# Patient Record
Sex: Male | Born: 1945
Health system: Southern US, Community
[De-identification: ages and names within clinical notes are randomized; demographics above are authoritative.]

## PROBLEM LIST (undated history)

## (undated) DIAGNOSIS — N261 Atrophy of kidney (terminal): Secondary | ICD-10-CM

## (undated) DIAGNOSIS — N200 Calculus of kidney: Secondary | ICD-10-CM

## (undated) DIAGNOSIS — K429 Umbilical hernia without obstruction or gangrene: Secondary | ICD-10-CM

## (undated) DIAGNOSIS — E785 Hyperlipidemia, unspecified: Secondary | ICD-10-CM

## (undated) DIAGNOSIS — I10 Essential (primary) hypertension: Secondary | ICD-10-CM

## (undated) DIAGNOSIS — G4700 Insomnia, unspecified: Secondary | ICD-10-CM

## (undated) DIAGNOSIS — R7303 Prediabetes: Secondary | ICD-10-CM

## (undated) DIAGNOSIS — J449 Chronic obstructive pulmonary disease, unspecified: Secondary | ICD-10-CM

## (undated) DIAGNOSIS — I700 Atherosclerosis of aorta: Secondary | ICD-10-CM

## (undated) DIAGNOSIS — K439 Ventral hernia without obstruction or gangrene: Secondary | ICD-10-CM

## (undated) HISTORY — DX: Prediabetes: R73.03

## (undated) HISTORY — PX: CATARACT EXTRACTION, BILATERAL: SHX1313

## (undated) HISTORY — PX: TONSILLECTOMY AND ADENOIDECTOMY: SUR1326

## (undated) HISTORY — PX: US ECHOCARDIOGRAPHY: HXRAD669

## (undated) HISTORY — DX: Hyperlipidemia, unspecified: E78.5

## (undated) HISTORY — DX: Essential (primary) hypertension: I10

## (undated) HISTORY — DX: Chronic obstructive pulmonary disease, unspecified: J44.9

## (undated) HISTORY — PX: VASECTOMY: SHX75

---

## 2007-07-20 ENCOUNTER — Other Ambulatory Visit: Payer: Self-pay

## 2007-07-20 ENCOUNTER — Inpatient Hospital Stay: Payer: Self-pay | Admitting: Internal Medicine

## 2008-01-25 ENCOUNTER — Ambulatory Visit: Payer: Self-pay | Admitting: Internal Medicine

## 2009-11-01 IMAGING — CT CT CHEST W/ CM
1 series · 16 of 33 positions shown, 20 images · IV contrast (agent unspecified)
Comparison: none

REASON FOR EXAM: Follow-up hemoptysis
COMMENTS:

PROCEDURE:     CT  - CT CHEST WITH CONTRAST  - January 25, 2008  [DATE]
RESULT:     The patient has a history of hemoptysis.
TECHNIQUE: IV contrast enhanced chest CT is obtained.
Comparison is made to prior chest CT of 07/23/2007 and 07/20/2007.

[Series 2: soft tissue · axial · 0.75mm/px · z∈[-774,-414]mm · 16 of 78 slices shown, 20 images]
[im 3/78  mediastinal]
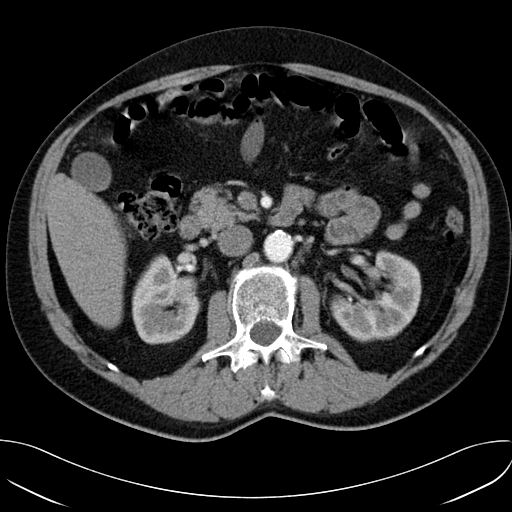
[im 3/78  lung]
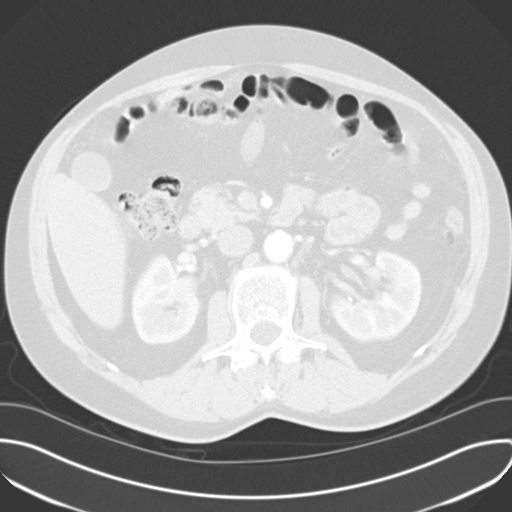
[im 9/78  lung]
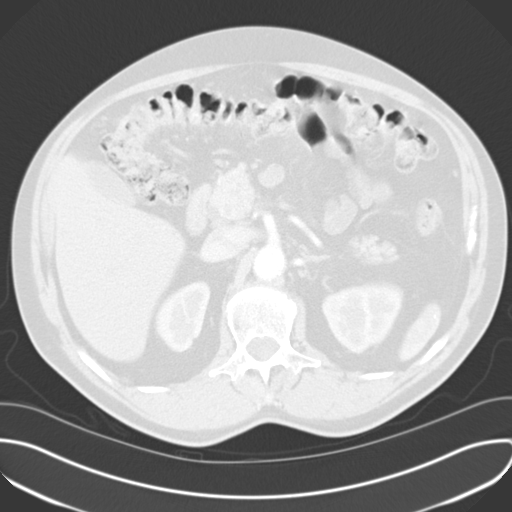
[im 15/78  lung]
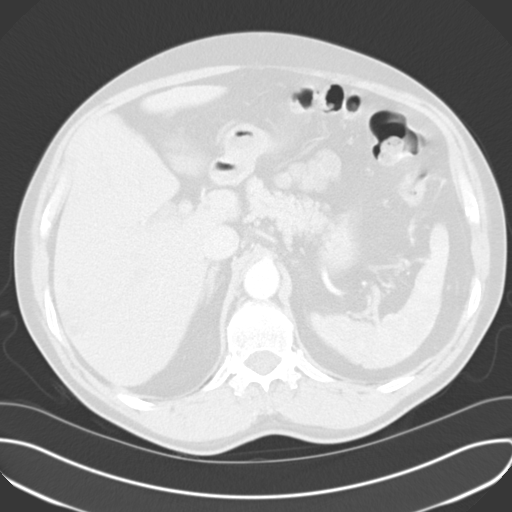
[im 18/78  lung]
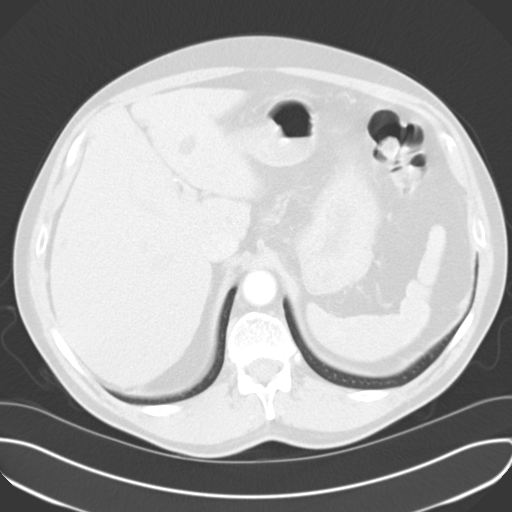
[im 23/78  mediastinal]
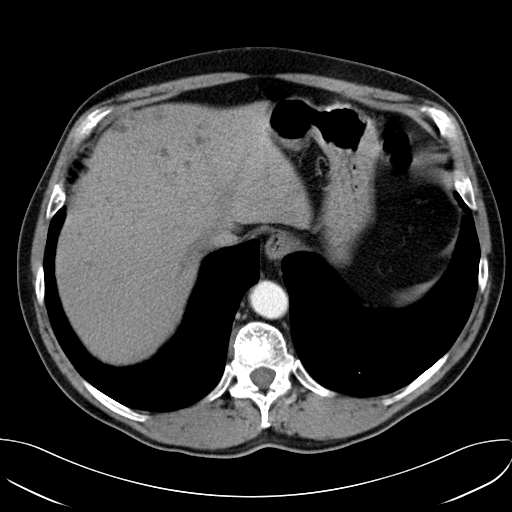
[im 23/78  lung]
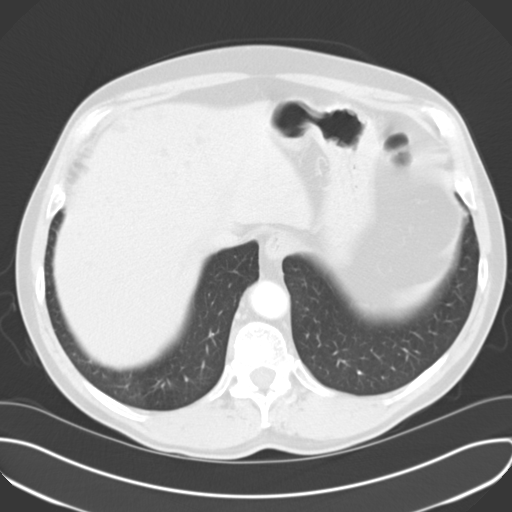
[im 29/78  lung]
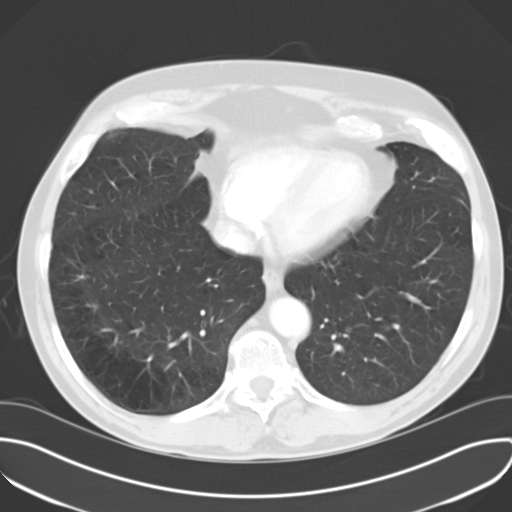
[im 32/78  lung]
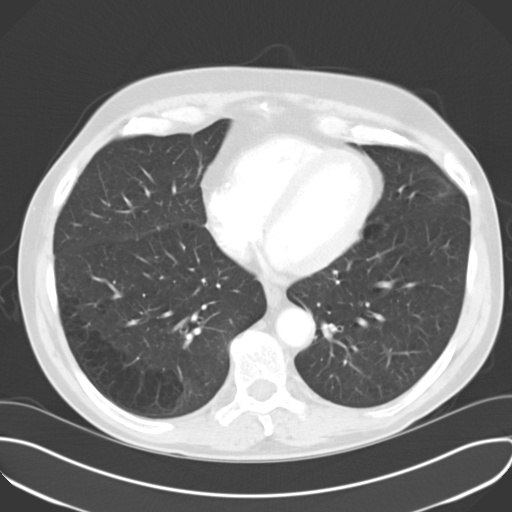
[im 38/78  lung]
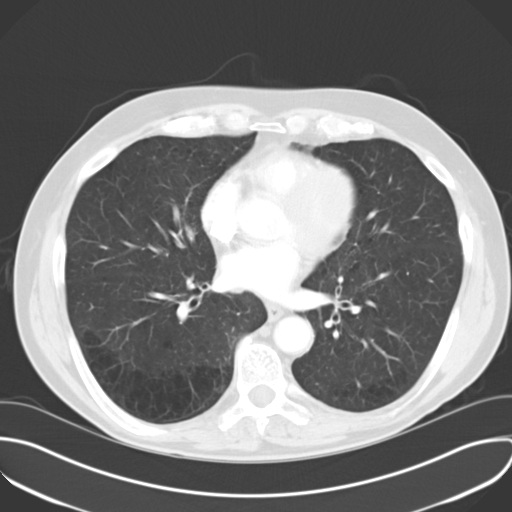
[im 41/78  mediastinal]
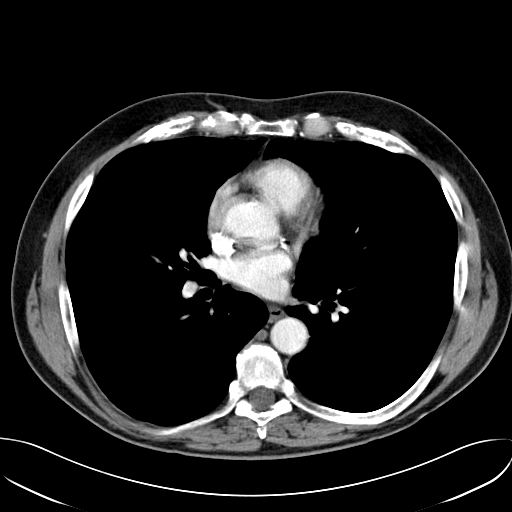
[im 41/78  lung]
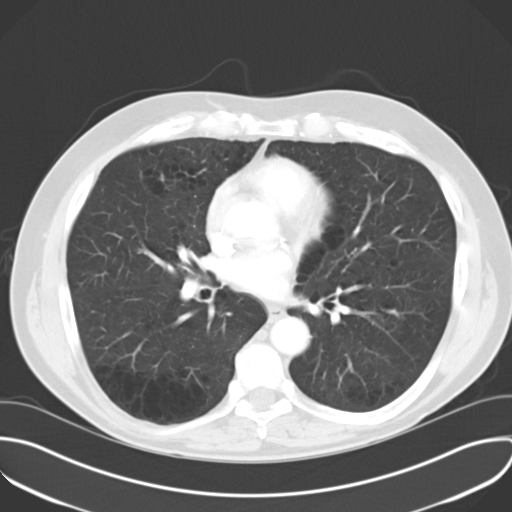
[im 46/78  lung]
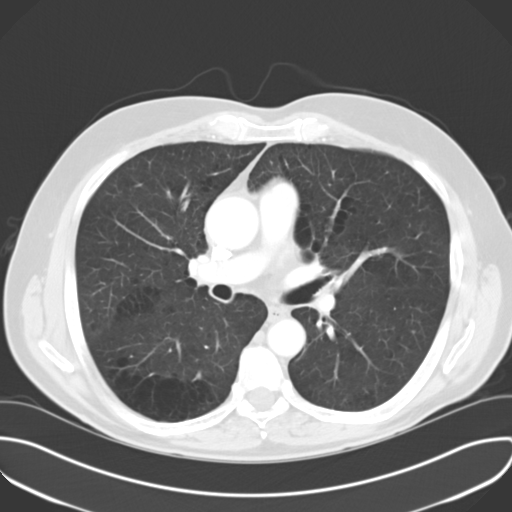
[im 49/78  lung]
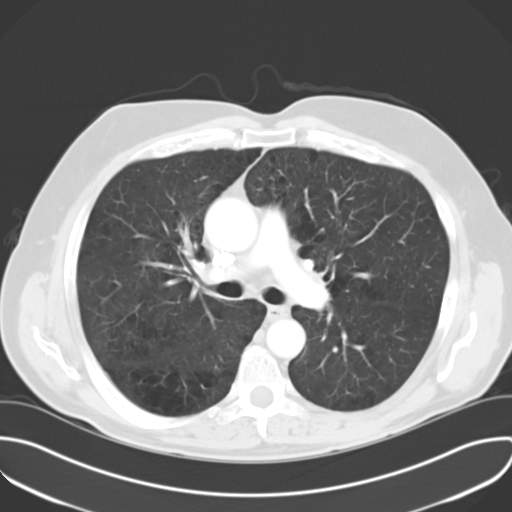
[im 55/78  lung]
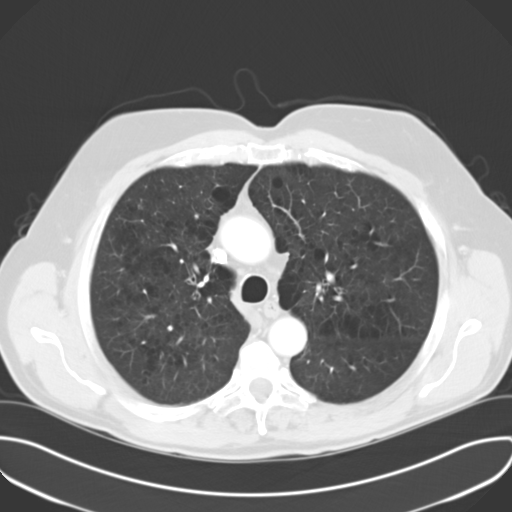
[im 60/78  mediastinal]
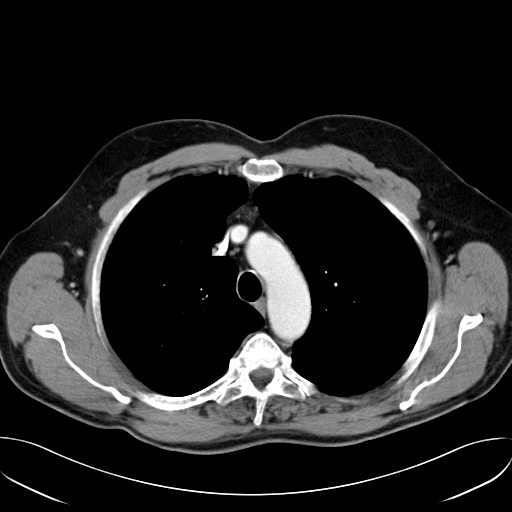
[im 60/78  lung]
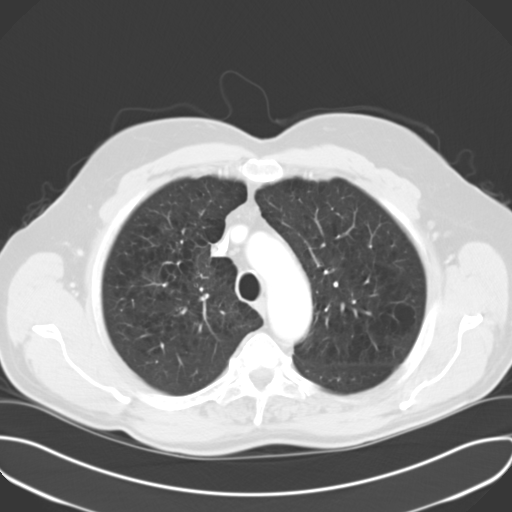
[im 63/78  lung]
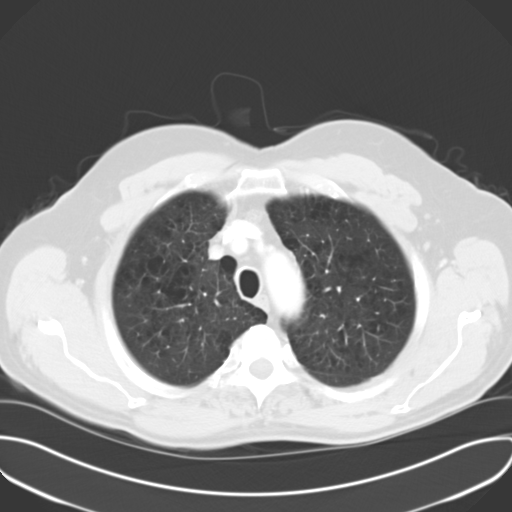
[im 69/78  lung]
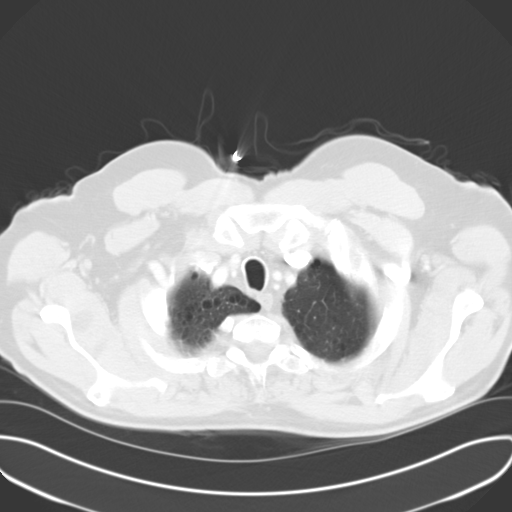
[im 75/78  lung]
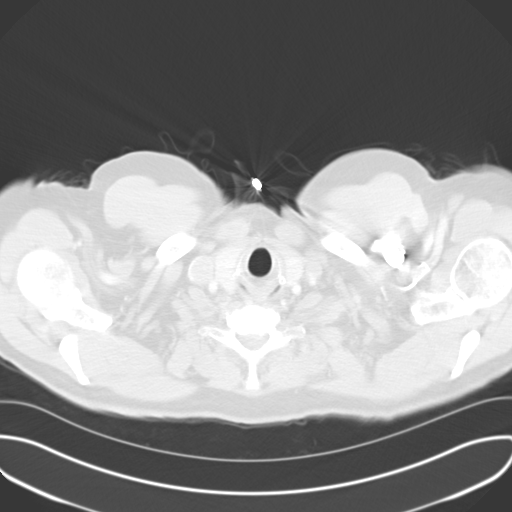

[16 of 33 positions shown; findings below may reference images not displayed]

FINDINGS: The thoracic aorta is unremarkable. Coronary artery disease is
present. The adrenals are normal. Innumerable, hepatic lucencies are again
noted consistent with simple cysts and these are stable. The pulmonary
arteries are normal. There is no evidence of pulmonary embolus. Bullous
changes of COPD are noted. No focal pulmonary mass lesion is noted. Apical
pleural thickening is again noted consistent with scarring. These changes
are stable.
IMPRESSION: 1.     Bullous COPD with apical pleural parenchymal thickening consistent
with scarring and unchanged from prior CT scans in July 2007.
2.     No acute abnormality identified.
3.     Multiple, tiny hepatic cysts are noted.

## 2014-10-11 ENCOUNTER — Ambulatory Visit
Admit: 2014-10-11 | Disposition: A | Discharge status: Home / Self Care | Payer: Self-pay | Attending: Ophthalmology | Admitting: Ophthalmology

## 2014-10-15 ENCOUNTER — Ambulatory Visit
Admit: 2014-10-15 | Disposition: A | Discharge status: Home / Self Care | Payer: Self-pay | Attending: Ophthalmology | Admitting: Ophthalmology

## 2014-10-29 ENCOUNTER — Ambulatory Visit
Admit: 2014-10-29 | Disposition: A | Discharge status: Home / Self Care | Payer: Self-pay | Attending: Ophthalmology | Admitting: Ophthalmology

## 2014-11-11 NOTE — Op Note (Signed)
PATIENT NAME:  Jeff Ryan, Jeff Ryan MR#:  [id number] DATE OF BIRTH:  1945/10/02  DATE OF PROCEDURE:  10/29/2014  PREOPERATIVE DIAGNOSIS:  Nuclear sclerotic cataract, left eye.    POSTOPERATIVE DIAGNOSIS:  Nuclear sclerotic cataract, left eye.    PROCEDURE PERFORMED:  Extracapsular cataract extraction using phacoemulsification with placement of an Alcon SN60WS 20.5-diopter posterior chamber lens, serial number [id number].  SURGEON:  Loura Back. Preciosa Bundrick, MD  ASSISTANT:  None.  ANESTHESIA:  4% lidocaine and 0.75% Marcaine in a 50/50 mixture with 10 units per mL of Hylenex added, given as a peribulbar.  ANESTHESIOLOGIST:  Dr. Marcello Moores.    COMPLICATIONS:  None.  ESTIMATED BLOOD LOSS:  Less than 1 ml.  DESCRIPTION OF PROCEDURE:  The patient was brought to the operating room and given a peribulbar block.  The patient was then prepped and draped in the usual fashion.  The vertical rectus muscles were imbricated using 5-0 silk sutures.  These sutures were then clamped to the sterile drapes as bridle sutures.  A limbal peritomy was performed extending two clock hours and hemostasis was obtained with cautery.  A partial thickness scleral groove was made at the surgical limbus and dissected anteriorly in a lamellar dissection using an Alcon crescent knife.  The anterior chamber was entered supero-temporally with a Superblade and through the lamellar dissection with a 2.6 mm keratome.  DisCoVisc was used to replace the aqueous and a continuous tear capsulorrhexis was carried out.  Hydrodissection and hydrodelineation were carried out with balanced salt and a 27 gauge canula.  The nucleus was rotated to confirm the effectiveness of the hydrodissection.  Phacoemulsification was carried out using a divide-and-conquer technique.  Total ultrasound time was 1 minute and 6.2 seconds with an average power of 26.9 percent. CDE of 29.83.    Irrigation/aspiration was used to remove the residual cortex.  DisCoVisc  was used to inflate the capsule and the internal incision was enlarged to 3 mm with the crescent knife.  The intraocular lens was folded and inserted into the capsular bag using the AcrySert delivery system.  Irrigation/aspiration was used to remove the residual DisCoVisc.  Miostat was injected into the anterior chamber through the paracentesis track to inflate the anterior chamber and induce miosis.  0.1 mL of cefuroxime containing 1 mL of drug was injected via the paracentesis track.  The wound was checked for leaks and none were found. The conjunctiva was closed with cautery and the bridle sutures were removed.  Two drops of 0.3% Vigamox were placed on the eye.   An eye shield was placed on the eye.  The patient was discharged to the recovery room in good condition.   ____________________________ Loura Back Jaree Trinka, MD sad:bu D: 10/29/2014 13:11:44 ET T: 10/29/2014 17:42:04 ET JOB#: [id number]  cc: Remo Lipps A. Lidiya Reise, MD, <Dictator> Martie Lee MD ELECTRONICALLY SIGNED 11/05/2014 13:51

## 2014-11-11 NOTE — Op Note (Signed)
PATIENT NAME:  Jeff Ryan, Jeff Ryan MR#:  [id number] DATE OF BIRTH:  03-11-1946  DATE OF PROCEDURE:  10/15/2014  PREOPERATIVE DIAGNOSIS: Nuclear sclerotic cataract, right eye.   POSTOPERATIVE DIAGNOSIS: Nuclear sclerotic cataract, right eye.   PROCEDURE PERFORMED: Extracapsular cataract extraction using phacoemulsification with placement of Alcon SN6CWS, 21.0 diopter, posterior chamber lens, serial number [id number].  SURGEON: Loura Back. Evanthia Maund, M.D.   ANESTHESIA: 4% lidocaine and 0.75% Marcaine 50-50 mixture with 10 units/mL of Hylenex added given as a peribulbar.   ANESTHESIOLOGIST: Gjibertus F. [place] [employer], M.D.   COMPLICATIONS: None.   ESTIMATED BLOOD LOSS: Less than 1 mL.   DESCRIPTION OF PROCEDURE:  The patient was brought to the operating room and given a peribulbar block.  The patient was then prepped and draped in the usual fashion.  The vertical rectus muscles were imbricated using 5-0 silk sutures.  These sutures were then clamped to the sterile drapes as bridle sutures.  A limbal peritomy was performed extending two clock hours and hemostasis was obtained with cautery.  A partial thickness scleral groove was made at the surgical limbus and dissected anteriorly in a lamellar dissection using an Alcon crescent knife.  The anterior chamber was entered superonasally with a Superblade and through the lamellar dissection with a 2.6 mm keratome.  DisCoVisc was used to replace the aqueous and a continuous tear capsulorrhexis was carried out.  Hydrodissection and hydrodelineation were carried out with balanced salt and a 27 gauge canula.  The nucleus was rotated to confirm the effectiveness of the hydrodissection.  Phacoemulsification was carried out using a divide-and-conquer technique.  Total ultrasound time 1 minute and 44 seconds with an average power of 28.1 percent.  CDE 48.33.    Irrigation/aspiration was used to remove the residual cortex.  DisCoVisc was used to inflate the  capsule and the internal incision was enlarged to 3 mm with the crescent knife.  The intraocular lens was folded and inserted into the capsular bag using the AcrySert delivery system.  Irrigation/aspiration was used to remove the residual DisCoVisc.  Miostat was injected into the anterior chamber through the paracentesis track to inflate the anterior chamber and induce miosis.  1/10 of a mL of cefuroxime containing 1 mg of drug was injected via paracentesis tract. The wound was checked for leaks and none were found. The conjunctiva was closed with cautery and the bridle sutures were removed. An eye shield was placed on the eye.  The patient was discharged to the recovery room in good condition.   __________________________ Loura Back Andraya Frigon, MD sad:sp D: 10/15/2014 09:36:52 ET T: 10/15/2014 10:09:26 ET JOB#: [id number]  cc: Remo Lipps A. Tiera Mensinger, MD, <Dictator> Martie Lee MD ELECTRONICALLY SIGNED 10/22/2014 14:07

## 2015-04-12 ENCOUNTER — Encounter: Payer: Self-pay | Admitting: Family Medicine

## 2015-04-15 ENCOUNTER — Ambulatory Visit (INDEPENDENT_AMBULATORY_CARE_PROVIDER_SITE_OTHER): Payer: Medicare PPO | Admitting: Family Medicine

## 2015-04-15 ENCOUNTER — Encounter: Payer: Self-pay | Admitting: Family Medicine

## 2015-04-15 VITALS — BP 135/70 | HR 60 | Temp 97.5°F | Resp 16 | Ht 71.0 in | Wt 161.0 lb

## 2015-04-15 DIAGNOSIS — I129 Hypertensive chronic kidney disease with stage 1 through stage 4 chronic kidney disease, or unspecified chronic kidney disease: Secondary | ICD-10-CM | POA: Insufficient documentation

## 2015-04-15 DIAGNOSIS — E785 Hyperlipidemia, unspecified: Secondary | ICD-10-CM | POA: Insufficient documentation

## 2015-04-15 DIAGNOSIS — E119 Type 2 diabetes mellitus without complications: Secondary | ICD-10-CM

## 2015-04-15 DIAGNOSIS — I10 Essential (primary) hypertension: Secondary | ICD-10-CM | POA: Diagnosis not present

## 2015-04-15 DIAGNOSIS — Z23 Encounter for immunization: Secondary | ICD-10-CM

## 2015-04-15 LAB — CBC WITH DIFFERENTIAL/PLATELET
BASOS ABS: 0 10*3/uL (ref 0.0–0.2)
Basos: 1 %
EOS (ABSOLUTE): 0.1 10*3/uL (ref 0.0–0.4)
Eos: 1 %
Hematocrit: 40.2 % (ref 37.5–51.0)
Hemoglobin: 13.3 g/dL (ref 12.6–17.7)
Immature Grans (Abs): 0 10*3/uL (ref 0.0–0.1)
Immature Granulocytes: 0 %
LYMPHS ABS: 2.3 10*3/uL (ref 0.7–3.1)
Lymphs: 34 %
MCH: 31.3 pg (ref 26.6–33.0)
MCHC: 33.1 g/dL (ref 31.5–35.7)
MCV: 95 fL (ref 79–97)
MONOS ABS: 0.5 10*3/uL (ref 0.1–0.9)
Monocytes: 8 %
NEUTROS ABS: 3.9 10*3/uL (ref 1.4–7.0)
Neutrophils: 56 %
PLATELETS: 256 10*3/uL (ref 150–379)
RBC: 4.25 x10E6/uL (ref 4.14–5.80)
RDW: 14.5 % (ref 12.3–15.4)
WBC: 6.9 10*3/uL (ref 3.4–10.8)

## 2015-04-15 LAB — POCT GLYCOSYLATED HEMOGLOBIN (HGB A1C)

## 2015-04-15 NOTE — Patient Instructions (Signed)
Continue current meds 

## 2015-04-15 NOTE — Progress Notes (Signed)
Name: Jeff Ryan   MRN: [id number]    DOB: 07-Mar-1946   Date:04/15/2015       Progress Note  Subjective  Chief Complaint  Chief Complaint  Patient presents with  . Hypertension  . Diabetes  . Hyperlipidemia    HPI Here for f/u of diet controlled DM, HBP, hypercholesterol.  Takng meds   Eating well controlled diet, mainly vgetables.  No c/o.  No problem-specific assessment & plan notes found for this encounter.   Past Medical History  Diagnosis Date  . COPD (chronic obstructive pulmonary disease) ([place])   . Dyslipidemia   . Essential hypertension   . Pre-diabetes     Social History  Substance Use Topics  . Smoking status: Former Smoker    Types: Cigarettes  . Smokeless tobacco: Never Used  . Alcohol Use: 0.0 oz/week    0 Standard drinks or equivalent per week     Comment: beer x2 weekly     Current outpatient prescriptions:  .  lisinopril (PRINIVIL,ZESTRIL) 40 MG tablet, Take 40 mg by mouth daily., Disp: , Rfl:  .  simvastatin (ZOCOR) 40 MG tablet, Take 40 mg by mouth daily., Disp: , Rfl:   No Known Allergies  Review of Systems  Constitutional: Negative for fever and chills.  HENT: Negative for hearing loss.   Eyes: Negative for blurred vision and double vision.  Respiratory: Negative for cough, shortness of breath and wheezing.   Cardiovascular: Negative for chest pain, palpitations and leg swelling.  Gastrointestinal: Negative for heartburn, abdominal pain and blood in stool.  Genitourinary: Negative for dysuria, urgency and frequency.  Musculoskeletal: Negative for myalgias and joint pain.  Skin: Negative for rash.  Neurological: Negative for dizziness, sensory change, focal weakness and headaches.      Objective  Filed Vitals:   04/15/15 0828  BP: 147/83  Pulse: 48  Temp: 97.5 F (36.4 C)  TempSrc: Oral  Resp: 16  Height: 5\' 11"  (1.803 m)  Weight: 161 lb (73.029 kg)     Physical Exam  Constitutional: He is oriented to person, place,  and time. No distress.  HENT:  Head: Normocephalic and atraumatic.  Eyes: Conjunctivae and EOM are normal. Pupils are equal, round, and reactive to light. No scleral icterus.  Neck: Normal range of motion. Neck supple. Carotid bruit is not present. No thyromegaly present.  Cardiovascular: Normal rate, regular rhythm, normal heart sounds and intact distal pulses.  Exam reveals no gallop and no friction rub.   No murmur heard. Pulmonary/Chest: Effort normal and breath sounds normal. No respiratory distress. He has no wheezes. He has no rales.  Abdominal: Soft. Bowel sounds are normal. He exhibits no distension, no abdominal bruit and no mass. There is no tenderness.  Musculoskeletal: Normal range of motion. He exhibits no edema.  Lymphadenopathy:    He has no cervical adenopathy.  Neurological: He is alert and oriented to person, place, and time.  Vitals reviewed.     Recent Results (from the past 2160 hour(s))  POCT HgB A1C     Status: Normal   Collection Time: 04/15/15  8:58 AM  Result Value Ref Range   Hemoglobin A1C 5.4%      Assessment & Plan  1. Type 2 diabetes mellitus without complication, without long-term current use of insulin (HCC) -diet controlled  - POCT HgB A1C -5.4 - Comprehensive Metabolic Panel (CMET) - CBC with Differential  2. Need for influenza vaccination  - Flu vaccine HIGH DOSE PF (Fluzone High dose)  3.  Type 2 diabetes, diet controlled ([place])   4. Essential hypertension   5. Hyperlipidemia  - Lipid Profile

## 2015-04-16 LAB — COMPREHENSIVE METABOLIC PANEL
A/G RATIO: 1.7 (ref 1.1–2.5)
ALT: 17 IU/L (ref 0–44)
AST: 21 IU/L (ref 0–40)
Albumin: 4.3 g/dL (ref 3.6–4.8)
Alkaline Phosphatase: 51 IU/L (ref 39–117)
BILIRUBIN TOTAL: 0.4 mg/dL (ref 0.0–1.2)
BUN/Creatinine Ratio: 26 — ABNORMAL HIGH (ref 10–22)
BUN: 19 mg/dL (ref 8–27)
CHLORIDE: 101 mmol/L (ref 97–108)
CO2: 27 mmol/L (ref 18–29)
Calcium: 9.1 mg/dL (ref 8.6–10.2)
Creatinine, Ser: 0.74 mg/dL — ABNORMAL LOW (ref 0.76–1.27)
GFR calc non Af Amer: 94 mL/min/{1.73_m2} (ref >59)
GFR, EST AFRICAN AMERICAN: 109 mL/min/{1.73_m2} (ref >59)
GLUCOSE: 102 mg/dL — AB (ref 65–99)
Globulin, Total: 2.6 g/dL (ref 1.5–4.5)
Potassium: 5.1 mmol/L (ref 3.5–5.2)
Sodium: 140 mmol/L (ref 134–144)
TOTAL PROTEIN: 6.9 g/dL (ref 6.0–8.5)

## 2015-04-16 LAB — LIPID PANEL
Chol/HDL Ratio: 2.3 ratio units (ref 0.0–5.0)
Cholesterol, Total: 144 mg/dL (ref 100–199)
HDL: 63 mg/dL (ref >39)
LDL Calculated: 70 mg/dL (ref 0–99)
TRIGLYCERIDES: 57 mg/dL (ref 0–149)
VLDL Cholesterol Cal: 11 mg/dL (ref 5–40)

## 2015-08-19 ENCOUNTER — Encounter: Payer: Self-pay | Admitting: Family Medicine

## 2015-08-19 ENCOUNTER — Ambulatory Visit (INDEPENDENT_AMBULATORY_CARE_PROVIDER_SITE_OTHER): Payer: Medicare Other | Admitting: Family Medicine

## 2015-08-19 VITALS — BP 135/70 | HR 49 | Temp 97.6°F | Resp 16 | Ht 71.0 in | Wt 164.4 lb

## 2015-08-19 DIAGNOSIS — I10 Essential (primary) hypertension: Secondary | ICD-10-CM

## 2015-08-19 DIAGNOSIS — E785 Hyperlipidemia, unspecified: Secondary | ICD-10-CM | POA: Diagnosis not present

## 2015-08-19 DIAGNOSIS — R7303 Prediabetes: Secondary | ICD-10-CM

## 2015-08-19 DIAGNOSIS — E119 Type 2 diabetes mellitus without complications: Secondary | ICD-10-CM

## 2015-08-19 LAB — POCT GLYCOSYLATED HEMOGLOBIN (HGB A1C): HEMOGLOBIN A1C: 5.5

## 2015-08-19 MED ORDER — SIMVASTATIN 40 MG PO TABS: 40.0000 mg | ORAL_TABLET | Freq: Every day | ORAL | Status: DC

## 2015-08-19 MED ORDER — LISINOPRIL 40 MG PO TABS: 40.0000 mg | ORAL_TABLET | Freq: Every day | ORAL | Status: DC

## 2015-08-19 NOTE — Progress Notes (Signed)
Name: Jeff Ryan   MRN: [id number]    DOB: 06/02/1946   Date:08/19/2015       Progress Note  Subjective  Chief Complaint  Chief Complaint  Patient presents with  . Hypertension    HPI  Here for f/u of HBP.  Has elevated lipios.  Has diet controlled DM.  He feels well without C/o. No problem-specific assessment & plan notes found for this encounter.   Past Medical History  Diagnosis Date  . COPD (chronic obstructive pulmonary disease) ([place])   . Dyslipidemia   . Essential hypertension   . Pre-diabetes     Past Surgical History  Procedure Laterality Date  . Tonsillectomy and adenoidectomy    . Vasectomy    . Cataract extraction, bilateral      left 10/26/2014 right 10/12/2014  . US echocardiography      10/2014    Family History  Problem Relation Age of Onset  . Heart disease Brother     Social History   Social History  . Marital Status: Married    Spouse Name: N/A  . Number of Children: N/A  . Years of Education: N/A   Occupational History  . Not on file.   Social History Main Topics  . Smoking status: Former Smoker    Types: Cigarettes  . Smokeless tobacco: Never Used  . Alcohol Use: 0.0 oz/week    0 Standard drinks or equivalent per week     Comment: beer x2 weekly  . Drug Use: No  . Sexual Activity: Not on file   Other Topics Concern  . Not on file   Social History Narrative     Current outpatient prescriptions:  .  lisinopril (PRINIVIL,ZESTRIL) 40 MG tablet, Take 40 mg by mouth daily., Disp: , Rfl:  .  simvastatin (ZOCOR) 40 MG tablet, Take 40 mg by mouth daily., Disp: , Rfl:   No Known Allergies   Review of Systems  Constitutional: Negative for fever, chills, weight loss and malaise/fatigue.  HENT: Negative for hearing loss.   Eyes: Negative for blurred vision and double vision.  Respiratory: Negative for cough, shortness of breath and wheezing.   Cardiovascular: Negative for chest pain, palpitations and leg swelling.   Gastrointestinal: Negative for heartburn, abdominal pain and blood in stool.  Genitourinary: Negative for dysuria, urgency and frequency.  Musculoskeletal: Negative for joint pain (bilateral shoulders and L knee).  Skin: Negative for rash.  Neurological: Negative for dizziness, tremors, weakness and headaches.      Objective  Filed Vitals:   08/19/15 0814 08/19/15 0844  BP: 162/88 135/70  Pulse: 49   Temp: 97.6 F (36.4 C)   TempSrc: Oral   Resp: 16   Height: 5\' 11"  (1.803 m)   Weight: 164 lb 6.4 oz (74.571 kg)   SpO2: 100%     Physical Exam  Constitutional: He is oriented to person, place, and time and well-developed, well-nourished, and in no distress. No distress.  HENT:  Head: Normocephalic and atraumatic.  Eyes: Conjunctivae and EOM are normal. Pupils are equal, round, and reactive to light. No scleral icterus.  Neck: Normal range of motion. Neck supple. Carotid bruit is not present. No thyromegaly present.  Cardiovascular: Normal rate, regular rhythm and normal heart sounds.  Exam reveals no gallop and no friction rub.   No murmur heard. Pulmonary/Chest: Effort normal and breath sounds normal. No respiratory distress. He has no wheezes. He has no rales.  Abdominal: Soft. Bowel sounds are normal. He exhibits no distension,  no abdominal bruit and no mass. There is no tenderness.  Musculoskeletal: He exhibits no edema.  Lymphadenopathy:    He has no cervical adenopathy.  Neurological: He is alert and oriented to person, place, and time.  Vitals reviewed.      Recent Results (from the past 2160 hour(s))  POCT HgB A1C     Status: Normal   Collection Time: 08/19/15  8:32 AM  Result Value Ref Range   Hemoglobin A1C 5.5      Assessment & Plan  Problem List Items Addressed This Visit      Cardiovascular and Mediastinum   HBP (high blood pressure)     Endocrine   Type 2 diabetes, diet controlled ([place])     Other   Hyperlipidemia    Other Visit Diagnoses     Prediabetes    -  Primary    Relevant Orders    POCT HgB A1C (Completed)       No orders of the defined types were placed in this encounter.   1. Prediabetes  - POCT HgB A1C-5.5  2. Essential hypertension -cont med  3. Type 2 diabetes, diet controlled (HCC) Cont. Diet  4. Hyperlipidemia  Cont med

## 2016-02-17 ENCOUNTER — Ambulatory Visit (INDEPENDENT_AMBULATORY_CARE_PROVIDER_SITE_OTHER): Payer: Medicare Other | Admitting: Family Medicine

## 2016-02-17 ENCOUNTER — Encounter: Payer: Self-pay | Admitting: Family Medicine

## 2016-02-17 VITALS — BP 140/65 | HR 54 | Temp 98.3°F | Ht 71.0 in | Wt 172.0 lb

## 2016-02-17 DIAGNOSIS — E785 Hyperlipidemia, unspecified: Secondary | ICD-10-CM | POA: Diagnosis not present

## 2016-02-17 DIAGNOSIS — I10 Essential (primary) hypertension: Secondary | ICD-10-CM | POA: Diagnosis not present

## 2016-02-17 LAB — CBC WITH DIFFERENTIAL/PLATELET
Basophils Absolute: 54 cells/uL (ref 0–200)
Basophils Relative: 1 %
EOS PCT: 1 %
Eosinophils Absolute: 54 cells/uL (ref 15–500)
HEMATOCRIT: 40.2 % (ref 38.5–50.0)
Hemoglobin: 13.2 g/dL (ref 13.2–17.1)
LYMPHS ABS: 2052 {cells}/uL (ref 850–3900)
LYMPHS PCT: 38 %
MCH: 31.7 pg (ref 27.0–33.0)
MCHC: 32.8 g/dL (ref 32.0–36.0)
MCV: 96.4 fL (ref 80.0–100.0)
MONO ABS: 486 {cells}/uL (ref 200–950)
MPV: 11 fL (ref 7.5–12.5)
Monocytes Relative: 9 %
Neutro Abs: 2754 cells/uL (ref 1500–7800)
Neutrophils Relative %: 51 %
Platelets: 270 10*3/uL (ref 140–400)
RBC: 4.17 MIL/uL — AB (ref 4.20–5.80)
RDW: 13.9 % (ref 11.0–15.0)
WBC: 5.4 10*3/uL (ref 3.8–10.8)

## 2016-02-17 MED ORDER — SIMVASTATIN 40 MG PO TABS: 40.0000 mg | ORAL_TABLET | Freq: Every day | ORAL | 3 refills | Status: DC

## 2016-02-17 MED ORDER — LISINOPRIL 40 MG PO TABS: 40.0000 mg | ORAL_TABLET | Freq: Every day | ORAL | 3 refills | Status: DC

## 2016-02-17 NOTE — Progress Notes (Signed)
Name: Jeff Ryan   MRN: [id number]    DOB: 04/20/1946   Date:02/17/2016       Progress Note  Subjective  Chief Complaint  Chief Complaint  Patient presents with  . Hypertension  . Hyperlipidemia    HPI Here for f/u of HBP and elevated lipids.  BPs at home 120-125/70-75.  He feels well.  Very active.    No problem-specific Assessment & Plan notes found for this encounter.   Past Medical History:  Diagnosis Date  . COPD (chronic obstructive pulmonary disease) ([place])   . Dyslipidemia   . Essential hypertension   . Pre-diabetes     Past Surgical History:  Procedure Laterality Date  . CATARACT EXTRACTION, BILATERAL     left 10/26/2014 right 10/12/2014  . TONSILLECTOMY AND ADENOIDECTOMY    . US ECHOCARDIOGRAPHY     10/2014  . VASECTOMY      Family History  Problem Relation Age of Onset  . Heart disease Brother     Social History   Social History  . Marital status: Married    Spouse name: N/A  . Number of children: N/A  . Years of education: N/A   Occupational History  . Not on file.   Social History Main Topics  . Smoking status: Former Smoker    Types: Cigarettes  . Smokeless tobacco: Never Used  . Alcohol use 0.0 oz/week     Comment: beer x2 weekly  . Drug use: No  . Sexual activity: Not on file   Other Topics Concern  . Not on file   Social History Narrative  . No narrative on file     Current Outpatient Prescriptions:  .  lisinopril (PRINIVIL,ZESTRIL) 40 MG tablet, Take 1 tablet (40 mg total) by mouth daily., Disp: 90 tablet, Rfl: 3 .  simvastatin (ZOCOR) 40 MG tablet, Take 1 tablet (40 mg total) by mouth daily., Disp: 90 tablet, Rfl: 3  Not on File   Review of Systems  Constitutional: Negative for chills, fever, malaise/fatigue and weight loss.  HENT: Negative for hearing loss.   Eyes: Negative for blurred vision and double vision.  Respiratory: Negative for cough, shortness of breath and wheezing.   Cardiovascular: Negative for chest  pain, palpitations and leg swelling.  Gastrointestinal: Negative for abdominal pain, blood in stool and heartburn.  Genitourinary: Negative for dysuria, frequency and urgency.  Musculoskeletal: Negative for joint pain and myalgias.  Skin: Negative for itching and rash.  Neurological: Negative for dizziness, tremors, weakness and headaches.  Psychiatric/Behavioral: Negative for depression. The patient is not nervous/anxious and does not have insomnia.       Objective  Vitals:   02/17/16 0813 02/17/16 0835  BP: (!) 161/91 140/65  Pulse: (!) 54   Temp: 98.3 F (36.8 C)   TempSrc: Oral   Weight: 172 lb (78 kg)   Height: 5\' 11"  (1.803 m)     Physical Exam  Constitutional: He is oriented to person, place, and time and well-developed, well-nourished, and in no distress. No distress.  HENT:  Head: Normocephalic and atraumatic.  Eyes: Conjunctivae and EOM are normal. Pupils are equal, round, and reactive to light. No scleral icterus.  Neck: Normal range of motion. Neck supple. Carotid bruit is not present. No thyromegaly present.  Cardiovascular: Normal rate, regular rhythm and normal heart sounds.  Exam reveals no gallop and no friction rub.   No murmur heard. Pulmonary/Chest: Effort normal and breath sounds normal. No respiratory distress. He has no wheezes. He has  no rales.  Abdominal: Soft. Bowel sounds are normal. He exhibits no distension, no abdominal bruit and no mass. There is no tenderness.  Musculoskeletal: He exhibits no edema.  Lymphadenopathy:    He has no cervical adenopathy.  Neurological: He is alert and oriented to person, place, and time.  Vitals reviewed.      No results found for this or any previous visit (from the past 2160 hour(s)).   Assessment & Plan  Problem List Items Addressed This Visit      Cardiovascular and Mediastinum   HBP (high blood pressure) - Primary   Relevant Medications   simvastatin (ZOCOR) 40 MG tablet   lisinopril  (PRINIVIL,ZESTRIL) 40 MG tablet   Other Relevant Orders   COMPLETE METABOLIC PANEL WITH GFR   CBC with Differential     Other   Hyperlipidemia   Relevant Medications   simvastatin (ZOCOR) 40 MG tablet   lisinopril (PRINIVIL,ZESTRIL) 40 MG tablet   Other Relevant Orders   Lipid Profile    Other Visit Diagnoses   None.     Meds ordered this encounter  Medications  . simvastatin (ZOCOR) 40 MG tablet    Sig: Take 1 tablet (40 mg total) by mouth daily.    Dispense:  90 tablet    Refill:  3  . lisinopril (PRINIVIL,ZESTRIL) 40 MG tablet    Sig: Take 1 tablet (40 mg total) by mouth daily.    Dispense:  90 tablet    Refill:  3   1. Hyperlipidemia  - simvastatin (ZOCOR) 40 MG tablet; Take 1 tablet (40 mg total) by mouth daily.  Dispense: 90 tablet; Refill: 3 - Lipid Profile  2. Essential hypertension  - lisinopril (PRINIVIL,ZESTRIL) 40 MG tablet; Take 1 tablet (40 mg total) by mouth daily.  Dispense: 90 tablet; Refill: 3 - COMPLETE METABOLIC PANEL WITH GFR - CBC with Differential

## 2016-02-18 ENCOUNTER — Other Ambulatory Visit: Payer: Self-pay | Admitting: *Deleted

## 2016-02-18 DIAGNOSIS — E785 Hyperlipidemia, unspecified: Secondary | ICD-10-CM

## 2016-02-18 DIAGNOSIS — I10 Essential (primary) hypertension: Secondary | ICD-10-CM

## 2016-02-18 LAB — COMPLETE METABOLIC PANEL WITH GFR
ALBUMIN: 4.1 g/dL (ref 3.6–5.1)
ALK PHOS: 41 U/L (ref 40–115)
ALT: 13 U/L (ref 9–46)
AST: 17 U/L (ref 10–35)
BILIRUBIN TOTAL: 0.6 mg/dL (ref 0.2–1.2)
BUN: 26 mg/dL — AB (ref 7–25)
CO2: 28 mmol/L (ref 20–31)
Calcium: 9.3 mg/dL (ref 8.6–10.3)
Chloride: 106 mmol/L (ref 98–110)
Creat: 0.67 mg/dL — ABNORMAL LOW (ref 0.70–1.18)
GFR, Est African American: >89 mL/min (ref >=60)
GFR, Est Non African American: >89 mL/min (ref >=60)
GLUCOSE: 104 mg/dL — AB (ref 65–99)
Potassium: 5.5 mmol/L — ABNORMAL HIGH (ref 3.5–5.3)
SODIUM: 140 mmol/L (ref 135–146)
Total Protein: 6.3 g/dL (ref 6.1–8.1)

## 2016-02-18 LAB — LIPID PANEL
CHOL/HDL RATIO: 2 ratio (ref <=5.0)
Cholesterol: 145 mg/dL (ref 125–200)
HDL: 74 mg/dL (ref >=40)
LDL CALC: 61 mg/dL (ref <130)
Triglycerides: 50 mg/dL (ref <150)
VLDL: 10 mg/dL (ref <30)

## 2016-02-18 MED ORDER — SIMVASTATIN 40 MG PO TABS: 40.0000 mg | ORAL_TABLET | Freq: Every day | ORAL | 3 refills | Status: DC

## 2016-02-18 MED ORDER — HYDROCHLOROTHIAZIDE 12.5 MG PO TABS: 12.5000 mg | ORAL_TABLET | Freq: Every day | ORAL | 6 refills | Status: DC

## 2016-02-18 MED ORDER — LISINOPRIL 40 MG PO TABS: 40.0000 mg | ORAL_TABLET | Freq: Every day | ORAL | 3 refills | Status: DC

## 2016-02-18 NOTE — Progress Notes (Unsigned)
.  h 

## 2016-03-20 ENCOUNTER — Other Ambulatory Visit: Payer: Medicare PPO

## 2016-03-20 ENCOUNTER — Other Ambulatory Visit: Payer: Self-pay | Admitting: Family Medicine

## 2016-03-20 DIAGNOSIS — R748 Abnormal levels of other serum enzymes: Secondary | ICD-10-CM

## 2016-03-20 DIAGNOSIS — R899 Unspecified abnormal finding in specimens from other organs, systems and tissues: Secondary | ICD-10-CM | POA: Diagnosis not present

## 2016-03-20 LAB — BASIC METABOLIC PANEL
BUN: 22 mg/dL (ref 7–25)
CHLORIDE: 100 mmol/L (ref 98–110)
CO2: 30 mmol/L (ref 20–31)
Calcium: 9.6 mg/dL (ref 8.6–10.3)
Creat: 0.81 mg/dL (ref 0.70–1.18)
GLUCOSE: 109 mg/dL — AB (ref 65–99)
POTASSIUM: 5 mmol/L (ref 3.5–5.3)
SODIUM: 138 mmol/L (ref 135–146)

## 2016-04-09 ENCOUNTER — Ambulatory Visit (INDEPENDENT_AMBULATORY_CARE_PROVIDER_SITE_OTHER): Payer: Medicare Other

## 2016-04-09 DIAGNOSIS — Z23 Encounter for immunization: Secondary | ICD-10-CM | POA: Diagnosis not present

## 2016-07-08 DIAGNOSIS — H40003 Preglaucoma, unspecified, bilateral: Secondary | ICD-10-CM | POA: Diagnosis not present

## 2016-08-20 ENCOUNTER — Ambulatory Visit (INDEPENDENT_AMBULATORY_CARE_PROVIDER_SITE_OTHER): Payer: Medicare Other | Admitting: Family Medicine

## 2016-08-20 ENCOUNTER — Encounter: Payer: Self-pay | Admitting: Family Medicine

## 2016-08-20 ENCOUNTER — Other Ambulatory Visit: Payer: Self-pay | Admitting: Family Medicine

## 2016-08-20 VITALS — BP 120/60 | HR 59 | Temp 98.3°F | Resp 16 | Ht 71.0 in | Wt 175.0 lb

## 2016-08-20 DIAGNOSIS — E7849 Other hyperlipidemia: Secondary | ICD-10-CM

## 2016-08-20 DIAGNOSIS — E782 Mixed hyperlipidemia: Secondary | ICD-10-CM | POA: Diagnosis not present

## 2016-08-20 DIAGNOSIS — E784 Other hyperlipidemia: Secondary | ICD-10-CM

## 2016-08-20 DIAGNOSIS — I10 Essential (primary) hypertension: Secondary | ICD-10-CM | POA: Diagnosis not present

## 2016-08-20 DIAGNOSIS — R739 Hyperglycemia, unspecified: Secondary | ICD-10-CM | POA: Diagnosis not present

## 2016-08-20 MED ORDER — SIMVASTATIN 40 MG PO TABS: 40.0000 mg | ORAL_TABLET | Freq: Every day | ORAL | 3 refills | Status: DC

## 2016-08-20 MED ORDER — LISINOPRIL 40 MG PO TABS: 40.0000 mg | ORAL_TABLET | Freq: Every day | ORAL | 3 refills | Status: DC

## 2016-08-20 NOTE — Addendum Note (Signed)
Addended by: Devona Konig on: 08/20/2016 08:55 AM   Modules accepted: Orders

## 2016-08-20 NOTE — Progress Notes (Signed)
Name: Nigil Barile   MRN: [id number]    DOB: Apr 06, 1946   Date:08/20/2016       Progress Note  Subjective  Chief Complaint  Chief Complaint  Patient presents with  . Hypertension    HPI Here for f/u of HBP and elevated lipids.  He is taking meds.  He feels well and has no c/o.  No problem-specific Assessment & Plan notes found for this encounter.   Past Medical History:  Diagnosis Date  . COPD (chronic obstructive pulmonary disease) ([place])   . Dyslipidemia   . Essential hypertension   . Pre-diabetes     Past Surgical History:  Procedure Laterality Date  . CATARACT EXTRACTION, BILATERAL     left 10/26/2014 right 10/12/2014  . TONSILLECTOMY AND ADENOIDECTOMY    . US ECHOCARDIOGRAPHY     10/2014  . VASECTOMY      Family History  Problem Relation Age of Onset  . Heart disease Brother     Social History   Social History  . Marital status: Married    Spouse name: N/A  . Number of children: N/A  . Years of education: N/A   Occupational History  . Not on file.   Social History Main Topics  . Smoking status: Former Smoker    Types: Cigarettes  . Smokeless tobacco: Never Used  . Alcohol use 0.0 oz/week     Comment: beer x2 weekly  . Drug use: No  . Sexual activity: Not on file   Other Topics Concern  . Not on file   Social History Narrative  . No narrative on file     Current Outpatient Prescriptions:  .  lisinopril (PRINIVIL,ZESTRIL) 40 MG tablet, Take 1 tablet (40 mg total) by mouth daily., Disp: 90 tablet, Rfl: 3 .  simvastatin (ZOCOR) 40 MG tablet, Take 1 tablet (40 mg total) by mouth daily., Disp: 90 tablet, Rfl: 3  Not on File   Review of Systems  Constitutional: Negative for chills, fever, malaise/fatigue and weight loss.  HENT: Negative for hearing loss and tinnitus.   Eyes: Negative for blurred vision and double vision.  Respiratory: Negative for cough, shortness of breath and wheezing.   Cardiovascular: Negative for chest pain,  palpitations and leg swelling.  Gastrointestinal: Negative for abdominal pain, blood in stool and heartburn.  Genitourinary: Negative for dysuria, frequency and urgency.  Musculoskeletal: Negative for joint pain and myalgias.  Skin: Negative for rash.  Neurological: Negative for dizziness, tingling, weakness and headaches.  Psychiatric/Behavioral: Negative.       Objective  Vitals:   08/20/16 0817 08/20/16 0842  BP: 124/72 120/60  Pulse: (!) 59   Resp: 16   Temp: 98.3 F (36.8 C)   TempSrc: Oral   Weight: 175 lb (79.4 kg)   Height: 5\' 11"  (1.803 m)     Physical Exam  Constitutional: He is oriented to person, place, and time and well-developed, well-nourished, and in no distress. No distress.  HENT:  Head: Normocephalic and atraumatic.  Eyes: Conjunctivae and EOM are normal. Pupils are equal, round, and reactive to light. No scleral icterus.  Neck: Normal range of motion. Neck supple. Carotid bruit is not present. No thyromegaly present.  Cardiovascular: Normal rate, regular rhythm and normal heart sounds.  Exam reveals no gallop and no friction rub.   No murmur heard. Pulmonary/Chest: Effort normal and breath sounds normal. No respiratory distress. He has no wheezes. He has no rales.  Abdominal: Soft. Bowel sounds are normal. He exhibits no distension  and no mass. There is no tenderness.  Musculoskeletal: He exhibits no edema.  Lymphadenopathy:    He has no cervical adenopathy.  Neurological: He is alert and oriented to person, place, and time.  Vitals reviewed.      No results found for this or any previous visit (from the past 2160 hour(s)).   Assessment & Plan  Problem List Items Addressed This Visit      Cardiovascular and Mediastinum   HBP (high blood pressure) - Primary   Relevant Medications   lisinopril (PRINIVIL,ZESTRIL) 40 MG tablet   simvastatin (ZOCOR) 40 MG tablet   Other Relevant Orders   CBC with Differential   Comprehensive Metabolic Panel  (CMET)     Other   Hyperlipidemia   Relevant Medications   lisinopril (PRINIVIL,ZESTRIL) 40 MG tablet   simvastatin (ZOCOR) 40 MG tablet   Other Relevant Orders   Lipid Profile   Elevated blood sugar   Relevant Orders   HgB A1c      Meds ordered this encounter  Medications  . lisinopril (PRINIVIL,ZESTRIL) 40 MG tablet    Sig: Take 1 tablet (40 mg total) by mouth daily.    Dispense:  90 tablet    Refill:  3  . simvastatin (ZOCOR) 40 MG tablet    Sig: Take 1 tablet (40 mg total) by mouth daily.    Dispense:  90 tablet    Refill:  3   1. Essential hypertension  - CBC with Differential - Comprehensive Metabolic Panel (CMET) - lisinopril (PRINIVIL,ZESTRIL) 40 MG tablet; Take 1 tablet (40 mg total) by mouth daily.  Dispense: 90 tablet; Refill: 3  2. Mixed hyperlipidemia  - Lipid Profile  3. Elevated blood sugar  - HgB A1c  4. Other hyperlipidemia  - simvastatin (ZOCOR) 40 MG tablet; Take 1 tablet (40 mg total) by mouth daily.  Dispense: 90 tablet; Refill: 3

## 2016-08-21 LAB — COMPREHENSIVE METABOLIC PANEL
ALK PHOS: 45 IU/L (ref 39–117)
ALT: 16 IU/L (ref 0–44)
AST: 25 IU/L (ref 0–40)
Albumin/Globulin Ratio: 1.6 (ref 1.2–2.2)
Albumin: 4.5 g/dL (ref 3.5–4.8)
BUN/Creatinine Ratio: 16 (ref 10–24)
BUN: 14 mg/dL (ref 8–27)
Bilirubin Total: 0.6 mg/dL (ref 0.0–1.2)
CALCIUM: 9.3 mg/dL (ref 8.6–10.2)
CO2: 25 mmol/L (ref 18–29)
CREATININE: 0.88 mg/dL (ref 0.76–1.27)
Chloride: 99 mmol/L (ref 96–106)
GFR calc Af Amer: 101 mL/min/{1.73_m2} (ref >59)
GFR calc non Af Amer: 87 mL/min/{1.73_m2} (ref >59)
GLOBULIN, TOTAL: 2.9 g/dL (ref 1.5–4.5)
GLUCOSE: 106 mg/dL — AB (ref 65–99)
Potassium: 4.5 mmol/L (ref 3.5–5.2)
SODIUM: 140 mmol/L (ref 134–144)
Total Protein: 7.4 g/dL (ref 6.0–8.5)

## 2016-08-21 LAB — CBC WITH DIFFERENTIAL/PLATELET
BASOS ABS: 0 10*3/uL (ref 0.0–0.2)
Basos: 1 %
EOS (ABSOLUTE): 0.1 10*3/uL (ref 0.0–0.4)
Eos: 2 %
HEMATOCRIT: 40 % (ref 37.5–51.0)
Hemoglobin: 14 g/dL (ref 13.0–17.7)
IMMATURE GRANULOCYTES: 0 %
Immature Grans (Abs): 0 10*3/uL (ref 0.0–0.1)
LYMPHS ABS: 2.4 10*3/uL (ref 0.7–3.1)
Lymphs: 38 %
MCH: 32.8 pg (ref 26.6–33.0)
MCHC: 35 g/dL (ref 31.5–35.7)
MCV: 94 fL (ref 79–97)
MONOS ABS: 0.7 10*3/uL (ref 0.1–0.9)
Monocytes: 11 %
NEUTROS PCT: 48 %
Neutrophils Absolute: 3.2 10*3/uL (ref 1.4–7.0)
Platelets: 251 10*3/uL (ref 150–379)
RBC: 4.27 x10E6/uL (ref 4.14–5.80)
RDW: 13.3 % (ref 12.3–15.4)
WBC: 6.4 10*3/uL (ref 3.4–10.8)

## 2016-08-21 LAB — HGB A1C W/O EAG: HEMOGLOBIN A1C: 5.5 % (ref 4.8–5.6)

## 2017-01-05 ENCOUNTER — Telehealth: Payer: Self-pay | Admitting: Family Medicine

## 2017-01-05 NOTE — Telephone Encounter (Signed)
Called pt to schedule Annual Wellness Visit with NHA  - knb  °

## 2017-01-26 ENCOUNTER — Telehealth: Payer: Self-pay | Admitting: Family Medicine

## 2017-01-26 NOTE — Telephone Encounter (Signed)
Called pt to schedule Annual Wellness Visit with NHA  - knb  °

## 2017-02-09 ENCOUNTER — Telehealth: Payer: Self-pay | Admitting: Family Medicine

## 2017-02-09 NOTE — Telephone Encounter (Signed)
Called pt to schedule for Annual Wellness Visit with Nurse Health Advisor, Tiffany Hill, my c/b # is [phone]  Kathryn Brown ° °

## 2017-03-02 ENCOUNTER — Encounter: Payer: Self-pay | Admitting: Family Medicine

## 2017-03-02 ENCOUNTER — Ambulatory Visit (INDEPENDENT_AMBULATORY_CARE_PROVIDER_SITE_OTHER): Payer: Medicare Other | Admitting: Family Medicine

## 2017-03-02 ENCOUNTER — Other Ambulatory Visit: Payer: Self-pay | Admitting: Family Medicine

## 2017-03-02 VITALS — BP 114/70 | HR 65 | Temp 98.5°F | Resp 16 | Ht 71.0 in | Wt 178.2 lb

## 2017-03-02 DIAGNOSIS — I10 Essential (primary) hypertension: Secondary | ICD-10-CM | POA: Diagnosis not present

## 2017-03-02 DIAGNOSIS — Z1159 Encounter for screening for other viral diseases: Secondary | ICD-10-CM

## 2017-03-02 DIAGNOSIS — E782 Mixed hyperlipidemia: Secondary | ICD-10-CM

## 2017-03-02 DIAGNOSIS — R739 Hyperglycemia, unspecified: Secondary | ICD-10-CM

## 2017-03-02 DIAGNOSIS — R7989 Other specified abnormal findings of blood chemistry: Secondary | ICD-10-CM

## 2017-03-02 DIAGNOSIS — Z87891 Personal history of nicotine dependence: Secondary | ICD-10-CM | POA: Insufficient documentation

## 2017-03-02 DIAGNOSIS — Z1211 Encounter for screening for malignant neoplasm of colon: Secondary | ICD-10-CM | POA: Insufficient documentation

## 2017-03-02 DIAGNOSIS — Z125 Encounter for screening for malignant neoplasm of prostate: Secondary | ICD-10-CM

## 2017-03-02 LAB — POCT GLYCOSYLATED HEMOGLOBIN (HGB A1C): HEMOGLOBIN A1C: 5.5 (ref ?–5.7)

## 2017-03-02 MED ORDER — ASPIRIN EC 81 MG PO TBEC: 81.0000 mg | DELAYED_RELEASE_TABLET | Freq: Every day | ORAL | Status: DC

## 2017-03-02 MED ORDER — OMEGA 3 1000 MG PO CAPS: 1000.0000 mg | ORAL_CAPSULE | Freq: Three times a day (TID) | ORAL | Status: DC

## 2017-03-02 NOTE — Patient Instructions (Addendum)
Thank you for coming to the clinic today.  1. Keep up the great work!  2. A1c 5.5, unchanged from last time.  Colon Cancer Screening: - For all adults age 71+ routine colon cancer screening is highly recommended.     - Recent guidelines from [place] recommend starting age of 63 - Early detection of colon cancer is important, because often there are no warning signs or symptoms, also if found early usually it can be cured. Late stage is hard to treat.  - If you are not interested in Colonoscopy screening (if done and normal you could be cleared for 5 to 10 years until next due), then Cologuard is an excellent alternative for screening test for Colon Cancer. It is highly sensitive for detecting DNA of colon cancer from even the earliest stages. Also, there is NO bowel prep required. - If Cologuard is NEGATIVE, then it is good for 3 years before next due - If Cologuard is POSITIVE, then it is strongly advised to get a Colonoscopy, which allows the GI doctor to locate the source of the cancer or polyp (even very early stage) and treat it by removing it. ------------------------- If you would like to proceed with Cologuard (stool DNA test) - FIRST, call your insurance company and tell them you want to check cost of Cologuard tell them CPT Code [phone] (it may be completely covered and you could get for no cost, OR max cost without any coverage is about $600). Also, keep in mind if you do NOT open the kit, and decide not to do the test, you will NOT be charged, you should contact the company if you decide not to do the test. - If you want to proceed, you can notify us (phone message, [place], or at next visit) and we will order it for you. The test kit will be delivered to you house within about 1 week. Follow instructions to collect sample, you may call the company for any help or questions, 24/7 telephone support at [phone].   DUE for FASTING BLOOD WORK (no food or drink  after midnight before the lab appointment, only water or coffee without cream/sugar on the morning of)  SCHEDULE "Lab Only" visit in the morning at the clinic for lab draw in 6 MONTHS  - Make sure Lab Only appointment is at about 1 week before your next appointment, so that results will be available  Please schedule a Follow-up Appointment to: Return in about 6 months (around 09/02/2017) for Medicare Physical CPE.  If you have any other questions or concerns, please feel free to call the clinic or send a message through [place]. You may also schedule an earlier appointment if necessary.  Additionally, you may be receiving a survey about your experience at our clinic within a few days to 1 week by e-mail or mail. We value your feedback.  Nobie Putnam, DO [place]

## 2017-03-02 NOTE — Assessment & Plan Note (Signed)
Well-controlled HTN - Home BP readings normal  No known complications  Plan:  1. Continue current BP regimen - Lisinopril 40mg  daily 2. Encourage improved lifestyle - low sodium diet, regular exercise 3. Continue monitor BP outside office, bring readings to next visit, if persistently >140/90 or new symptoms notify office sooner 4. Follow-up 6 months - labs + Medicare physical

## 2017-03-02 NOTE — Progress Notes (Signed)
Subjective:    Patient ID: Jeff Ryan, male    DOB: Nov 27, 1945, 71 y.o.   MRN: [id number]  Jeff Ryan is a 71 y.o. male presenting on 03/02/2017 for Hypertension (6 month follow up)   HPI   History of Elevated Blood Sugar Reports no concerns, except prior history of elevated A1c to 5.5 and mild elevated glucose 102-108 in past. CBGs: Not checking. Meds: Never on meds Currently on ACEi Lifestyle: - Diet (tries to balance weight, limits sugars and carbs, eats shelled peanuts, salad for lunch, does have sweet for dessert at night with sugar baby candies)  - Exercise (3 miles walking 7 days a week, treadmill for 1 hour, still mows 2 yards with push mower) Denies hypoglycemia, polyuria, visual changes, numbness or tingling.  CHRONIC HTN: Reports checks BP at home occasionally has electronic cuff, last reading 120s/60s Current Meds - Lisinopril 40mg    Reports good compliance, took meds today. Tolerating well, w/o complaints. - reports he has known chronic history abnormal heart valve or flutter sound, was initially identified by prior PCP Dr Luan Pulling, was told chronic, had normal ECHO in past. Denies CP, dyspnea, HA, edema, dizziness / lightheadedness  HYPERLIPIDEMIA: - Reports no concerns. 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 Sleeps well 7:30pm and wakes up at 3:30am, well rested  - [place]  Health Maintenance: - UTD Pneumonia vaccines, from old system - Due for Flu Shot,  will return in Fall 2018 for this - Due for colon cancer screening, had prior negative FOBT, will return in future for likely cologuard, handout given - Due for prostate cancer screening, with next labs - Due for routine Hep C screening  Social History  Substance Use Topics  . Smoking status: Former Smoker    Types: Cigarettes    Quit date: 01/11/2007  . Smokeless tobacco: Never Used  . Alcohol use 0.0 oz/week     Comment: beer x2 weekly    Review of Systems Per HPI unless specifically indicated above     Objective:    BP 114/70 (BP Location: Left Arm, Cuff Size: Normal)   Pulse 65   Temp 98.5 F (36.9 C) (Oral)   Resp 16   Ht 5\' 11"  (1.803 m)   Wt 178 lb 3.2 oz (80.8 kg)   BMI 24.85 kg/m   Wt Readings from Last 3 Encounters:  03/02/17 178 lb 3.2 oz (80.8 kg)  08/20/16 175 lb (79.4 kg)  02/17/16 172 lb (78 kg)    Physical Exam  Constitutional: He is oriented to person, place, and time. He appears well-developed and well-nourished. No distress.  Well-appearing, comfortable, cooperative  HENT:  Head: Normocephalic and atraumatic.  Mouth/Throat: Oropharynx is clear and moist.  Eyes: Conjunctivae are normal. Right eye exhibits no discharge. Left eye exhibits no discharge.  Neck: Normal range of motion. Neck supple. No thyromegaly present.  Cardiovascular: Normal rate, regular rhythm, normal heart sounds and intact distal pulses.   No murmur heard. Faint valvular flutter heard, reportedly chronic  Pulmonary/Chest: Effort normal and breath sounds normal. No respiratory distress. He has no wheezes. He has no rales.  Musculoskeletal: Normal range of motion. He exhibits no edema.  Lymphadenopathy:    He has no cervical adenopathy.  Neurological: He is alert and oriented to person, place, and time.  Skin: Skin is warm and dry. No rash noted. He is not diaphoretic. No erythema.  Psychiatric: He has a normal mood and affect. His behavior is normal.  Well groomed, good eye  contact, normal speech and thoughts  Nursing note and vitals reviewed.    Results for orders placed or performed in visit on 03/02/17  POCT glycosylated hemoglobin (Hb A1C)  Result Value Ref Range   Hemoglobin A1C 5.5 5.7      Assessment & Plan:   Problem List Items Addressed This Visit    Hyperlipidemia    Controlled cholesterol on statin and lifestyle Last lipid panel 02/2016, normal Calculated ASCVD 10 yr risk score elevated based on age Fam history MI CAD brother  Plan: 1. Continue current meds - Simvastatin 40mg  daily 2. Continue ASA 81mg  for primary ASCVD risk reduction 3. Encourage improved lifestyle - low carb/cholesterol, reduce portion size, continue improving regular exercise 4. Follow-up 6 months labs, lipids      Relevant Medications   aspirin EC 81 MG tablet   Essential hypertension - Primary    Well-controlled HTN - Home BP readings normal  No known complications  Plan:  1. Continue current BP regimen - Lisinopril 40mg  daily 2. Encourage improved lifestyle - low sodium diet, regular exercise 3. Continue monitor BP outside office, bring readings to next visit, if persistently >140/90 or new symptoms notify office sooner 4. Follow-up 6 months - labs + Medicare physical      Relevant Medications   aspirin EC 81 MG tablet   Elevated blood sugar    Mild elevated glucose A1c 5.5, stable unchanged, never dx PreDM  Plan:  1. Not on any therapy currently 2. Encourage improved lifestyle - low carb, low sugar diet, reduce portion size, continue improving regular exercise 3. Follow-up 6 months labs + A1c      Relevant Orders   POCT glycosylated hemoglobin (Hb A1C) (Completed)      Meds ordered this encounter  Medications  . aspirin EC 81 MG tablet    Sig: Take 1 tablet (81 mg total) by mouth daily.  . Omega 3 1000 MG CAPS    Sig: Take 1 capsule (1,000 mg total) by mouth 3 (three) times daily.    Dispense:  90 each    Follow up plan: Return in about 6  months (around 09/02/2017) for Medicare Physical CPE.  Nobie Putnam, [facility] 03/02/2017, 9:04 AM

## 2017-03-02 NOTE — Assessment & Plan Note (Signed)
Controlled cholesterol on statin and lifestyle Last lipid panel 02/2016, normal Calculated ASCVD 10 yr risk score elevated based on age Fam history MI CAD brother  Plan: 1. Continue current meds - Simvastatin 40mg  daily 2. Continue ASA 81mg  for primary ASCVD risk reduction 3. Encourage improved lifestyle - low carb/cholesterol, reduce portion size, continue improving regular exercise 4. Follow-up 6 months labs, lipids

## 2017-03-02 NOTE — Assessment & Plan Note (Signed)
Mild elevated glucose A1c 5.5, stable unchanged, never dx PreDM  Plan:  1. Not on any therapy currently 2. Encourage improved lifestyle - low carb, low sugar diet, reduce portion size, continue improving regular exercise 3. Follow-up 6 months labs + A1c

## 2017-03-02 NOTE — Assessment & Plan Note (Signed)
Due for routine colon cancer screening. Never had colonoscopy (not interested), no family history colon cancer. - Discussion today about recommendations for either Colonoscopy or Cologuard screening, benefits and risks of screening, interested in Cologuard, understands that if positive then recommendation is for diagnostic colonoscopy to follow-up.  - Patient advised to contact insurance first to learn cost, will notify us when ready for us to order Cologuard 

## 2017-03-02 NOTE — Assessment & Plan Note (Signed)
Remains tobacco free >10 years

## 2017-03-29 LAB — FECAL OCCULT BLOOD, GUAIAC: Fecal Occult Blood: NEGATIVE

## 2017-04-04 ENCOUNTER — Encounter: Payer: Self-pay | Admitting: Family Medicine

## 2017-04-06 ENCOUNTER — Ambulatory Visit (INDEPENDENT_AMBULATORY_CARE_PROVIDER_SITE_OTHER): Payer: Medicare Other

## 2017-04-06 DIAGNOSIS — Z23 Encounter for immunization: Secondary | ICD-10-CM | POA: Diagnosis not present

## 2017-06-17 DIAGNOSIS — Z961 Presence of intraocular lens: Secondary | ICD-10-CM | POA: Diagnosis not present

## 2017-06-23 ENCOUNTER — Other Ambulatory Visit: Payer: Self-pay

## 2017-06-23 DIAGNOSIS — I10 Essential (primary) hypertension: Secondary | ICD-10-CM

## 2017-06-23 DIAGNOSIS — E7849 Other hyperlipidemia: Secondary | ICD-10-CM

## 2017-06-23 MED ORDER — SIMVASTATIN 40 MG PO TABS: 40.0000 mg | ORAL_TABLET | Freq: Every day | ORAL | 3 refills | Status: DC

## 2017-06-23 MED ORDER — LISINOPRIL 40 MG PO TABS: 40.0000 mg | ORAL_TABLET | Freq: Every day | ORAL | 3 refills | Status: DC

## 2017-06-24 ENCOUNTER — Telehealth: Payer: Self-pay

## 2017-06-24 NOTE — Telephone Encounter (Signed)
His Rx send yesterday to [facility].

## 2017-06-24 NOTE — Telephone Encounter (Signed)
[facility] faxed refill request 06/22/17: 90 day supply  Lisinopril 40mg   Zocor 40mg 

## 2017-09-01 ENCOUNTER — Other Ambulatory Visit: Payer: Medicare Other

## 2017-09-01 DIAGNOSIS — Z125 Encounter for screening for malignant neoplasm of prostate: Secondary | ICD-10-CM

## 2017-09-01 DIAGNOSIS — E782 Mixed hyperlipidemia: Secondary | ICD-10-CM

## 2017-09-01 DIAGNOSIS — R7989 Other specified abnormal findings of blood chemistry: Secondary | ICD-10-CM

## 2017-09-01 DIAGNOSIS — R739 Hyperglycemia, unspecified: Secondary | ICD-10-CM

## 2017-09-01 DIAGNOSIS — I10 Essential (primary) hypertension: Secondary | ICD-10-CM

## 2017-09-01 DIAGNOSIS — Z1159 Encounter for screening for other viral diseases: Secondary | ICD-10-CM | POA: Diagnosis not present

## 2017-09-01 NOTE — Addendum Note (Signed)
Addended by: Thomes Lolling on: 09/01/2017 08:00 AM   Modules accepted: Orders

## 2017-09-02 LAB — COMPLETE METABOLIC PANEL WITH GFR
AG RATIO: 1.9 (calc) (ref 1.0–2.5)
ALT: 15 U/L (ref 9–46)
AST: 19 U/L (ref 10–35)
Albumin: 4.4 g/dL (ref 3.6–5.1)
Alkaline phosphatase (APISO): 39 U/L — ABNORMAL LOW (ref 40–115)
BUN: 15 mg/dL (ref 7–25)
CALCIUM: 9.4 mg/dL (ref 8.6–10.3)
CO2: 28 mmol/L (ref 20–32)
Chloride: 106 mmol/L (ref 98–110)
Creat: 0.86 mg/dL (ref 0.70–1.18)
GFR, EST AFRICAN AMERICAN: 101 mL/min/{1.73_m2} (ref >=60)
GFR, EST NON AFRICAN AMERICAN: 87 mL/min/{1.73_m2} (ref >=60)
GLUCOSE: 101 mg/dL — AB (ref 65–99)
Globulin: 2.3 g/dL (calc) (ref 1.9–3.7)
POTASSIUM: 4.6 mmol/L (ref 3.5–5.3)
Sodium: 140 mmol/L (ref 135–146)
TOTAL PROTEIN: 6.7 g/dL (ref 6.1–8.1)
Total Bilirubin: 0.6 mg/dL (ref 0.2–1.2)

## 2017-09-02 LAB — HEPATITIS C ANTIBODY
HEP C AB: NONREACTIVE
SIGNAL TO CUT-OFF: 0.02 (ref <1.00)

## 2017-09-02 LAB — HEMOGLOBIN A1C
EAG (MMOL/L): 6.2 (calc)
Hgb A1c MFr Bld: 5.5 % of total Hgb (ref <5.7)
Mean Plasma Glucose: 111 (calc)

## 2017-09-02 LAB — CBC WITH DIFFERENTIAL/PLATELET
BASOS PCT: 0.7 %
Basophils Absolute: 39 cells/uL (ref 0–200)
EOS PCT: 0.9 %
Eosinophils Absolute: 50 cells/uL (ref 15–500)
HCT: 39.9 % (ref 38.5–50.0)
HEMOGLOBIN: 13.3 g/dL (ref 13.2–17.1)
Lymphs Abs: 2272 cells/uL (ref 850–3900)
MCH: 31.5 pg (ref 27.0–33.0)
MCHC: 33.3 g/dL (ref 32.0–36.0)
MCV: 94.5 fL (ref 80.0–100.0)
MONOS PCT: 7.9 %
MPV: 11 fL (ref 7.5–12.5)
NEUTROS ABS: 2706 {cells}/uL (ref 1500–7800)
Neutrophils Relative %: 49.2 %
Platelets: 259 10*3/uL (ref 140–400)
RBC: 4.22 10*6/uL (ref 4.20–5.80)
RDW: 12 % (ref 11.0–15.0)
Total Lymphocyte: 41.3 %
WBC mixed population: 435 cells/uL (ref 200–950)
WBC: 5.5 10*3/uL (ref 3.8–10.8)

## 2017-09-02 LAB — LIPID PANEL
CHOL/HDL RATIO: 2.5 (calc) (ref <5.0)
CHOLESTEROL: 143 mg/dL (ref <200)
HDL: 58 mg/dL (ref >40)
LDL CHOLESTEROL (CALC): 70 mg/dL
NON-HDL CHOLESTEROL (CALC): 85 mg/dL (ref <130)
Triglycerides: 71 mg/dL (ref <150)

## 2017-09-02 LAB — PSA, TOTAL WITH REFLEX TO PSA, FREE: PSA, TOTAL: 0.2 ng/mL (ref <=4.0)

## 2017-09-06 ENCOUNTER — Ambulatory Visit (INDEPENDENT_AMBULATORY_CARE_PROVIDER_SITE_OTHER): Payer: Medicare Other | Admitting: Family Medicine

## 2017-09-06 ENCOUNTER — Encounter: Payer: Self-pay | Admitting: Family Medicine

## 2017-09-06 VITALS — BP 122/68 | HR 58 | Temp 98.2°F | Resp 16 | Ht 71.0 in | Wt 188.0 lb

## 2017-09-06 DIAGNOSIS — R739 Hyperglycemia, unspecified: Secondary | ICD-10-CM | POA: Diagnosis not present

## 2017-09-06 DIAGNOSIS — E782 Mixed hyperlipidemia: Secondary | ICD-10-CM | POA: Diagnosis not present

## 2017-09-06 DIAGNOSIS — Z1211 Encounter for screening for malignant neoplasm of colon: Secondary | ICD-10-CM

## 2017-09-06 DIAGNOSIS — I10 Essential (primary) hypertension: Secondary | ICD-10-CM | POA: Diagnosis not present

## 2017-09-06 DIAGNOSIS — Z0000 Encounter for general adult medical examination without abnormal findings: Secondary | ICD-10-CM | POA: Diagnosis not present

## 2017-09-06 NOTE — Assessment & Plan Note (Signed)
Well-controlled HTN - Home BP readings normal  No known complications  Plan:  1. Continue current BP regimen - Lisinopril 40mg  daily 2. Encourage improved lifestyle - low sodium diet, regular exercise 3. Continue monitor BP outside office, bring readings to next visit, if persistently >140/90 or new symptoms notify office sooner 4. Follow-up 6 months

## 2017-09-06 NOTE — Assessment & Plan Note (Signed)
Controlled cholesterol on statin and lifestyle Last lipid panel 08/2017, normal Calculated ASCVD 10 yr risk score elevated based on age Fam history MI CAD brother  Plan: 1. Continue current meds - Simvastatin 40mg  daily, Omega 3 2. Continue ASA 81mg  for primary ASCVD risk reduction 3. Encourage improved lifestyle - low carb/cholesterol, reduce portion size, continue improving regular exercise 4. Follow-up 6 months then q 12 months for lipids

## 2017-09-06 NOTE — Progress Notes (Signed)
Subjective:    Patient ID: Jeff Ryan, male    DOB: Apr 10, 1946, 72 y.o.   MRN: [id number]  Jeff Ryan is a 72 y.o. male presenting on 09/06/2017 for Annual Exam and Hypertension (pt's B/P ranges at home 122/76)   HPI   Here for Annual Physical and Lab Review  History of Elevated Blood Sugar History of controlled A1c in past, recent results 5.5 unchanged in 2 years. Last result A1c 5.5 see below History years ago with obesity wt >240 lbs, then he lost a bunch of weight years ago, and has improved lifestyle see below CBGs: Not checking. Meds: Never on meds Currently on ACEi Lifestyle: - Diet (Continues healthy diet - limits sugars and carbs, salad for lunch, does have sweet for dessert at night with sugar baby candies)  - Exercise (Unchanged, he continues good exercise regimen with 3 miles walking 7 days a week, treadmill for 1 hour, and yardwork) Denies hypoglycemia, polyuria, visual changes, numbness or tingling.  CHRONIC HTN: Reports checks BP at home occasionally has electronic cuff, recent readings 120s/60-70s, without concern states sometimes electronic cuff here in office is elevated Current Meds - Lisinopril 40mg    Reports good compliance, took meds today. Tolerating well, w/o complaints.  HYPERLIPIDEMIA: - Reports no concerns. Last lipid panel 08/2017, well controlled - Currently taking Simvastatin 40mg , tolerating well without side effects or myalgias - Taking Omega 3 fish oil 1000mg  x 3 daily, for many years, also thinks this helps his joints  Former Smoker Quit smoking 10 yr ago, had hemoptysis due to burst blood vessel, had bronch normal without significant scarring or mass , wife still smokes outside   Health Maintenance:  Colon CA Screening: Never had colonoscopy. Prior FOBT negative yearly in past, recently had FIT testing through Medicare negative, 03/2017, scanned result today. He has not decided on cologuard but remains interested. Currently  asymptomatic. No known family history of colon CA. His wife has had polyps and gets colonoscopy he only would do this if it was necessary. He will contact ins for Cologuard coverage first and may proceed with this when ready  Prostate CA Screening: Prior PSA / DRE reported normal. Last PSA 0.2 (08/2017). Currently asymptomatic without BPH LUTS. No known family history of prostate CA.   - UTD Pneumonia vaccines - UTD Flu Shot this season - UTD Routine Hep C negative  Depression screen [facility] 2/9 09/06/2017 03/02/2017 02/17/2016  Decreased Interest 0 0 0  Down, Depressed, Hopeless 0 0 0  PHQ - 2 Score 0 0 0    Past Medical History:  Diagnosis Date  . COPD (chronic obstructive pulmonary disease) ([place])   . Dyslipidemia   . Essential hypertension    Past Surgical History:  Procedure Laterality Date  . CATARACT EXTRACTION, BILATERAL     left 10/26/2014 right 10/12/2014  . TONSILLECTOMY AND ADENOIDECTOMY    . US ECHOCARDIOGRAPHY     10/2014  . VASECTOMY     Social History   Socioeconomic History  . Marital status: Married    Spouse name: Not on file  . Number of children: Not on file  . Years of education: Not on file  . Highest education level: Not on file  Social Needs  . Financial resource strain: Not on file  . Food insecurity - worry: Not on file  . Food insecurity - inability: Not on file  . Transportation needs - medical: Not on file  . Transportation needs - non-medical: Not on file  Occupational History  .  Occupation: Retired Multimedia programmer)    [employer] in [place], traveled around [place], retired age 16  Tobacco Use  . Smoking status: Former Smoker    Types: Cigarettes    Last attempt to quit: 01/11/2007    Years since quitting: 10.6  . Smokeless tobacco: Never Used  Substance and Sexual Activity  . Alcohol use: Yes    Alcohol/week: 0.0 oz    Comment: beer x2 weekly  . Drug use: No  . Sexual activity: Not on file  Other Topics Concern  . Not on file  Social  History Narrative  . Not on file   Family History  Problem Relation Age of Onset  . Heart disease Brother   . Heart attack Brother        x 3  . Prostate cancer Neg Hx   . Colon cancer Neg Hx    Current Outpatient Medications on File Prior to Visit  Medication Sig  . aspirin EC 81 MG tablet Take 1 tablet (81 mg total) by mouth daily.  Marland Kitchen lisinopril (PRINIVIL,ZESTRIL) 40 MG tablet Take 1 tablet (40 mg total) by mouth daily.  . Omega 3 1000 MG CAPS Take 1 capsule (1,000 mg total) by mouth 3 (three) times daily.  . simvastatin (ZOCOR) 40 MG tablet Take 1 tablet (40 mg total) by mouth daily.   No current facility-administered medications on file prior to visit.     Review of Systems  Constitutional: Negative for activity change, appetite change, chills, diaphoresis, fatigue, fever and unexpected weight change.  HENT: Negative for congestion and hearing loss.   Eyes: Negative for visual disturbance.  Respiratory: Negative for apnea, cough, choking, chest tightness, shortness of breath and wheezing.   Cardiovascular: Negative for chest pain, palpitations and leg swelling.  Gastrointestinal: Negative for abdominal pain, anal bleeding, blood in stool, constipation, diarrhea, nausea and vomiting.  Endocrine: Negative for cold intolerance and polyuria.  Genitourinary: Negative for decreased urine volume, dysuria, frequency, hematuria, scrotal swelling, testicular pain and urgency.  Musculoskeletal: Negative for arthralgias, back pain and neck pain.  Skin: Negative for rash.  Allergic/Immunologic: Negative for environmental allergies.  Neurological: Negative for dizziness, weakness, light-headedness, numbness and headaches.  Hematological: Negative for adenopathy.  Psychiatric/Behavioral: Negative for behavioral problems, dysphoric mood and sleep disturbance. The patient is not nervous/anxious.    Per HPI unless specifically indicated above     Objective:    BP 122/68 (BP Location: Left  Arm, Cuff Size: Normal)   Pulse (!) 58   Temp 98.2 F (36.8 C) (Oral)   Resp 16   Ht 5\' 11"  (1.803 m)   Wt 188 lb (85.3 kg)   SpO2 100%   BMI 26.22 kg/m   Wt Readings from Last 3 Encounters:  09/06/17 188 lb (85.3 kg)  03/02/17 178 lb 3.2 oz (80.8 kg)  08/20/16 175 lb (79.4 kg)    Physical Exam  Constitutional: He is oriented to person, place, and time. He appears well-developed and well-nourished. No distress.  Well-appearing, comfortable, cooperative  HENT:  Head: Normocephalic and atraumatic.  Mouth/Throat: Oropharynx is clear and moist.  Frontal / maxillary sinuses non-tender. Nares patent without purulence or edema. Bilateral TMs clear partially obscured by mild dry cerumen without erythema, effusion or bulging. Oropharynx clear without erythema, exudates, edema or asymmetry.  Eyes: Conjunctivae and EOM are normal. Pupils are equal, round, and reactive to light. Right eye exhibits no discharge. Left eye exhibits no discharge.  Neck: Normal range of motion. Neck supple. No thyromegaly  present.  Cardiovascular: Normal rate, regular rhythm, normal heart sounds and intact distal pulses.  No murmur heard. Faint valvular flutter heard, chronic - changed  Pulmonary/Chest: Effort normal and breath sounds normal. No respiratory distress. He has no wheezes. He has no rales.  Abdominal: Soft. Bowel sounds are normal. He exhibits no distension and no mass. There is no tenderness.  Musculoskeletal: Normal range of motion. He exhibits no edema or tenderness.  Upper / Lower Extremities: - Normal muscle tone, strength bilateral upper extremities 5/5, lower extremities 5/5  Lymphadenopathy:    He has no cervical adenopathy.  Neurological: He is alert and oriented to person, place, and time.  Distal sensation intact to light touch all extremities  Skin: Skin is warm and dry. No rash noted. He is not diaphoretic. No erythema.  Psychiatric: He has a normal mood and affect. His behavior is  normal.  Well groomed, good eye contact, normal speech and thoughts  Nursing note and vitals reviewed.  Results for orders placed or performed in visit on 09/06/17  Fecal Occult Blood, Guaiac  Result Value Ref Range   Fecal Occult Blood Negative       Assessment & Plan:   Problem List Items Addressed This Visit    Elevated blood sugar    Stable mild elevated glucose A1c 5.5, stable unchanged, never dx PreDM  Plan:  1. Not on any therapy currently 2. Encourage improved lifestyle - low carb, low sugar diet, reduce portion size, continue improving regular exercise 3. Follow-up 6 months A1c      Essential hypertension    Well-controlled HTN - Home BP readings normal  No known complications  Plan:  1. Continue current BP regimen - Lisinopril 40mg  daily 2. Encourage improved lifestyle - low sodium diet, regular exercise 3. Continue monitor BP outside office, bring readings to next visit, if persistently >140/90 or new symptoms notify office sooner 4. Follow-up 6 months      Hyperlipidemia    Controlled cholesterol on statin and lifestyle Last lipid panel 08/2017, normal Calculated ASCVD 10 yr risk score elevated based on age Fam history MI CAD brother  Plan: 1. Continue current meds - Simvastatin 40mg  daily, Omega 3 2. Continue ASA 81mg  for primary ASCVD risk reduction 3. Encourage improved lifestyle - low carb/cholesterol, reduce portion size, continue improving regular exercise 4. Follow-up 6 months then q 12 months for lipids      Screening for colon cancer    FIT test negative for occult blood, scanned result 03/2017, per Medicare  Never had colonoscopy (not interested), no family history colon cancer.  Again, repeat discussion today about recommendations for either Colonoscopy or Cologuard screening, benefits and risks of screening, interested in Cologuard, understands that if positive then recommendation is for diagnostic colonoscopy to follow-up.  - Patient advised  to contact insurance first to learn cost, will notify us when ready for [place] to order Cologuard       Other Visit Diagnoses    Annual physical exam    -  Primary UTD Health maintenance Emphasis on colon cancer screening for further testing other than FIT testing today Encourage to keep up good work with lifestyle, diet, exercise, maintain healthy wt Control BP, sugar, lipids Follow-up q 6 mo       No orders of the defined types were placed in this encounter.     Follow up plan: Return in about 6 months (around 03/06/2018) for Elevated A1c, HTN, HLD.  Nobie Putnam, DO [facility] 09/06/2017, 9:55 AM

## 2017-09-06 NOTE — Assessment & Plan Note (Signed)
FIT test negative for occult blood, scanned result 03/2017, per Medicare  Never had colonoscopy (not interested), no family history colon cancer.  Again, repeat discussion today about recommendations for either Colonoscopy or Cologuard screening, benefits and risks of screening, interested in Cologuard, understands that if positive then recommendation is for diagnostic colonoscopy to follow-up.  - Patient advised to contact insurance first to learn cost, will notify us when ready for [place] to order Cologuard

## 2017-09-06 NOTE — Patient Instructions (Addendum)
Thank you for coming to the office today.  1. A1c 5.5, unchanged, keep up the good work  2. BP is improved on re-check, keep checking it occasionally  Keep up the great lifestyle with improvement  FIT test you had for colon cancer screening was NEGATIVE for blood in stool. This does not test for DNA only.  Colon Cancer Screening: - For all adults age 72+ routine colon cancer screening is highly recommended.     - Recent guidelines from [place] recommend starting age of 102 - Early detection of colon cancer is important, because often there are no warning signs or symptoms, also if found early usually it can be cured. Late stage is hard to treat.  - If you are not interested in Colonoscopy screening (if done and normal you could be cleared for 5 to 10 years until next due), then Cologuard is an excellent alternative for screening test for Colon Cancer. It is highly sensitive for detecting DNA of colon cancer from even the earliest stages. Also, there is NO bowel prep required. - If Cologuard is NEGATIVE, then it is good for 3 years before next due - If Cologuard is POSITIVE, then it is strongly advised to get a Colonoscopy, which allows the GI doctor to locate the source of the cancer or polyp (even very early stage) and treat it by removing it. ------------------------- If you would like to proceed with Cologuard (stool DNA test) - FIRST, call your insurance company and tell them you want to check cost of Cologuard tell them CPT Code [phone] (it may be completely covered and you could get for no cost, OR max cost without any coverage is about $600). Also, keep in mind if you do NOT open the kit, and decide not to do the test, you will NOT be charged, you should contact the company if you decide not to do the test. - If you want to proceed, you can notify us (phone message, [place], or at next visit) and we will order it for you. The test kit will be delivered to you house  within about 1 week. Follow instructions to collect sample, you may call the company for any help or questions, 24/7 telephone support at [phone].   Please schedule a Follow-up Appointment to: Return in about 6 months (around 03/06/2018) for PreDM A1c, HTN, HLD.    If you have any other questions or concerns, please feel free to call the office or send a message through [place]. You may also schedule an earlier appointment if necessary.  Additionally, you may be receiving a survey about your experience at our office within a few days to 1 week by e-mail or mail. We value your feedback.  Nobie Putnam, DO [place]

## 2017-09-06 NOTE — Assessment & Plan Note (Signed)
Stable mild elevated glucose A1c 5.5, stable unchanged, never dx PreDM  Plan:  1. Not on any therapy currently 2. Encourage improved lifestyle - low carb, low sugar diet, reduce portion size, continue improving regular exercise 3. Follow-up 6 months A1c

## 2018-01-12 ENCOUNTER — Encounter: Payer: Self-pay | Admitting: Family Medicine

## 2018-01-20 LAB — FECAL OCCULT BLOOD, GUAIAC: FECAL OCCULT BLD: NEGATIVE

## 2018-01-31 ENCOUNTER — Encounter: Payer: Self-pay | Admitting: Family Medicine

## 2018-02-03 ENCOUNTER — Encounter: Payer: Self-pay | Admitting: Family Medicine

## 2018-03-07 ENCOUNTER — Ambulatory Visit: Payer: Medicare Other | Admitting: Family Medicine

## 2018-04-04 ENCOUNTER — Other Ambulatory Visit: Payer: Self-pay | Admitting: Family Medicine

## 2018-04-04 ENCOUNTER — Encounter: Payer: Self-pay | Admitting: Family Medicine

## 2018-04-04 ENCOUNTER — Ambulatory Visit (INDEPENDENT_AMBULATORY_CARE_PROVIDER_SITE_OTHER): Payer: Medicare Other | Admitting: Family Medicine

## 2018-04-04 VITALS — BP 132/74 | HR 52 | Temp 98.3°F | Resp 16 | Ht 71.0 in | Wt 196.0 lb

## 2018-04-04 DIAGNOSIS — E663 Overweight: Secondary | ICD-10-CM | POA: Insufficient documentation

## 2018-04-04 DIAGNOSIS — I10 Essential (primary) hypertension: Secondary | ICD-10-CM | POA: Diagnosis not present

## 2018-04-04 DIAGNOSIS — R739 Hyperglycemia, unspecified: Secondary | ICD-10-CM | POA: Diagnosis not present

## 2018-04-04 DIAGNOSIS — Z0000 Encounter for general adult medical examination without abnormal findings: Secondary | ICD-10-CM

## 2018-04-04 DIAGNOSIS — E669 Obesity, unspecified: Secondary | ICD-10-CM | POA: Insufficient documentation

## 2018-04-04 DIAGNOSIS — E782 Mixed hyperlipidemia: Secondary | ICD-10-CM | POA: Diagnosis not present

## 2018-04-04 DIAGNOSIS — Z125 Encounter for screening for malignant neoplasm of prostate: Secondary | ICD-10-CM

## 2018-04-04 LAB — POCT GLYCOSYLATED HEMOGLOBIN (HGB A1C): HEMOGLOBIN A1C: 5.7 % — AB (ref 4.0–5.6)

## 2018-04-04 NOTE — Assessment & Plan Note (Signed)
Well-controlled HTN - Home BP readings normal  No known complications  Plan:  1. Continue current BP regimen - Lisinopril 40mg  daily 2. Encourage improved lifestyle - low sodium diet, regular exercise 3. Continue monitor BP outside office, bring readings to next visit, if persistently >140/90 or new symptoms notify office sooner 4. Follow-up 6 months yearly

## 2018-04-04 NOTE — Patient Instructions (Addendum)
Thank you for coming to the office today.  Please schedule and return for a NURSE ONLY VISIT for VACCINE - Approximately in 1-2 weeks - Need High Dose Flu Vaccine  Continue current medications. - No changes. Refills good through December. Notify if need something.  A1c 5.7, stable from before, slightly elevated, agree with plan to improve diet as discussed and keep walking.  DUE for FASTING BLOOD WORK (no food or drink after midnight before the lab appointment, only water or coffee without cream/sugar on the morning of)  SCHEDULE "Lab Only" visit in the morning at the clinic for lab draw in 6 MONTHS   - Make sure Lab Only appointment is at about 1 week before your next appointment, so that results will be available  For Lab Results, once available within 2-3 days of blood draw, you can can log in to MyChart online to view your results and a brief explanation. Also, we can discuss results at next follow-up visit.   Please schedule a Follow-up Appointment to: Return in about 6 months (around 10/03/2018) for Annual Physical.  If you have any other questions or concerns, please feel free to call the office or send a message through [place]. You may also schedule an earlier appointment if necessary.  Additionally, you may be receiving a survey about your experience at our office within a few days to 1 week by e-mail or mail. We value your feedback.  Nobie Putnam, DO [place]

## 2018-04-04 NOTE — Assessment & Plan Note (Signed)
Encouraged continue healthy lifestyle w/ goals to maintain wt 

## 2018-04-04 NOTE — Assessment & Plan Note (Signed)
Controlled cholesterol on statin and lifestyle Last lipid panel 08/2017, normal Calculated ASCVD 10 yr risk score elevated based on age Fam history MI CAD brother  Plan: 1. Continue current meds - Simvastatin 40mg  daily, Omega 3 2. Continue ASA 81mg  for primary ASCVD risk reduction 3. Encourage improved lifestyle - low carb/cholesterol, reduce portion size, continue improving regular exercise 4. Follow-up 6 months for yearly and lipids

## 2018-04-04 NOTE — Assessment & Plan Note (Signed)
Stable mild elevated glucose A1c 5.7 No diabetes PreDM  Plan:  1. Not on any therapy currently 2. Encourage improved lifestyle - low carb, low sugar diet, reduce portion size, continue improving regular exercise 3. Follow-up 6 months yearly for A1c and labs

## 2018-04-04 NOTE — Progress Notes (Signed)
Subjective:    Patient ID: Jeff Ryan, male    DOB: 1946/03/14, 72 y.o.   MRN: [id number]  Jeff Ryan is a 72 y.o. male presenting on 04/04/2018 for Hypertension   HPI   History of Elevated Blood Sugar / BMI >27, Overweight Today A1c 5.7. Overall has had controlled A1c in past. He remains healthy weight overall. He has lost a lot of weight over the years. Home scale measures lower than office scale here. CBGs:Not checking. Meds:Never on meds Currently on ACEi Lifestyle: - Diet (Continues healthy diet - limits sugars and carbs, salads)  - Exercise (He continues good exercise regimen with 3 miles walking 7 days a week, treadmill for 1 hour, and yardwork here he has a garden and works on this regularly) Denies hypoglycemia, polyuria, visual changes, numbness or tingling.  CHRONIC HTN: Reportschecks BP at home occasionally has electronic cuff about 1-2x month, unchanged recent readings 120s/60-70s Current Meds -Lisinopril 40mg  Reports good compliance, took meds today. Tolerating well, w/o complaints. Denies CP, dyspnea, HA, edema, dizziness / lightheadedness  HYPERLIPIDEMIA: - Reports no concerns. Last lipid panel2/2019,wellcontrolled - Currently takingSimvastatin 40mg , tolerating well without side effects or myalgias - Taking Omega 3 fish oil 1000mg  x 3 daily, for many years, also thinks this helps his joints  Health Maintenance: Due for Flu Shot, due for high dose vaccine, will return 1-2 week when in stock   Depression screen [facility] 2/9 04/04/2018 09/06/2017 03/02/2017  Decreased Interest 0 0 0  Down, Depressed, Hopeless 0 0 0  PHQ - 2 Score 0 0 0    Social History   Tobacco Use  . Smoking status: Former Smoker    Types: Cigarettes    Last attempt to quit: 01/11/2007    Years since quitting: 11.2  . Smokeless tobacco: Never Used  Substance Use Topics  . Alcohol use: Yes    Alcohol/week: 0.0 standard drinks    Comment: beer x2 weekly  . Drug use: No     Review of Systems Per HPI unless specifically indicated above     Objective:    BP 132/74   Pulse (!) 52   Temp 98.3 F (36.8 C) (Oral)   Resp 16   Ht 5\' 11"  (1.803 m)   Wt 196 lb (88.9 kg)   BMI 27.34 kg/m   Wt Readings from Last 3 Encounters:  04/04/18 196 lb (88.9 kg)  09/06/17 188 lb (85.3 kg)  03/02/17 178 lb 3.2 oz (80.8 kg)    Physical Exam  Constitutional: He is oriented to person, place, and time. He appears well-developed and well-nourished. No distress.  Well-appearing, comfortable, cooperative  HENT:  Head: Normocephalic and atraumatic.  Mouth/Throat: Oropharynx is clear and moist.  Eyes: Conjunctivae are normal. Right eye exhibits no discharge. Left eye exhibits no discharge.  Neck: Normal range of motion. Neck supple. No thyromegaly present.  Cardiovascular: Normal rate, regular rhythm, normal heart sounds and intact distal pulses.  No murmur heard. Pulmonary/Chest: Effort normal and breath sounds normal. No respiratory distress. He has no wheezes. He has no rales.  Musculoskeletal: Normal range of motion. He exhibits no edema.  Lymphadenopathy:    He has no cervical adenopathy.  Neurological: He is alert and oriented to person, place, and time.  Skin: Skin is warm and dry. No rash noted. He is not diaphoretic. No erythema.  Several mild ecchymosis bilateral L>R forearms  Psychiatric: He has a normal mood and affect. His behavior is normal.  Well groomed, good eye contact,  normal speech and thoughts  Nursing note and vitals reviewed.    Recent Labs    09/01/17 0802 04/04/18 0831  HGBA1C 5.5 5.7*    Results for orders placed or performed in visit on 04/04/18  POCT HgB A1C  Result Value Ref Range   Hemoglobin A1C 5.7 (A) 4.0 - 5.6 %      Assessment & Plan:   Problem List Items Addressed This Visit    Elevated blood sugar - Primary    Stable mild elevated glucose A1c 5.7 No diabetes PreDM  Plan:  1. Not on any therapy currently 2.  Encourage improved lifestyle - low carb, low sugar diet, reduce portion size, continue improving regular exercise 3. Follow-up 6 months yearly for A1c and labs      Relevant Orders   POCT HgB A1C (Completed)   Essential hypertension    Well-controlled HTN - Home BP readings normal  No known complications  Plan:  1. Continue current BP regimen - Lisinopril 40mg  daily 2. Encourage improved lifestyle - low sodium diet, regular exercise 3. Continue monitor BP outside office, bring readings to next visit, if persistently >140/90 or new symptoms notify office sooner 4. Follow-up 6 months yearly      Hyperlipidemia    Controlled cholesterol on statin and lifestyle Last lipid panel 08/2017, normal Calculated ASCVD 10 yr risk score elevated based on age Fam history MI CAD brother  Plan: 1. Continue current meds - Simvastatin 40mg  daily, Omega 3 2. Continue ASA 81mg  for primary ASCVD risk reduction 3. Encourage improved lifestyle - low carb/cholesterol, reduce portion size, continue improving regular exercise 4. Follow-up 6 months for yearly and lipids      Overweight (BMI 25.0-29.9)    Encouraged continue healthy lifestyle w/ goals to maintain wt         No orders of the defined types were placed in this encounter.   Follow up plan: Return in about 6 months (around 10/03/2018) for Annual Physical.  Future labs ordered for 09/26/18  Nobie Putnam, [facility] 04/04/2018, 9:11 AM

## 2018-04-05 ENCOUNTER — Other Ambulatory Visit: Payer: Self-pay

## 2018-04-05 NOTE — Patient Outreach (Signed)
[facility]) Care Management  04/05/2018  Jeff Ryan [birth year] [id number]   Medication Adherence call to Mr. Jeff Ryan left a message for patient to call back patient is due on Lisinopril 40 mg and Simvastatin 40 mg. Jeff Ryan is showing past due under [place].  Jeff Ryan Management Direct Dial [phone]  Fax [phone] [email or web address]

## 2018-04-08 ENCOUNTER — Ambulatory Visit (INDEPENDENT_AMBULATORY_CARE_PROVIDER_SITE_OTHER): Payer: Medicare Other

## 2018-04-08 VITALS — Temp 97.5°F

## 2018-04-08 DIAGNOSIS — Z23 Encounter for immunization: Secondary | ICD-10-CM | POA: Diagnosis not present

## 2018-06-28 ENCOUNTER — Other Ambulatory Visit: Payer: Self-pay | Admitting: Family Medicine

## 2018-06-28 DIAGNOSIS — I10 Essential (primary) hypertension: Secondary | ICD-10-CM

## 2018-06-28 DIAGNOSIS — E7849 Other hyperlipidemia: Secondary | ICD-10-CM

## 2018-06-30 DIAGNOSIS — Z961 Presence of intraocular lens: Secondary | ICD-10-CM | POA: Diagnosis not present

## 2018-09-28 ENCOUNTER — Other Ambulatory Visit: Payer: Medicare Other

## 2018-10-03 ENCOUNTER — Encounter: Payer: Medicare Other | Admitting: Family Medicine

## 2019-04-26 ENCOUNTER — Telehealth: Payer: Self-pay

## 2019-04-26 DIAGNOSIS — R351 Nocturia: Secondary | ICD-10-CM

## 2019-04-26 DIAGNOSIS — R7989 Other specified abnormal findings of blood chemistry: Secondary | ICD-10-CM

## 2019-04-26 DIAGNOSIS — Z125 Encounter for screening for malignant neoplasm of prostate: Secondary | ICD-10-CM

## 2019-04-26 DIAGNOSIS — R739 Hyperglycemia, unspecified: Secondary | ICD-10-CM

## 2019-04-26 DIAGNOSIS — Z0000 Encounter for general adult medical examination without abnormal findings: Secondary | ICD-10-CM

## 2019-04-26 DIAGNOSIS — E782 Mixed hyperlipidemia: Secondary | ICD-10-CM

## 2019-04-26 DIAGNOSIS — I10 Essential (primary) hypertension: Secondary | ICD-10-CM

## 2019-04-26 NOTE — Telephone Encounter (Signed)
Ordered physical labs for 05/01/19  Jeff Ryan, Jeff Ryan 04/26/2019, 6:30 PM

## 2019-04-30 ENCOUNTER — Other Ambulatory Visit: Payer: Self-pay | Admitting: Family Medicine

## 2019-04-30 DIAGNOSIS — I10 Essential (primary) hypertension: Secondary | ICD-10-CM

## 2019-04-30 DIAGNOSIS — E7849 Other hyperlipidemia: Secondary | ICD-10-CM

## 2019-05-01 ENCOUNTER — Other Ambulatory Visit: Payer: Medicare Other

## 2019-05-01 ENCOUNTER — Other Ambulatory Visit: Payer: Self-pay

## 2019-05-01 DIAGNOSIS — R739 Hyperglycemia, unspecified: Secondary | ICD-10-CM

## 2019-05-01 DIAGNOSIS — R351 Nocturia: Secondary | ICD-10-CM

## 2019-05-01 DIAGNOSIS — R7989 Other specified abnormal findings of blood chemistry: Secondary | ICD-10-CM | POA: Diagnosis not present

## 2019-05-01 DIAGNOSIS — Z0000 Encounter for general adult medical examination without abnormal findings: Secondary | ICD-10-CM

## 2019-05-01 DIAGNOSIS — E782 Mixed hyperlipidemia: Secondary | ICD-10-CM

## 2019-05-01 DIAGNOSIS — I10 Essential (primary) hypertension: Secondary | ICD-10-CM

## 2019-05-01 DIAGNOSIS — Z125 Encounter for screening for malignant neoplasm of prostate: Secondary | ICD-10-CM

## 2019-05-02 LAB — LIPID PANEL
Cholesterol: 156 mg/dL (ref <200)
HDL: 62 mg/dL (ref >=40)
LDL Cholesterol (Calc): 77 mg/dL (calc)
Non-HDL Cholesterol (Calc): 94 mg/dL (calc) (ref <130)
Total CHOL/HDL Ratio: 2.5 (calc) (ref <5.0)
Triglycerides: 85 mg/dL (ref <150)

## 2019-05-02 LAB — HEMOGLOBIN A1C
Hgb A1c MFr Bld: 5.4 % of total Hgb (ref <5.7)
Mean Plasma Glucose: 108 (calc)
eAG (mmol/L): 6 (calc)

## 2019-05-02 LAB — COMPLETE METABOLIC PANEL WITH GFR
AG Ratio: 1.7 (calc) (ref 1.0–2.5)
ALT: 17 U/L (ref 9–46)
AST: 23 U/L (ref 10–35)
Albumin: 4.1 g/dL (ref 3.6–5.1)
Alkaline phosphatase (APISO): 36 U/L (ref 35–144)
BUN: 22 mg/dL (ref 7–25)
CO2: 29 mmol/L (ref 20–32)
Calcium: 9.1 mg/dL (ref 8.6–10.3)
Chloride: 105 mmol/L (ref 98–110)
Creat: 0.8 mg/dL (ref 0.70–1.18)
GFR, Est African American: 103 mL/min/{1.73_m2} (ref >=60)
GFR, Est Non African American: 89 mL/min/{1.73_m2} (ref >=60)
Globulin: 2.4 g/dL (calc) (ref 1.9–3.7)
Glucose, Bld: 108 mg/dL — ABNORMAL HIGH (ref 65–99)
Potassium: 4.8 mmol/L (ref 3.5–5.3)
Sodium: 140 mmol/L (ref 135–146)
Total Bilirubin: 0.6 mg/dL (ref 0.2–1.2)
Total Protein: 6.5 g/dL (ref 6.1–8.1)

## 2019-05-02 LAB — CBC WITH DIFFERENTIAL/PLATELET
Absolute Monocytes: 507 cells/uL (ref 200–950)
Basophils Absolute: 52 cells/uL (ref 0–200)
Basophils Relative: 0.8 %
Eosinophils Absolute: 78 cells/uL (ref 15–500)
Eosinophils Relative: 1.2 %
HCT: 41.2 % (ref 38.5–50.0)
Hemoglobin: 13.8 g/dL (ref 13.2–17.1)
Lymphs Abs: 2145 cells/uL (ref 850–3900)
MCH: 32.6 pg (ref 27.0–33.0)
MCHC: 33.5 g/dL (ref 32.0–36.0)
MCV: 97.4 fL (ref 80.0–100.0)
MPV: 11.5 fL (ref 7.5–12.5)
Monocytes Relative: 7.8 %
Neutro Abs: 3718 cells/uL (ref 1500–7800)
Neutrophils Relative %: 57.2 %
Platelets: 233 10*3/uL (ref 140–400)
RBC: 4.23 10*6/uL (ref 4.20–5.80)
RDW: 12.5 % (ref 11.0–15.0)
Total Lymphocyte: 33 %
WBC: 6.5 10*3/uL (ref 3.8–10.8)

## 2019-05-02 LAB — PSA: PSA: 0.3 ng/mL (ref <=4.0)

## 2019-05-07 ENCOUNTER — Encounter: Payer: Self-pay | Admitting: Family Medicine

## 2019-05-08 ENCOUNTER — Other Ambulatory Visit: Payer: Self-pay

## 2019-05-08 ENCOUNTER — Ambulatory Visit (INDEPENDENT_AMBULATORY_CARE_PROVIDER_SITE_OTHER): Payer: Medicare Other | Admitting: Family Medicine

## 2019-05-08 ENCOUNTER — Encounter: Payer: Self-pay | Admitting: Family Medicine

## 2019-05-08 VITALS — BP 138/62 | HR 75 | Ht 71.0 in | Wt 206.6 lb

## 2019-05-08 DIAGNOSIS — Z23 Encounter for immunization: Secondary | ICD-10-CM | POA: Diagnosis not present

## 2019-05-08 DIAGNOSIS — Z87891 Personal history of nicotine dependence: Secondary | ICD-10-CM

## 2019-05-08 DIAGNOSIS — Z1211 Encounter for screening for malignant neoplasm of colon: Secondary | ICD-10-CM

## 2019-05-08 DIAGNOSIS — E782 Mixed hyperlipidemia: Secondary | ICD-10-CM

## 2019-05-08 DIAGNOSIS — Z0000 Encounter for general adult medical examination without abnormal findings: Secondary | ICD-10-CM | POA: Diagnosis not present

## 2019-05-08 DIAGNOSIS — Z87442 Personal history of urinary calculi: Secondary | ICD-10-CM

## 2019-05-08 DIAGNOSIS — E663 Overweight: Secondary | ICD-10-CM | POA: Diagnosis not present

## 2019-05-08 DIAGNOSIS — I10 Essential (primary) hypertension: Secondary | ICD-10-CM | POA: Diagnosis not present

## 2019-05-08 DIAGNOSIS — R739 Hyperglycemia, unspecified: Secondary | ICD-10-CM

## 2019-05-08 NOTE — Progress Notes (Signed)
Subjective:    Patient ID: Jeff Ryan, male    DOB: June 15, 1946, 73 y.o.   MRN: [id number]  Jeff Ryan is a 73 y.o. male presenting on 05/08/2019 for Annual Exam   HPI   Here for Annual Physical and Lab Review.  History of Elevated Blood Sugar / BMI >28 Overweight Improved hemoglobin A1c CBGs:Not checking. Meds:Never on meds Currently on ACEi Lifestyle: - Diet (Continues healthy diet -limits sugars and carbs, salads)  - Exercise (He continues good exercise regimen with3 miles walking 7 days a week, treadmill for 1 hour, and yardwork here he has a garden and works on this regularly) Denies hypoglycemia, polyuria, visual changes, numbness or tingling.  CHRONIC HTN: Reportschecks BP at home occasionally has electronic cuff about 1-2x month,unchanged recent readings 120s/60-70s Current Meds -Lisinopril 40mg  Reports good compliance, took meds today. Tolerating well, w/o complaints. Denies CP, dyspnea, HA, edema, dizziness / lightheadedness  HYPERLIPIDEMIA: - Reports no concerns. Last lipid 04/2019,wellcontrolled - Currently takingSimvastatin 40mg , tolerating well without side effects or myalgias - Taking Omega 3 fish oil 1000mg  x 3 daily, for many years, also thinks this helps his joints  Former smoker Quit 2008 History of flare up with bronchitis episodes in past, had one bad episode with hemoptysis, had a bronchoscopy in hospital, did not identify any malignancy or other problem. He had a good report from bronchoscopy. He had a CTA Angiogram that identified a burst blood vessel. He has remained smoke free since that time. - No history of COPD diagnosed  History of kidney stone, lasted 6 hours, resolved w/ Aspirin, positional change to help it.  Health Maintenance: Due for High Dose Flu Shot, will receive today    Depression screen [facility] 2/9 05/08/2019 04/04/2018 09/06/2017  Decreased Interest 0 0 0  Down, Depressed, Hopeless 0 0 0  PHQ - 2 Score 0 0  0    Past Medical History:  Diagnosis Date  . Dyslipidemia   . Essential hypertension    Past Surgical History:  Procedure Laterality Date  . CATARACT EXTRACTION, BILATERAL     left 10/26/2014 right 10/12/2014  . TONSILLECTOMY AND ADENOIDECTOMY    . US ECHOCARDIOGRAPHY     10/2014  . VASECTOMY     Social History   Socioeconomic History  . Marital status: Married    Spouse name: Not on file  . Number of children: Not on file  . Years of education: Not on file  . Highest education level: Not on file  Occupational History  . Occupation: Retired Multimedia programmer)    [employer] in [place], traveled around [place], retired age 59  Social Needs  . Financial resource strain: Not on file  . Food insecurity    Worry: Not on file    Inability: Not on file  . Transportation needs    Medical: Not on file    Non-medical: Not on file  Tobacco Use  . Smoking status: Former Smoker    Types: Cigarettes    Quit date: 01/11/2007    Years since quitting: 12.3  . Smokeless tobacco: Never Used  Substance and Sexual Activity  . Alcohol use: Yes    Alcohol/week: 0.0 standard drinks    Comment: beer x2 weekly  . Drug use: No  . Sexual activity: Not on file  Lifestyle  . Physical activity    Days per week: Not on file    Minutes per session: Not on file  . Stress: Not on file  Relationships  . Social  connections    Talks on phone: Not on file    Gets together: Not on file    Attends religious service: Not on file    Active member of club or organization: Not on file    Attends meetings of clubs or organizations: Not on file    Relationship status: Not on file  . Intimate partner violence    Fear of current or ex partner: Not on file    Emotionally abused: Not on file    Physically abused: Not on file    Forced sexual activity: Not on file  Other Topics Concern  . Not on file  Social History Narrative  . Not on file   Family History  Problem Relation Age of Onset  . Heart  disease Brother   . Heart attack Brother        x 3  . Prostate cancer Neg Hx   . Colon cancer Neg Hx    Current Outpatient Medications on File Prior to Visit  Medication Sig  . aspirin EC 81 MG tablet Take 1 tablet (81 mg total) by mouth daily.  Marland Kitchen lisinopril (ZESTRIL) 40 MG tablet TAKE 1 TABLET BY MOUTH  DAILY  . Omega 3 1000 MG CAPS Take 1 capsule (1,000 mg total) by mouth 3 (three) times daily.  . simvastatin (ZOCOR) 40 MG tablet TAKE 1 TABLET BY MOUTH  DAILY   No current facility-administered medications on file prior to visit.     Review of Systems  Constitutional: Negative for activity change, appetite change, chills, diaphoresis, fatigue and fever.  HENT: Negative for congestion and hearing loss.   Eyes: Negative for discharge and visual disturbance.  Respiratory: Negative for apnea, cough, chest tightness, shortness of breath and wheezing.   Cardiovascular: Negative for chest pain, palpitations and leg swelling.  Gastrointestinal: Negative for abdominal pain, anal bleeding, blood in stool, constipation, diarrhea, nausea and vomiting.  Endocrine: Negative for cold intolerance.  Genitourinary: Negative for difficulty urinating, dysuria, frequency and hematuria.  Musculoskeletal: Negative for arthralgias, back pain and neck pain.  Skin: Negative for rash.  Allergic/Immunologic: Negative for environmental allergies.  Neurological: Negative for dizziness, weakness, light-headedness, numbness and headaches.  Hematological: Negative for adenopathy.  Psychiatric/Behavioral: Negative for behavioral problems, dysphoric mood and sleep disturbance. The patient is not nervous/anxious.    Per HPI unless specifically indicated above      Objective:    BP 138/62 (BP Location: Left Arm, Cuff Size: Normal)   Pulse 75   Ht 5\' 11"  (1.803 m)   Wt 206 lb 9.6 oz (93.7 kg)   BMI 28.81 kg/m   Wt Readings from Last 3 Encounters:  05/08/19 206 lb 9.6 oz (93.7 kg)  04/04/18 196 lb (88.9 kg)   09/06/17 188 lb (85.3 kg)    Physical Exam Vitals signs and nursing note reviewed.  Constitutional:      General: He is not in acute distress.    Appearance: He is well-developed. He is not diaphoretic.     Comments: Well-appearing, comfortable, cooperative  HENT:     Head: Normocephalic and atraumatic.  Eyes:     General:        Right eye: No discharge.        Left eye: No discharge.     Conjunctiva/sclera: Conjunctivae normal.     Pupils: Pupils are equal, round, and reactive to light.  Neck:     Musculoskeletal: Normal range of motion and neck supple.     Thyroid: No  thyromegaly.  Cardiovascular:     Rate and Rhythm: Normal rate and regular rhythm.     Heart sounds: Normal heart sounds. No murmur.  Pulmonary:     Effort: Pulmonary effort is normal. No respiratory distress.     Breath sounds: Normal breath sounds. No wheezing or rales.  Abdominal:     General: Bowel sounds are normal. There is no distension.     Palpations: Abdomen is soft. There is no mass.     Tenderness: There is no abdominal tenderness.  Musculoskeletal: Normal range of motion.        General: No tenderness.     Comments: Upper / Lower Extremities: - Normal muscle tone, strength bilateral upper extremities 5/5, lower extremities 5/5  Lymphadenopathy:     Cervical: No cervical adenopathy.  Skin:    General: Skin is warm and dry.     Findings: No erythema or rash.  Neurological:     Mental Status: He is alert and oriented to person, place, and time.     Comments: Distal sensation intact to light touch all extremities  Psychiatric:        Behavior: Behavior normal.     Comments: Well groomed, good eye contact, normal speech and thoughts    Results for orders placed or performed in visit on 05/01/19  COMPLETE METABOLIC PANEL WITH GFR  Result Value Ref Range   Glucose, Bld 108 (H) 65 - 99 mg/dL   BUN 22 7 - 25 mg/dL   Creat 0.80 0.70 - 1.18 mg/dL   GFR, Est Non African American 89 > OR = 60  mL/min/1.24m2   GFR, Est African American 103 > OR = 60 mL/min/1.59m2   BUN/Creatinine Ratio NOT APPLICABLE 6 - 22 (calc)   Sodium 140 135 - 146 mmol/L   Potassium 4.8 3.5 - 5.3 mmol/L   Chloride 105 98 - 110 mmol/L   CO2 29 20 - 32 mmol/L   Calcium 9.1 8.6 - 10.3 mg/dL   Total Protein 6.5 6.1 - 8.1 g/dL   Albumin 4.1 3.6 - 5.1 g/dL   Globulin 2.4 1.9 - 3.7 g/dL (calc)   AG Ratio 1.7 1.0 - 2.5 (calc)   Total Bilirubin 0.6 0.2 - 1.2 mg/dL   Alkaline phosphatase (APISO) 36 35 - 144 U/L   AST 23 10 - 35 U/L   ALT 17 9 - 46 U/L  PSA  Result Value Ref Range   PSA 0.3 < OR = 4.0 ng/mL  Lipid Profile  Result Value Ref Range   Cholesterol 156 <200 mg/dL   HDL 62 > OR = 40 mg/dL   Triglycerides 85 <150 mg/dL   LDL Cholesterol (Calc) 77 mg/dL (calc)   Total CHOL/HDL Ratio 2.5 <5.0 (calc)   Non-HDL Cholesterol (Calc) 94 <130 mg/dL (calc)  CBC with Differential  Result Value Ref Range   WBC 6.5 3.8 - 10.8 Thousand/uL   RBC 4.23 4.20 - 5.80 Million/uL   Hemoglobin 13.8 13.2 - 17.1 g/dL   HCT 41.2 38.5 - 50.0 %   MCV 97.4 80.0 - 100.0 fL   MCH 32.6 27.0 - 33.0 pg   MCHC 33.5 32.0 - 36.0 g/dL   RDW 12.5 11.0 - 15.0 %   Platelets 233 140 - 400 Thousand/uL   MPV 11.5 7.5 - 12.5 fL   Neutro Abs 3,718 1,500 - 7,800 cells/uL   Lymphs Abs 2,145 850 - 3,900 cells/uL   Absolute Monocytes 507 200 - 950 cells/uL   Eosinophils Absolute  78 15 - 500 cells/uL   Basophils Absolute 52 0 - 200 cells/uL   Neutrophils Relative % 57.2 %   Total Lymphocyte 33.0 %   Monocytes Relative 7.8 %   Eosinophils Relative 1.2 %   Basophils Relative 0.8 %  Hemoglobin A1c  Result Value Ref Range   Hgb A1c MFr Bld 5.4 <5.7 % of total Hgb   Mean Plasma Glucose 108 (calc)   eAG (mmol/L) 6.0 (calc)      Assessment & Plan:   Problem List Items Addressed This Visit    Screening for colon cancer   Overweight (BMI 25.0-29.9)   Hyperlipidemia   History of nephrolithiasis   Former smoker   Essential  hypertension   Elevated blood sugar    Other Visit Diagnoses    Annual physical exam    -  Primary   Needs flu shot       Relevant Orders   Flu Vaccine QUAD High Dose(Fluad) (Completed)      Updated Health Maintenance information Flu vaccine Reviewed recent lab results with patient Continue Simvastatin 40 and ASA 81, Omega 3 Encouraged improvement to lifestyle with diet and exercise Maintain healthy weight   No orders of the defined types were placed in this encounter.   Follow up plan: Return in about 1 year (around 05/07/2020) for Annual Physical.  Future labs 04/2020  Nobie Putnam, [facility] 05/08/2019, 2:20 PM

## 2019-05-08 NOTE — Patient Instructions (Addendum)
Thank you for coming to the office today.  Check with [facility] Medicare about the FIT (Feco-immunochemical test).  Flu shot today  DUE for FASTING BLOOD WORK (no food or drink after midnight before the lab appointment, only water or coffee without cream/sugar on the morning of)  SCHEDULE "Lab Only" visit in the morning at the clinic for lab draw in 1 YEAR  - Make sure Lab Only appointment is at about 1 week before your next appointment, so that results will be available  For Lab Results, once available within 2-3 days of blood draw, you can can log in to MyChart online to view your results and a brief explanation. Also, we can discuss results at next follow-up visit.   Please schedule a Follow-up Appointment to: Return in about 1 year (around 05/07/2020) for Annual Physical.  If you have any other questions or concerns, please feel free to call the office or send a message through [place]. You may also schedule an earlier appointment if necessary.  Additionally, you may be receiving a survey about your experience at our office within a few days to 1 week by e-mail or mail. We value your feedback.  Nobie Putnam, DO [place]

## 2019-05-10 ENCOUNTER — Other Ambulatory Visit: Payer: Self-pay | Admitting: Family Medicine

## 2019-05-10 DIAGNOSIS — I10 Essential (primary) hypertension: Secondary | ICD-10-CM

## 2019-05-10 DIAGNOSIS — E663 Overweight: Secondary | ICD-10-CM

## 2019-05-10 DIAGNOSIS — Z0000 Encounter for general adult medical examination without abnormal findings: Secondary | ICD-10-CM

## 2019-05-10 DIAGNOSIS — R351 Nocturia: Secondary | ICD-10-CM

## 2019-05-10 DIAGNOSIS — E782 Mixed hyperlipidemia: Secondary | ICD-10-CM

## 2019-05-10 DIAGNOSIS — R739 Hyperglycemia, unspecified: Secondary | ICD-10-CM

## 2019-09-07 ENCOUNTER — Ambulatory Visit: Payer: Medicare Other | Attending: Internal Medicine

## 2019-09-07 DIAGNOSIS — Z23 Encounter for immunization: Secondary | ICD-10-CM | POA: Insufficient documentation

## 2019-09-07 NOTE — Progress Notes (Signed)
   Covid-19 Vaccination Clinic  Name:  Jeff Ryan    MRN: [id number] DOB: [birth year]  09/07/2019  Jeff Ryan was observed post Covid-19 immunization for 15 minutes without incidence. He was provided with Vaccine Information Sheet and instruction to access the V-Safe system.   Jeff Ryan was instructed to call 911 with any severe reactions post vaccine: Marland Kitchen Difficulty breathing  . Swelling of your face and throat  . A fast heartbeat  . A bad rash all over your body  . Dizziness and weakness    Immunizations Administered    Name Date Dose VIS Date Route   Pfizer COVID-19 Vaccine 09/07/2019  8:57 AM 0.3 mL 06/23/2019 Intramuscular   Manufacturer: [place]   Lot: [id number]   [place]: [id number]

## 2019-10-03 ENCOUNTER — Ambulatory Visit: Payer: Medicare Other | Attending: Internal Medicine

## 2019-10-03 DIAGNOSIS — Z23 Encounter for immunization: Secondary | ICD-10-CM

## 2019-10-03 NOTE — Progress Notes (Signed)
   Covid-19 Vaccination Clinic  Name:  Jeff Ryan    MRN: [id number] DOB: [birth year]  10/03/2019  [name] was observed post Covid-19 immunization for 15 minutes without incident. He was provided with Vaccine Information Sheet and instruction to access the V-Safe system.   [name] was instructed to call 911 with any severe reactions post vaccine: Marland Kitchen Difficulty breathing  . Swelling of face and throat  . A fast heartbeat  . A bad rash all over body  . Dizziness and weakness   Immunizations Administered    Name Date Dose VIS Date Route   Pfizer COVID-19 Vaccine 10/03/2019  8:49 AM 0.3 mL 06/23/2019 Intramuscular   Manufacturer: [place]   Lot: [id number]   [place]: [id number]

## 2020-03-23 ENCOUNTER — Other Ambulatory Visit: Payer: Self-pay | Admitting: Family Medicine

## 2020-03-23 DIAGNOSIS — E7849 Other hyperlipidemia: Secondary | ICD-10-CM

## 2020-03-23 DIAGNOSIS — I10 Essential (primary) hypertension: Secondary | ICD-10-CM

## 2020-03-23 NOTE — Telephone Encounter (Signed)
Requested Prescriptions  Pending Prescriptions Disp Refills  . simvastatin (ZOCOR) 40 MG tablet [Pharmacy Med Name: Simvastatin 40 MG Oral Tablet] 90 tablet 3    Sig: TAKE 1 TABLET BY MOUTH  DAILY     Cardiovascular:  Antilipid - Statins Passed - 03/23/2020 10:13 PM      Passed - Total Cholesterol in normal range and within 360 days    Cholesterol, Total  Date Value Ref Range Status  04/15/2015 144 100 - 199 mg/dL Final   Cholesterol  Date Value Ref Range Status  05/01/2019 156 <200 mg/dL Final         Passed - LDL in normal range and within 360 days    LDL Cholesterol (Calc)  Date Value Ref Range Status  05/01/2019 77 mg/dL (calc) Final    Comment:    Reference range: <100 . Desirable range <100 mg/dL for primary prevention;   <70 mg/dL for patients with CHD or diabetic patients  with > or = 2 CHD risk factors. Marland Kitchen LDL-C is now calculated using the Martin-Hopkins  calculation, which is a validated novel method providing  better accuracy than the Friedewald equation in the  estimation of LDL-C.  Cresenciano Genre et al. Annamaria Helling. 8127;517(00): 2061-2068  (http://education.[facility].com/faq/FAQ164)          Passed - HDL in normal range and within 360 days    HDL  Date Value Ref Range Status  05/01/2019 62 > OR = 40 mg/dL Final  04/15/2015 63 >39 mg/dL Final    Comment:    According to ATP-III Guidelines, HDL-C >59 mg/dL is considered a negative risk factor for CHD.          Passed - Triglycerides in normal range and within 360 days    Triglycerides  Date Value Ref Range Status  05/01/2019 85 <150 mg/dL Final         Passed - Patient is not pregnant      Passed - Valid encounter within last 12 months    Recent Outpatient Visits          10 months ago Annual physical exam   [place], DO   1 year ago Elevated blood sugar   [facility] Olin Hauser, DO   2 years ago Annual physical exam   [facility] Olin Hauser, DO   3 years ago Essential hypertension   [facility] Olin Hauser, DO   3 years ago Essential hypertension   [facility] Arlis Porta., MD      Future Appointments            In 1 month [place], Devonne Doughty, DO [facility] [place]           . lisinopril (ZESTRIL) 40 MG tablet [Pharmacy Med Name: Lisinopril 40 MG Oral Tablet] 90 tablet 3    Sig: TAKE 1 TABLET BY MOUTH  DAILY     Cardiovascular:  ACE Inhibitors Failed - 03/23/2020 10:13 PM      Failed - Cr in normal range and within 180 days    Creat  Date Value Ref Range Status  05/01/2019 0.80 0.70 - 1.18 mg/dL Final    Comment:    For patients >76 years of age, the reference limit for Creatinine is approximately 13% higher for people identified as African-American. .          Failed - K in  normal range and within 180 days    Potassium  Date Value Ref Range Status  05/01/2019 4.8 3.5 - 5.3 mmol/L Final         Failed - Valid encounter within last 6 months    Recent Outpatient Visits          10 months ago Annual physical exam   [place], DO   1 year ago Elevated blood sugar   [facility] Olin Hauser, DO   2 years ago Annual physical exam   [facility] Olin Hauser, DO   3 years ago Essential hypertension   [facility] Olin Hauser, DO   3 years ago Essential hypertension   [facility] Arlis Porta., MD      Future Appointments            In 1 month [place], DO [facility] [place] - Patient is not pregnant      Passed - Last BP in normal range    BP Readings from Last 1 Encounters:  05/08/19 138/62

## 2020-03-23 NOTE — Telephone Encounter (Signed)
Requested medications are due for refill today?  Yes  Requested medications are on active medication list?  Yes  Last Refill:   05/02/2019  # 90 with 3 refills  Future visit scheduled?  Yes  Notes to Clinic:  Medication failed Rx refill protocol due to no valid encounter in the past 6 months and no labs within the past 180 days.  Last office visit was 10 months ago and last labs were performed on 05/01/2019.

## 2020-04-10 ENCOUNTER — Other Ambulatory Visit: Payer: Self-pay

## 2020-04-10 ENCOUNTER — Other Ambulatory Visit: Payer: Self-pay | Admitting: Family Medicine

## 2020-04-10 ENCOUNTER — Ambulatory Visit (INDEPENDENT_AMBULATORY_CARE_PROVIDER_SITE_OTHER): Payer: Medicare Other | Admitting: Family Medicine

## 2020-04-10 ENCOUNTER — Encounter: Payer: Self-pay | Admitting: Family Medicine

## 2020-04-10 VITALS — BP 134/80 | HR 62 | Temp 98.6°F | Resp 16 | Ht 71.0 in | Wt 223.6 lb

## 2020-04-10 DIAGNOSIS — Z87442 Personal history of urinary calculi: Secondary | ICD-10-CM

## 2020-04-10 DIAGNOSIS — R739 Hyperglycemia, unspecified: Secondary | ICD-10-CM

## 2020-04-10 DIAGNOSIS — Z0000 Encounter for general adult medical examination without abnormal findings: Secondary | ICD-10-CM

## 2020-04-10 DIAGNOSIS — E669 Obesity, unspecified: Secondary | ICD-10-CM

## 2020-04-10 DIAGNOSIS — Z23 Encounter for immunization: Secondary | ICD-10-CM | POA: Diagnosis not present

## 2020-04-10 DIAGNOSIS — I10 Essential (primary) hypertension: Secondary | ICD-10-CM

## 2020-04-10 DIAGNOSIS — R351 Nocturia: Secondary | ICD-10-CM

## 2020-04-10 DIAGNOSIS — E782 Mixed hyperlipidemia: Secondary | ICD-10-CM

## 2020-04-10 DIAGNOSIS — Z1211 Encounter for screening for malignant neoplasm of colon: Secondary | ICD-10-CM

## 2020-04-10 DIAGNOSIS — E7849 Other hyperlipidemia: Secondary | ICD-10-CM

## 2020-04-10 MED ORDER — SIMVASTATIN 40 MG PO TABS: 40.0000 mg | ORAL_TABLET | Freq: Every day | ORAL | 3 refills | Status: DC

## 2020-04-10 MED ORDER — LISINOPRIL 40 MG PO TABS: 40.0000 mg | ORAL_TABLET | Freq: Every day | ORAL | 3 refills | Status: DC

## 2020-04-10 NOTE — Patient Instructions (Addendum)
Thank you for coming to the office today.  High dose flu shot today  October 26 or later for Annual Physical for insurance purposes  Call insurance to check if they can order the FIT fecal immunochemical testing for you. Stool cards   DUE for FASTING BLOOD WORK (no food or drink after midnight before the lab appointment, only water or coffee without cream/sugar on the morning of)  SCHEDULE "Lab Only" visit in the morning at the clinic for lab draw in 3 WEEKS   - Make sure Lab Only appointment is at about 1 week before your next appointment, so that results will be available  For Lab Results, once available within 2-3 days of blood draw, you can can log in to MyChart online to view your results and a brief explanation. Also, we can discuss results at next follow-up visit.   Please schedule a Follow-up Appointment to: Return in about 4 weeks (around 05/08/2020) for Follow-up 3 weeks for fasting blood work then 1 week later Annual Physical.  If you have any other questions or concerns, please feel free to call the office or send a message through [place]. You may also schedule an earlier appointment if necessary.  Additionally, you may be receiving a survey about your experience at our office within a few days to 1 week by e-mail or mail. We value your feedback.  Nobie Putnam, DO [place]

## 2020-04-10 NOTE — Assessment & Plan Note (Signed)
Encouraged continue healthy lifestyle w/ goals to maintain wt 

## 2020-04-10 NOTE — Assessment & Plan Note (Signed)
Stable without recurrent flare recently No kidney stones >6 months On Kidney COP supplement

## 2020-04-10 NOTE — Progress Notes (Signed)
Subjective:    Patient ID: Jeff Ryan, male    DOB: 1945/11/09, 74 y.o.   MRN: [id number]  Jeff Ryan is a 74 y.o. male presenting on 04/10/2020 for Hypertension   HPI   Obesity BMI >31 CBGs:Not checking. Meds:Never on meds Currently on ACEi Lifestyle: - Diet (Continues healthy diet -limits sugars and carbs, salads)  - Exercise (He continues good exercise regimen with3 miles walking 7 days a week, treadmill for 1 hour, and yardworkhere he has a garden and works on this regularly) Denies hypoglycemia, polyuria, visual changes, numbness or tingling.  CHRONIC HTN: Reportschecks BP at home occasionally has electronic cuff,unchangedrecent readings, higher readings initially here in office, then repeat is improved. Current Meds -Lisinopril 40mg  Reports good compliance, took meds today. Tolerating well, w/o complaints. Denies CP, dyspnea, HA, edema, dizziness / lightheadedness  HYPERLIPIDEMIA: - Reports no concerns. Last lipid 04/2019,wellcontrolled - Currently takingSimvastatin 40mg , tolerating well without side effects or myalgias - Taking Omega 3 fish oil 1000mg  x 3 daily, for many years, also thinks this helps his joints  Former smoker Quit 2008 History of flare up with bronchitis episodes in past, had one bad episode with hemoptysis, had a bronchoscopy in hospital, did not identify any malignancy or other problem. He had a good report from bronchoscopy. He had a CTA Angiogram that identified a burst blood vessel. He has remained smoke free since that time. - No history of COPD diagnosed  History of kidney stones - now on Kidney COP (Calcium Oxalate Protector) supplement OTC with minimal problems in months.  Health Maintenance: Due for High Dose Flu Shot, will receive today   Depression screen [facility] 2/9 04/10/2020 05/08/2019 04/04/2018  Decreased Interest 0 0 0  Down, Depressed, Hopeless 0 0 0  PHQ - 2 Score 0 0 0    Social History   Tobacco  Use  . Smoking status: Former Smoker    Types: Cigarettes    Quit date: 01/11/2007    Years since quitting: 13.2  . Smokeless tobacco: Former Network engineer  . Vaping Use: Never used  Substance Use Topics  . Alcohol use: Yes    Alcohol/week: 0.0 standard drinks    Comment: beer x2 weekly  . Drug use: No    Review of Systems Per HPI unless specifically indicated above     Objective:    BP 134/80 (BP Location: Left Arm, Cuff Size: Normal)   Pulse 62   Temp 98.6 F (37 C) (Temporal)   Resp 16   Ht 5\' 11"  (1.803 m)   Wt 223 lb 9.6 oz (101.4 kg)   SpO2 97%   BMI 31.19 kg/m   Wt Readings from Last 3 Encounters:  04/10/20 223 lb 9.6 oz (101.4 kg)  05/08/19 206 lb 9.6 oz (93.7 kg)  04/04/18 196 lb (88.9 kg)    Physical Exam Vitals and nursing note reviewed.  Constitutional:      General: He is not in acute distress.    Appearance: He is well-developed. He is not diaphoretic.     Comments: Well-appearing, comfortable, cooperative  HENT:     Head: Normocephalic and atraumatic.  Eyes:     General:        Right eye: No discharge.        Left eye: No discharge.     Conjunctiva/sclera: Conjunctivae normal.  Neck:     Thyroid: No thyromegaly.  Cardiovascular:     Rate and Rhythm: Normal rate and regular rhythm.  Heart sounds: Normal heart sounds. No murmur heard.   Pulmonary:     Effort: Pulmonary effort is normal. No respiratory distress.     Breath sounds: Normal breath sounds. No wheezing or rales.  Musculoskeletal:        General: Normal range of motion.     Cervical back: Normal range of motion and neck supple.     Right lower leg: No edema.     Left lower leg: No edema.  Lymphadenopathy:     Cervical: No cervical adenopathy.  Skin:    General: Skin is warm and dry.     Findings: No erythema or rash.  Neurological:     Mental Status: He is alert and oriented to person, place, and time.  Psychiatric:        Behavior: Behavior normal.     Comments: Well  groomed, good eye contact, normal speech and thoughts        Results for orders placed or performed in visit on 05/01/19  COMPLETE METABOLIC PANEL WITH GFR  Result Value Ref Range   Glucose, Bld 108 (H) 65 - 99 mg/dL   BUN 22 7 - 25 mg/dL   Creat 0.80 0.70 - 1.18 mg/dL   GFR, Est Non African American 89 > OR = 60 mL/min/1.72m2   GFR, Est African American 103 > OR = 60 mL/min/1.75m2   BUN/Creatinine Ratio NOT APPLICABLE 6 - 22 (calc)   Sodium 140 135 - 146 mmol/L   Potassium 4.8 3.5 - 5.3 mmol/L   Chloride 105 98 - 110 mmol/L   CO2 29 20 - 32 mmol/L   Calcium 9.1 8.6 - 10.3 mg/dL   Total Protein 6.5 6.1 - 8.1 g/dL   Albumin 4.1 3.6 - 5.1 g/dL   Globulin 2.4 1.9 - 3.7 g/dL (calc)   AG Ratio 1.7 1.0 - 2.5 (calc)   Total Bilirubin 0.6 0.2 - 1.2 mg/dL   Alkaline phosphatase (APISO) 36 35 - 144 U/L   AST 23 10 - 35 U/L   ALT 17 9 - 46 U/L  PSA  Result Value Ref Range   PSA 0.3 < OR = 4.0 ng/mL  Lipid Profile  Result Value Ref Range   Cholesterol 156 <200 mg/dL   HDL 62 > OR = 40 mg/dL   Triglycerides 85 <150 mg/dL   LDL Cholesterol (Calc) 77 mg/dL (calc)   Total CHOL/HDL Ratio 2.5 <5.0 (calc)   Non-HDL Cholesterol (Calc) 94 <130 mg/dL (calc)  CBC with Differential  Result Value Ref Range   WBC 6.5 3.8 - 10.8 Thousand/uL   RBC 4.23 4.20 - 5.80 Million/uL   Hemoglobin 13.8 13.2 - 17.1 g/dL   HCT 41.2 38 - 50 %   MCV 97.4 80.0 - 100.0 fL   MCH 32.6 27.0 - 33.0 pg   MCHC 33.5 32.0 - 36.0 g/dL   RDW 12.5 11.0 - 15.0 %   Platelets 233 140 - 400 Thousand/uL   MPV 11.5 7.5 - 12.5 fL   Neutro Abs 3,718 1,500 - 7,800 cells/uL   Lymphs Abs 2,145 850 - 3,900 cells/uL   Absolute Monocytes 507 200 - 950 cells/uL   Eosinophils Absolute 78 15 - 500 cells/uL   Basophils Absolute 52 0 - 200 cells/uL   Neutrophils Relative % 57.2 %   Total Lymphocyte 33.0 %   Monocytes Relative 7.8 %   Eosinophils Relative 1.2 %   Basophils Relative 0.8 %  Hemoglobin A1c  Result Value Ref Range  Hgb A1c MFr Bld 5.4 <5.7 % of total Hgb   Mean Plasma Glucose 108 (calc)   eAG (mmol/L) 6.0 (calc)      Assessment & Plan:   Problem List Items Addressed This Visit    Obesity (BMI 30.0-34.9)    Encouraged continue healthy lifestyle w/ goals to maintain wt      Hyperlipidemia    Previously controlled cholesterol on statin and lifestyle The 10-year ASCVD risk score Mikey Bussing DC Jr., et al., 2013) is: 24.7% Fam history MI CAD brother  Plan: 1. Continue current meds - Simvastatin 40mg  daily, Omega 3 2. Continue ASA 81mg  for primary ASCVD risk reduction 3. Encourage improved lifestyle - low carb/cholesterol, reduce portion size, continue improving regular exercise  Return 4 weeks, lipids      Relevant Medications   lisinopril (ZESTRIL) 40 MG tablet   simvastatin (ZOCOR) 40 MG tablet   History of nephrolithiasis    Stable without recurrent flare recently No kidney stones >6 months On Kidney COP supplement      Essential hypertension - Primary    Well-controlled HTN, repeat manual improved - Home BP readings normal  No known complications  Plan:  1. Continue current BP regimen - Lisinopril 40mg  daily 2. Encourage improved lifestyle - low sodium diet, regular exercise 3. Continue monitor BP outside office, bring readings to next visit, if persistently >140/90 or new symptoms notify office sooner      Relevant Medications   lisinopril (ZESTRIL) 40 MG tablet   simvastatin (ZOCOR) 40 MG tablet    Other Visit Diagnoses    Needs flu shot       Relevant Orders   Flu Vaccine QUAD High Dose(Fluad) (Completed)      Due for routine colon cancer screening. Never had colonoscopy (not interested), no family history colon cancer. - Discussion today about recommendations for either Colonoscopy or Cologuard screening, benefits and risks of screening, interested in Cologuard, understands that if positive then recommendation is for diagnostic colonoscopy to follow-up.  - Patient  advised to contact insurance first to learn cost, will notify us when ready for [place] to order Cologuard - already notified us on mychart after visit, placed order Cologuard now.   Meds ordered this encounter  Medications  . lisinopril (ZESTRIL) 40 MG tablet    Sig: Take 1 tablet (40 mg total) by mouth daily.    Dispense:  90 tablet    Refill:  3    Add refills on file  . simvastatin (ZOCOR) 40 MG tablet    Sig: Take 1 tablet (40 mg total) by mouth daily with supper.    Dispense:  90 tablet    Refill:  3    Add refills on file     Follow up plan: Return in about 4 weeks (around 05/08/2020) for Follow-up 3 weeks for fasting blood work then 1 week later Annual Physical.  Future labs ordered for 05/06/20 CMET, CBC, A1c, Lipid, PSA, TSH  Nobie Putnam, DO [facility] 04/10/2020, 11:15 AM

## 2020-04-10 NOTE — Assessment & Plan Note (Signed)
Well-controlled HTN, repeat manual improved - Home BP readings normal  No known complications  Plan:  1. Continue current BP regimen - Lisinopril 40mg  daily 2. Encourage improved lifestyle - low sodium diet, regular exercise 3. Continue monitor BP outside office, bring readings to next visit, if persistently >140/90 or new symptoms notify office sooner

## 2020-04-10 NOTE — Assessment & Plan Note (Signed)
Previously controlled cholesterol on statin and lifestyle The 10-year ASCVD risk score Mikey Bussing DC Jr., et al., 2013) is: 24.7% Fam history MI CAD brother  Plan: 1. Continue current meds - Simvastatin 40mg  daily, Omega 3 2. Continue ASA 81mg  for primary ASCVD risk reduction 3. Encourage improved lifestyle - low carb/cholesterol, reduce portion size, continue improving regular exercise  Return 4 weeks, lipids

## 2020-04-22 DIAGNOSIS — Z1211 Encounter for screening for malignant neoplasm of colon: Secondary | ICD-10-CM | POA: Diagnosis not present

## 2020-04-23 LAB — COLOGUARD: Cologuard: NEGATIVE

## 2020-04-26 LAB — COLOGUARD: COLOGUARD: NEGATIVE

## 2020-04-26 LAB — EXTERNAL GENERIC LAB PROCEDURE: COLOGUARD: NEGATIVE

## 2020-05-03 ENCOUNTER — Encounter: Payer: Self-pay | Admitting: Family Medicine

## 2020-05-06 ENCOUNTER — Other Ambulatory Visit: Payer: Medicare Other

## 2020-05-13 ENCOUNTER — Encounter: Payer: Medicare Other | Admitting: Family Medicine

## 2020-06-04 ENCOUNTER — Encounter: Payer: Medicare Other | Admitting: Family Medicine

## 2020-06-10 ENCOUNTER — Other Ambulatory Visit: Payer: Medicare Other

## 2020-06-14 ENCOUNTER — Other Ambulatory Visit: Payer: Self-pay | Admitting: *Deleted

## 2020-06-14 DIAGNOSIS — E669 Obesity, unspecified: Secondary | ICD-10-CM

## 2020-06-14 DIAGNOSIS — R351 Nocturia: Secondary | ICD-10-CM

## 2020-06-14 DIAGNOSIS — Z0000 Encounter for general adult medical examination without abnormal findings: Secondary | ICD-10-CM

## 2020-06-14 DIAGNOSIS — R739 Hyperglycemia, unspecified: Secondary | ICD-10-CM

## 2020-06-14 DIAGNOSIS — I10 Essential (primary) hypertension: Secondary | ICD-10-CM

## 2020-06-14 DIAGNOSIS — E782 Mixed hyperlipidemia: Secondary | ICD-10-CM

## 2020-06-17 ENCOUNTER — Other Ambulatory Visit: Payer: Medicare Other

## 2020-06-17 DIAGNOSIS — R739 Hyperglycemia, unspecified: Secondary | ICD-10-CM | POA: Diagnosis not present

## 2020-06-17 DIAGNOSIS — I10 Essential (primary) hypertension: Secondary | ICD-10-CM | POA: Diagnosis not present

## 2020-06-17 DIAGNOSIS — E782 Mixed hyperlipidemia: Secondary | ICD-10-CM | POA: Diagnosis not present

## 2020-06-18 LAB — COMPLETE METABOLIC PANEL WITH GFR
AG Ratio: 1.7 (calc) (ref 1.0–2.5)
ALT: 19 U/L (ref 9–46)
AST: 22 U/L (ref 10–35)
Albumin: 4.3 g/dL (ref 3.6–5.1)
Alkaline phosphatase (APISO): 45 U/L (ref 35–144)
BUN: 17 mg/dL (ref 7–25)
CO2: 27 mmol/L (ref 20–32)
Calcium: 9.2 mg/dL (ref 8.6–10.3)
Chloride: 103 mmol/L (ref 98–110)
Creat: 1.08 mg/dL (ref 0.70–1.18)
GFR, Est African American: 78 mL/min/{1.73_m2} (ref >=60)
GFR, Est Non African American: 67 mL/min/{1.73_m2} (ref >=60)
Globulin: 2.6 g/dL (calc) (ref 1.9–3.7)
Glucose, Bld: 92 mg/dL (ref 65–99)
Potassium: 4.2 mmol/L (ref 3.5–5.3)
Sodium: 139 mmol/L (ref 135–146)
Total Bilirubin: 0.7 mg/dL (ref 0.2–1.2)
Total Protein: 6.9 g/dL (ref 6.1–8.1)

## 2020-06-18 LAB — HEMOGLOBIN A1C
Hgb A1c MFr Bld: 5.6 % of total Hgb (ref <5.7)
Mean Plasma Glucose: 114 mg/dL
eAG (mmol/L): 6.3 mmol/L

## 2020-06-18 LAB — PSA: PSA: 0.37 ng/mL (ref <=4.0)

## 2020-06-18 LAB — LIPID PANEL
Cholesterol: 135 mg/dL (ref <200)
HDL: 57 mg/dL (ref >=40)
LDL Cholesterol (Calc): 61 mg/dL (calc)
Non-HDL Cholesterol (Calc): 78 mg/dL (calc) (ref <130)
Total CHOL/HDL Ratio: 2.4 (calc) (ref <5.0)
Triglycerides: 88 mg/dL (ref <150)

## 2020-06-18 LAB — CBC WITH DIFFERENTIAL/PLATELET
Absolute Monocytes: 462 cells/uL (ref 200–950)
Basophils Absolute: 40 cells/uL (ref 0–200)
Basophils Relative: 0.7 %
Eosinophils Absolute: 91 cells/uL (ref 15–500)
Eosinophils Relative: 1.6 %
HCT: 40.1 % (ref 38.5–50.0)
Hemoglobin: 13.5 g/dL (ref 13.2–17.1)
Lymphs Abs: 2001 cells/uL (ref 850–3900)
MCH: 32.4 pg (ref 27.0–33.0)
MCHC: 33.7 g/dL (ref 32.0–36.0)
MCV: 96.2 fL (ref 80.0–100.0)
MPV: 11.4 fL (ref 7.5–12.5)
Monocytes Relative: 8.1 %
Neutro Abs: 3107 cells/uL (ref 1500–7800)
Neutrophils Relative %: 54.5 %
Platelets: 235 10*3/uL (ref 140–400)
RBC: 4.17 10*6/uL — ABNORMAL LOW (ref 4.20–5.80)
RDW: 12.5 % (ref 11.0–15.0)
Total Lymphocyte: 35.1 %
WBC: 5.7 10*3/uL (ref 3.8–10.8)

## 2020-06-18 LAB — TSH: TSH: 1.88 mIU/L (ref 0.40–4.50)

## 2020-06-24 ENCOUNTER — Encounter: Payer: Medicare Other | Admitting: Family Medicine

## 2020-06-27 ENCOUNTER — Encounter: Payer: Self-pay | Admitting: Family Medicine

## 2020-06-27 ENCOUNTER — Ambulatory Visit (INDEPENDENT_AMBULATORY_CARE_PROVIDER_SITE_OTHER): Payer: Medicare Other | Admitting: Family Medicine

## 2020-06-27 ENCOUNTER — Other Ambulatory Visit: Payer: Self-pay

## 2020-06-27 VITALS — BP 118/67 | HR 74 | Temp 97.7°F | Resp 16 | Ht 71.0 in | Wt 228.6 lb

## 2020-06-27 DIAGNOSIS — E782 Mixed hyperlipidemia: Secondary | ICD-10-CM

## 2020-06-27 DIAGNOSIS — I10 Essential (primary) hypertension: Secondary | ICD-10-CM | POA: Diagnosis not present

## 2020-06-27 DIAGNOSIS — E669 Obesity, unspecified: Secondary | ICD-10-CM

## 2020-06-27 DIAGNOSIS — Z0000 Encounter for general adult medical examination without abnormal findings: Secondary | ICD-10-CM

## 2020-06-27 DIAGNOSIS — Z87442 Personal history of urinary calculi: Secondary | ICD-10-CM | POA: Diagnosis not present

## 2020-06-27 NOTE — Assessment & Plan Note (Signed)
Stable without recurrent flare recently No kidney stones >1 year On Kidney COP supplement

## 2020-06-27 NOTE — Assessment & Plan Note (Signed)
Overall controlled BP. Mild elevated in office. Improved. - Home BP readings normal  No known complications  Plan:  1. Continue current BP regimen - Lisinopril 40mg  daily 2. Encourage improved lifestyle - low sodium diet, regular exercise 3. Continue monitor BP outside office, bring readings to next visit, if persistently >140/90 or new symptoms notify office sooner

## 2020-06-27 NOTE — Assessment & Plan Note (Signed)
Encouraged continue healthy lifestyle w/ goals to maintain wt

## 2020-06-27 NOTE — Patient Instructions (Addendum)
Thank you for coming to the office today.  Keep up the great work  Check BP regularly to keep a watch, goal < 140/90 if persistent elevated, let me know  Try to limit carb/starches calories to help with weight.  DUE for FASTING BLOOD WORK (no food or drink after midnight before the lab appointment, only water or coffee without cream/sugar on the morning of)  SCHEDULE "Lab Only" visit in the morning at the clinic for lab draw in 1 YEAR  - Make sure Lab Only appointment is at about 1 week before your next appointment, so that results will be available  For Lab Results, once available within 2-3 days of blood draw, you can can log in to MyChart online to view your results and a brief explanation. Also, we can discuss results at next follow-up visit.   Please schedule a Follow-up Appointment to: Return in about 1 year (around 06/27/2021) for Annual Physical.  If you have any other questions or concerns, please feel free to call the office or send a message through [facility]. You may also schedule an earlier appointment if necessary.  Additionally, you may be receiving a survey about your experience at our office within a few days to 1 week by e-mail or mail. We value your feedback.  Nobie Putnam, DO [place]

## 2020-06-27 NOTE — Progress Notes (Signed)
Subjective:    Patient ID: Jeff Ryan, male    DOB: 08-24-1945, 74 y.o.   MRN: [id number]  Jeff Ryan is a 74 y.o. male presenting on 06/27/2020 for Annual Exam   HPI   Here for Annual Physical and Lab Review.  Obesity BMI >31 A1c 5.4 to 5.7 range. Mild elevated. Last lab 5.6 CBGs:Not checking. Meds:Never on meds Currently on ACEi Lifestyle: - Diet (Continues healthy diet -limits sugars and carbs, salads)  - Exercise (He continues good exercise regimen with2-3 miles walking 7 days a week, treadmill for 1 hour, and yardworkhere he has a garden and works on this regularly) Denies hypoglycemia, polyuria, visual changes, numbness or tingling.  CHRONIC HTN: Reportschecks BP at home occasionally has electronic cuff,unchangedrecent readings readings 110-120 / 60-80, higher readings initially here in office, then repeat is improved. Current Meds -Lisinopril 40mg  Reports good compliance, took meds today. Tolerating well, w/o complaints. Denies CP, dyspnea, HA, edema, dizziness / lightheadedness  HYPERLIPIDEMIA: - Reports no concerns. Last lipid 06/2020,wellcontrolled - Currently takingSimvastatin 40mg , tolerating well without side effects or myalgias - Taking Omega 3 fish oil 1000mg  x 3 daily, for many years, also thinks this helps his joints  Former smoker Quit 2008 History of flare up with bronchitis episodes in past, had one bad episode with hemoptysis, had a bronchoscopy in hospital, did not identify any malignancy or other problem. He had a good report from bronchoscopy. He had a CTA Angiogram that identified a burst blood vessel. He has remained smoke free since that time. - No history of COPD diagnosed  History of kidney stones - now on Kidney COP (Calcium Oxalate Protector) supplement OTC with minimal problems in months. One minor episode in 1 year.  Health Maintenance: Due forHigh DoseFlu Shot, will receive today  PSA 0.37 (06/2020)  negative.  Cologuard 04/2020 negative. Due next 3 years, 04/2023   Depression screen [facility] 2/9 06/27/2020 04/10/2020 05/08/2019  Decreased Interest 0 0 0  Down, Depressed, Hopeless 0 0 0  PHQ - 2 Score 0 0 0    Past Medical History:  Diagnosis Date  . Dyslipidemia   . Essential hypertension    Past Surgical History:  Procedure Laterality Date  . CATARACT EXTRACTION, BILATERAL     left 10/26/2014 right 10/12/2014  . TONSILLECTOMY AND ADENOIDECTOMY    . US ECHOCARDIOGRAPHY     10/2014  . VASECTOMY     Social History   Socioeconomic History  . Marital status: Married    Spouse name: Not on file  . Number of children: Not on file  . Years of education: Not on file  . Highest education level: Not on file  Occupational History  . Occupation: Retired Multimedia programmer)    [employer] in [place], traveled around [place], retired age 34  Tobacco Use  . Smoking status: Former Smoker    Types: Cigarettes    Quit date: 01/11/2007    Years since quitting: 13.4  . Smokeless tobacco: Former Network engineer  . Vaping Use: Never used  Substance and Sexual Activity  . Alcohol use: Yes    Alcohol/week: 0.0 standard drinks    Comment: beer x2 weekly  . Drug use: No  . Sexual activity: Not on file  Other Topics Concern  . Not on file  Social History Narrative  . Not on file   Social Determinants of Health   Financial Resource Strain: Not on file  Food Insecurity: Not on file  Transportation Needs: Not on  file  Physical Activity: Not on file  Stress: Not on file  Social Connections: Not on file  Intimate Partner Violence: Not on file   Family History  Problem Relation Age of Onset  . Heart disease Brother   . Heart attack Brother        x 3  . Prostate cancer Neg Hx   . Colon cancer Neg Hx    Current Outpatient Medications on File Prior to Visit  Medication Sig  . aspirin EC 81 MG tablet Take 1 tablet (81 mg total) by mouth daily.  Marland Kitchen lisinopril (ZESTRIL) 40 MG tablet  Take 1 tablet (40 mg total) by mouth daily.  . Omega 3 1000 MG CAPS Take 1 capsule (1,000 mg total) by mouth 3 (three) times daily.  Marland Kitchen OVER THE COUNTER MEDICATION Take 2 tablets by mouth 2 (two) times daily with a meal. Kidney Cop (Calcium Oxalate Protector)  . simvastatin (ZOCOR) 40 MG tablet Take 1 tablet (40 mg total) by mouth daily with supper.   No current facility-administered medications on file prior to visit.    Review of Systems  Constitutional: Negative for activity change, appetite change, chills, diaphoresis, fatigue and fever.  HENT: Negative for congestion and hearing loss.   Eyes: Negative for visual disturbance.  Respiratory: Negative for cough, chest tightness, shortness of breath and wheezing.   Cardiovascular: Negative for chest pain, palpitations and leg swelling.  Gastrointestinal: Negative for abdominal pain, constipation, diarrhea, nausea and vomiting.  Endocrine: Negative for cold intolerance.  Genitourinary: Negative for dysuria, frequency and hematuria.  Musculoskeletal: Negative for arthralgias and neck pain.  Skin: Negative for rash.  Allergic/Immunologic: Negative for environmental allergies.  Neurological: Negative for dizziness, weakness, light-headedness, numbness and headaches.  Hematological: Negative for adenopathy.  Psychiatric/Behavioral: Negative for behavioral problems, dysphoric mood and sleep disturbance.   Per HPI unless specifically indicated above      Objective:    BP 118/67 (BP Location: Left Arm, Cuff Size: Normal)   Pulse 74   Temp 97.7 F (36.5 C) (Temporal)   Resp 16   Ht 5\' 11"  (1.803 m)   Wt 228 lb 9.6 oz (103.7 kg)   SpO2 98%   BMI 31.88 kg/m   Wt Readings from Last 3 Encounters:  06/27/20 228 lb 9.6 oz (103.7 kg)  04/10/20 223 lb 9.6 oz (101.4 kg)  05/08/19 206 lb 9.6 oz (93.7 kg)    Physical Exam Vitals and nursing note reviewed.  Constitutional:      General: He is not in acute distress.    Appearance: He is  well-developed and well-nourished. He is not diaphoretic.     Comments: Well-appearing, comfortable, cooperative  HENT:     Head: Normocephalic and atraumatic.     Mouth/Throat:     Mouth: Oropharynx is clear and moist.  Eyes:     General:        Right eye: No discharge.        Left eye: No discharge.     Extraocular Movements: EOM normal.     Conjunctiva/sclera: Conjunctivae normal.     Pupils: Pupils are equal, round, and reactive to light.  Neck:     Thyroid: No thyromegaly.  Cardiovascular:     Rate and Rhythm: Normal rate and regular rhythm.     Pulses: Intact distal pulses.     Heart sounds: Normal heart sounds. No murmur heard.   Pulmonary:     Effort: Pulmonary effort is normal. No respiratory distress.  Breath sounds: Normal breath sounds. No wheezing or rales.  Abdominal:     General: Bowel sounds are normal. There is no distension.     Palpations: Abdomen is soft. There is no mass.     Tenderness: There is no abdominal tenderness.  Musculoskeletal:        General: No tenderness or edema. Normal range of motion.     Cervical back: Normal range of motion and neck supple.     Comments: Upper / Lower Extremities: - Normal muscle tone, strength bilateral upper extremities 5/5, lower extremities 5/5  Lymphadenopathy:     Cervical: No cervical adenopathy.  Skin:    General: Skin is warm and dry.     Findings: No erythema or rash.  Neurological:     Mental Status: He is alert and oriented to person, place, and time.     Comments: Distal sensation intact to light touch all extremities  Psychiatric:        Mood and Affect: Mood and affect normal.        Behavior: Behavior normal.     Comments: Well groomed, good eye contact, normal speech and thoughts       Results for orders placed or performed in visit on 06/14/20  TSH  Result Value Ref Range   TSH 1.88 0.40 - 4.50 mIU/L  PSA  Result Value Ref Range   PSA 0.37 < OR = 4.0 ng/mL  Lipid panel  Result Value  Ref Range   Cholesterol 135 <200 mg/dL   HDL 57 > OR = 40 mg/dL   Triglycerides 88 <150 mg/dL   LDL Cholesterol (Calc) 61 mg/dL (calc)   Total CHOL/HDL Ratio 2.4 <5.0 (calc)   Non-HDL Cholesterol (Calc) 78 <130 mg/dL (calc)  COMPLETE METABOLIC PANEL WITH GFR  Result Value Ref Range   Glucose, Bld 92 65 - 99 mg/dL   BUN 17 7 - 25 mg/dL   Creat 1.08 0.70 - 1.18 mg/dL   GFR, Est Non African American 67 > OR = 60 mL/min/1.63m2   GFR, Est African American 78 > OR = 60 mL/min/1.61m2   BUN/Creatinine Ratio NOT APPLICABLE 6 - 22 (calc)   Sodium 139 135 - 146 mmol/L   Potassium 4.2 3.5 - 5.3 mmol/L   Chloride 103 98 - 110 mmol/L   CO2 27 20 - 32 mmol/L   Calcium 9.2 8.6 - 10.3 mg/dL   Total Protein 6.9 6.1 - 8.1 g/dL   Albumin 4.3 3.6 - 5.1 g/dL   Globulin 2.6 1.9 - 3.7 g/dL (calc)   AG Ratio 1.7 1.0 - 2.5 (calc)   Total Bilirubin 0.7 0.2 - 1.2 mg/dL   Alkaline phosphatase (APISO) 45 35 - 144 U/L   AST 22 10 - 35 U/L   ALT 19 9 - 46 U/L  CBC with Differential/Platelet  Result Value Ref Range   WBC 5.7 3.8 - 10.8 Thousand/uL   RBC 4.17 (L) 4.20 - 5.80 Million/uL   Hemoglobin 13.5 13.2 - 17.1 g/dL   HCT 40.1 38.5 - 50.0 %   MCV 96.2 80.0 - 100.0 fL   MCH 32.4 27.0 - 33.0 pg   MCHC 33.7 32.0 - 36.0 g/dL   RDW 12.5 11.0 - 15.0 %   Platelets 235 140 - 400 Thousand/uL   MPV 11.4 7.5 - 12.5 fL   Neutro Abs 3,107 1,500 - 7,800 cells/uL   Lymphs Abs 2,001 850 - 3,900 cells/uL   Absolute Monocytes 462 200 - 950 cells/uL  Eosinophils Absolute 91 15 - 500 cells/uL   Basophils Absolute 40 0 - 200 cells/uL   Neutrophils Relative % 54.5 %   Total Lymphocyte 35.1 %   Monocytes Relative 8.1 %   Eosinophils Relative 1.6 %   Basophils Relative 0.7 %  Hemoglobin A1c  Result Value Ref Range   Hgb A1c MFr Bld 5.6 <5.7 % of total Hgb   Mean Plasma Glucose 114 mg/dL   eAG (mmol/L) 6.3 mmol/L      Assessment & Plan:   Problem List Items Addressed This Visit    Obesity (BMI 30.0-34.9)     Encouraged continue healthy lifestyle w/ goals to maintain wt      Hyperlipidemia    Previously controlled cholesterol on statin and lifestyle The 10-year ASCVD risk score Mikey Bussing DC Jr., et al., 2013) is: 19.8% Fam history MI CAD brother  Plan: 1. Continue current meds - Simvastatin 40mg  daily, Omega 3 2. Continue ASA 81mg  for primary ASCVD risk reduction 3. Encourage improved lifestyle - low carb/cholesterol, reduce portion size, continue improving regular exercise      History of nephrolithiasis    Stable without recurrent flare recently No kidney stones >1 year On Kidney COP supplement      Essential hypertension    Overall controlled BP. Mild elevated in office. Improved. - Home BP readings normal  No known complications  Plan:  1. Continue current BP regimen - Lisinopril 40mg  daily 2. Encourage improved lifestyle - low sodium diet, regular exercise 3. Continue monitor BP outside office, bring readings to next visit, if persistently >140/90 or new symptoms notify office sooner       Other Visit Diagnoses    Annual physical exam    -  Primary      Updated Health Maintenance information Reviewed recent lab results with patient Encouraged improvement to lifestyle with diet and exercise - Goal of weight loss   No orders of the defined types were placed in this encounter.     Follow up plan: Return in about 1 year (around 06/27/2021) for Annual Physical.  Nobie Putnam, DO [facility] 06/27/2020, 1:39 PM

## 2020-06-27 NOTE — Assessment & Plan Note (Signed)
Previously controlled cholesterol on statin and lifestyle The 10-year ASCVD risk score Jeff Bussing DC Jr., et al., 2013) is: 19.8% Fam history MI CAD brother  Plan: 1. Continue current meds - Simvastatin 40mg  daily, Omega 3 2. Continue ASA 81mg  for primary ASCVD risk reduction 3. Encourage improved lifestyle - low carb/cholesterol, reduce portion size, continue improving regular exercise

## 2021-02-13 ENCOUNTER — Other Ambulatory Visit: Payer: Self-pay | Admitting: Family Medicine

## 2021-02-13 DIAGNOSIS — E7849 Other hyperlipidemia: Secondary | ICD-10-CM

## 2021-02-13 DIAGNOSIS — I10 Essential (primary) hypertension: Secondary | ICD-10-CM

## 2021-02-13 NOTE — Telephone Encounter (Signed)
Requested Prescriptions  Pending Prescriptions Disp Refills  . simvastatin (ZOCOR) 40 MG tablet [Pharmacy Med Name: Simvastatin 40 MG Oral Tablet] 90 tablet 1    Sig: TAKE 1 TABLET BY MOUTH  DAILY WITH SUPPER     Cardiovascular:  Antilipid - Statins Passed - 02/13/2021 12:27 AM      Passed - Total Cholesterol in normal range and within 360 days    Cholesterol, Total  Date Value Ref Range Status  04/15/2015 144 100 - 199 mg/dL Final   Cholesterol  Date Value Ref Range Status  06/17/2020 135 <200 mg/dL Final         Passed - LDL in normal range and within 360 days    LDL Cholesterol (Calc)  Date Value Ref Range Status  06/17/2020 61 mg/dL (calc) Final    Comment:    Reference range: <100 . Desirable range <100 mg/dL for primary prevention;   <70 mg/dL for patients with CHD or diabetic patients  with > or = 2 CHD risk factors. Marland Kitchen LDL-C is now calculated using the Martin-Hopkins  calculation, which is a validated novel method providing  better accuracy than the Friedewald equation in the  estimation of LDL-C.  Cresenciano Genre et al. Annamaria Helling. [id number]): [id number]  (http://education.[facility].com/faq/FAQ164)          Passed - HDL in normal range and within 360 days    HDL  Date Value Ref Range Status  06/17/2020 57 > OR = 40 mg/dL Final  04/15/2015 63 >39 mg/dL Final    Comment:    According to ATP-III Guidelines, HDL-C >59 mg/dL is considered a negative risk factor for CHD.          Passed - Triglycerides in normal range and within 360 days    Triglycerides  Date Value Ref Range Status  06/17/2020 88 <150 mg/dL Final         Passed - Patient is not pregnant      Passed - Valid encounter within last 12 months    Recent Outpatient Visits          7 months ago Annual physical exam   [facility] Olin Hauser, DO   10 months ago Essential hypertension   [place], DO   1 year ago Annual physical  exam   [place], DO   2 years ago Elevated blood sugar   [place], DO   3 years ago Annual physical exam   [facility], Devonne Doughty, DO             . lisinopril (ZESTRIL) 40 MG tablet [Pharmacy Med Name: Lisinopril 40 MG Oral Tablet] 90 tablet 3    Sig: TAKE 1 TABLET BY MOUTH  DAILY     Cardiovascular:  ACE Inhibitors Failed - 02/13/2021 12:27 AM      Failed - Cr in normal range and within 180 days    Creat  Date Value Ref Range Status  06/17/2020 1.08 0.70 - 1.18 mg/dL Final    Comment:    For patients >22 years of age, the reference limit for Creatinine is approximately 13% higher for people identified as African-American. .          Failed - K in normal range and within 180 days    Potassium  Date Value Ref Range Status  06/17/2020 4.2 3.5 - 5.3 mmol/L Final  Failed - Valid encounter within last 6 months    Recent Outpatient Visits          7 months ago Annual physical exam   [facility] Olin Hauser, DO   10 months ago Essential hypertension   [place], DO   1 year ago Annual physical exam   [place], DO   2 years ago Elevated blood sugar   [place], DO   3 years ago Annual physical exam   [place], DO             Passed - Patient is not pregnant      Passed - Last BP in normal range    BP Readings from Last 1 Encounters:  06/27/20 118/67

## 2021-02-13 NOTE — Telephone Encounter (Signed)
Patient needs appointment.

## 2021-03-28 ENCOUNTER — Encounter: Payer: Self-pay | Admitting: Family Medicine

## 2021-06-23 ENCOUNTER — Ambulatory Visit (INDEPENDENT_AMBULATORY_CARE_PROVIDER_SITE_OTHER): Payer: Medicare Other | Admitting: Family Medicine

## 2021-06-23 ENCOUNTER — Other Ambulatory Visit: Payer: Self-pay

## 2021-06-23 ENCOUNTER — Encounter: Payer: Self-pay | Admitting: Family Medicine

## 2021-06-23 VITALS — BP 150/90 | HR 90 | Ht 71.0 in | Wt 245.0 lb

## 2021-06-23 DIAGNOSIS — Z23 Encounter for immunization: Secondary | ICD-10-CM | POA: Diagnosis not present

## 2021-06-23 DIAGNOSIS — R739 Hyperglycemia, unspecified: Secondary | ICD-10-CM | POA: Diagnosis not present

## 2021-06-23 DIAGNOSIS — R351 Nocturia: Secondary | ICD-10-CM

## 2021-06-23 DIAGNOSIS — E782 Mixed hyperlipidemia: Secondary | ICD-10-CM | POA: Diagnosis not present

## 2021-06-23 DIAGNOSIS — Z0000 Encounter for general adult medical examination without abnormal findings: Secondary | ICD-10-CM

## 2021-06-23 DIAGNOSIS — I10 Essential (primary) hypertension: Secondary | ICD-10-CM | POA: Diagnosis not present

## 2021-06-23 MED ORDER — HYDROCHLOROTHIAZIDE 25 MG PO TABS: 25.0000 mg | ORAL_TABLET | Freq: Every day | ORAL | 3 refills | Status: DC

## 2021-06-23 NOTE — Patient Instructions (Addendum)
Thank you for coming to the office today.  4-6 weeks return for BP re-check, can do lab after.  Start HCTZ once daily, 25mg  tablets, you can START WITH HALF tablet for dose 12.5mg  daily, if ultimately not successful can take WHOLE PILL. Keep an eye on the BP goal < 140 / 90.  Pneumonia shot final today.  Please schedule a Follow-up Appointment to: Return in about 4 weeks (around 07/21/2021) for 4 weeks HTN LAB AFTER + Flu Shot.  If you have any other questions or concerns, please feel free to call the office or send a message through [place]. You may also schedule an earlier appointment if necessary.  Additionally, you may be receiving a survey about your experience at our office within a few days to 1 week by e-mail or mail. We value your feedback.  Nobie Putnam, DO [place]

## 2021-06-23 NOTE — Assessment & Plan Note (Signed)
Previously controlled cholesterol on statin and lifestyle The 10-year ASCVD risk score (Arnett DK, et al., 2019) is: 30.9% Fam history MI CAD brother  Plan: 1. Continue current meds - Simvastatin 40mg  daily, Omega 3 2. Continue ASA 81mg  for primary ASCVD risk reduction 3. Encourage improved lifestyle - low carb/cholesterol, reduce portion size, continue improving regular exercise

## 2021-06-23 NOTE — Assessment & Plan Note (Signed)
Elevated BP readings Continue Lisinopril 40mg  Start HCTZ once daily, 25mg  tablets, you can START WITH HALF tablet for dose 12.5mg  daily, if ultimately not successful can take WHOLE PILL. Keep an eye on the BP goal < 140 / 90. 4-6 weeks return for BP re-check, can do lab after.

## 2021-06-23 NOTE — Progress Notes (Signed)
Subjective:    Patient ID: Jeff Ryan, male    DOB: Apr 15, 1946, 75 y.o.   MRN: [id number]  Jeff Ryan is a 75 y.o. male presenting on 06/23/2021 for Hyperlipidemia and Hypertension   HPI  Here for Annual Physical and Lab Review.  Obesity BMI >34 A1c 5.4 to 5.7 range. Mild elevated. Last lab 5.6 - due for repeat labs. CBGs: Not checking. Meds: Never on meds Currently on ACEi Lifestyle: - Diet (Continues healthy diet - limits sugars and carbs, salads)  - Exercise (He continues good exercise regimen with 2-3 miles walking 7 days a week, treadmill for 1 hour, and yardwork here he has a garden and works on this regularly) Denies hypoglycemia, polyuria, visual changes, numbness or tingling.   CHRONIC HTN: Elevated BP lately Current Meds - Lisinopril 40mg    Reports good compliance, took meds today. Tolerating well, w/o complaints. Denies CP, dyspnea, HA, edema, dizziness / lightheadedness   HYPERLIPIDEMIA: - Reports no concerns. Last lipid 06/2020, well controlled - Currently taking Simvastatin 40mg , tolerating well without side effects or myalgias - Taking Omega 3 fish oil 1000mg  x 3 daily, for many years, also thinks this helps his joints   Former smoker Quit 2008 History of flare up with bronchitis episodes in past, had one bad episode with hemoptysis, had a bronchoscopy in hospital, did not identify any malignancy or other problem. He had a good report from bronchoscopy. He had a CTA Angiogram that identified a burst blood vessel. He has remained smoke free since that time. - No history of COPD diagnosed   History of kidney stones - now on Kidney COP (Calcium Oxalate Protector) supplement OTC with minimal problems in months. One minor episode in 1 year.    Health Maintenance: Flu shot in future if ready  Pneumonia vaccine update PCV 20 today.   PSA 0.37 (06/2020) negative. - due for repeat.   Cologuard 04/2020 negative. Due next 3 years,  04/2023   Depression screen [facility] 2/9 06/27/2020 04/10/2020 05/08/2019  Decreased Interest 0 0 0  Down, Depressed, Hopeless 0 0 0  PHQ - 2 Score 0 0 0    Past Medical History:  Diagnosis Date   Dyslipidemia    Essential hypertension    Past Surgical History:  Procedure Laterality Date   CATARACT EXTRACTION, BILATERAL     left 10/26/2014 right 10/12/2014   TONSILLECTOMY AND ADENOIDECTOMY     US ECHOCARDIOGRAPHY     10/2014   VASECTOMY     Social History   Socioeconomic History   Marital status: Married    Spouse name: Not on file   Number of children: Not on file   Years of education: Not on file   Highest education level: Not on file  Occupational History   Occupation: Retired Multimedia programmer)    Comment: Company in [place], traveled around [place], retired age 56  Tobacco Use   Smoking status: Former    Types: Cigarettes    Quit date: 01/11/2007    Years since quitting: 14.4   Smokeless tobacco: Former  Scientific laboratory technician Use: Never used  Substance and Sexual Activity   Alcohol use: Yes    Alcohol/week: 0.0 standard drinks    Comment: beer x2 weekly   Drug use: No   Sexual activity: Not on file  Other Topics Concern   Not on file  Social History Narrative   Not on file   Social Determinants of Health   Financial Resource Strain: Not on  file  Food Insecurity: Not on file  Transportation Needs: Not on file  Physical Activity: Not on file  Stress: Not on file  Social Connections: Not on file  Intimate Partner Violence: Not on file   Family History  Problem Relation Age of Onset   Heart disease Brother    Heart attack Brother        x 3   Prostate cancer Neg Hx    Colon cancer Neg Hx    Current Outpatient Medications on File Prior to Visit  Medication Sig   aspirin EC 81 MG tablet Take 1 tablet (81 mg total) by mouth daily.   lisinopril (ZESTRIL) 40 MG tablet TAKE 1 TABLET BY MOUTH  DAILY   Omega 3 1000 MG CAPS Take 1 capsule (1,000 mg total) by mouth 3  (three) times daily.   OVER THE COUNTER MEDICATION Take 2 tablets by mouth 2 (two) times daily with a meal. Kidney Cop (Calcium Oxalate Protector)   simvastatin (ZOCOR) 40 MG tablet TAKE 1 TABLET BY MOUTH  DAILY WITH SUPPER   No current facility-administered medications on file prior to visit.    Review of Systems  Constitutional:  Negative for activity change, appetite change, chills, diaphoresis, fatigue and fever.  HENT:  Negative for congestion and hearing loss.   Eyes:  Negative for visual disturbance.  Respiratory:  Negative for cough, chest tightness, shortness of breath and wheezing.   Cardiovascular:  Negative for chest pain, palpitations and leg swelling.  Gastrointestinal:  Negative for abdominal pain, constipation, diarrhea, nausea and vomiting.  Genitourinary:  Negative for dysuria, frequency and hematuria.  Musculoskeletal:  Negative for arthralgias and neck pain.  Skin:  Negative for rash.  Neurological:  Negative for dizziness, weakness, light-headedness, numbness and headaches.  Hematological:  Negative for adenopathy.  Psychiatric/Behavioral:  Negative for behavioral problems, dysphoric mood and sleep disturbance.   Per HPI unless specifically indicated above      Objective:    BP (!) 150/90 (BP Location: Left Arm, Cuff Size: Normal)   Pulse 90   Ht 5\' 11"  (1.803 m)   Wt 245 lb (111.1 kg)   SpO2 100%   BMI 34.17 kg/m   Wt Readings from Last 3 Encounters:  06/23/21 245 lb (111.1 kg)  06/27/20 228 lb 9.6 oz (103.7 kg)  04/10/20 223 lb 9.6 oz (101.4 kg)    Physical Exam Vitals and nursing note reviewed.  Constitutional:      General: He is not in acute distress.    Appearance: He is well-developed. He is not diaphoretic.     Comments: Well-appearing, comfortable, cooperative  HENT:     Head: Normocephalic and atraumatic.  Eyes:     General:        Right eye: No discharge.        Left eye: No discharge.     Conjunctiva/sclera: Conjunctivae normal.      Pupils: Pupils are equal, round, and reactive to light.  Neck:     Thyroid: No thyromegaly.  Cardiovascular:     Rate and Rhythm: Normal rate and regular rhythm.     Pulses: Normal pulses.     Heart sounds: Normal heart sounds. No murmur heard. Pulmonary:     Effort: Pulmonary effort is normal. No respiratory distress.     Breath sounds: Normal breath sounds. No wheezing or rales.  Abdominal:     General: Bowel sounds are normal. There is no distension.     Palpations: Abdomen is soft. There  is no mass.     Tenderness: There is no abdominal tenderness.  Musculoskeletal:        General: No tenderness. Normal range of motion.     Cervical back: Normal range of motion and neck supple.     Comments: Upper / Lower Extremities: - Normal muscle tone, strength bilateral upper extremities 5/5, lower extremities 5/5  Lymphadenopathy:     Cervical: No cervical adenopathy.  Skin:    General: Skin is warm and dry.     Findings: No erythema or rash.  Neurological:     Mental Status: He is alert and oriented to person, place, and time.     Comments: Distal sensation intact to light touch all extremities  Psychiatric:        Mood and Affect: Mood normal.        Behavior: Behavior normal.        Thought Content: Thought content normal.     Comments: Well groomed, good eye contact, normal speech and thoughts   Results for orders placed or performed in visit on 06/14/20  TSH  Result Value Ref Range   TSH 1.88 0.40 - 4.50 mIU/L  PSA  Result Value Ref Range   PSA 0.37 < OR = 4.0 ng/mL  Lipid panel  Result Value Ref Range   Cholesterol 135 <200 mg/dL   HDL 57 > OR = 40 mg/dL   Triglycerides 88 <150 mg/dL   LDL Cholesterol (Calc) 61 mg/dL (calc)   Total CHOL/HDL Ratio 2.4 <5.0 (calc)   Non-HDL Cholesterol (Calc) 78 <130 mg/dL (calc)  COMPLETE METABOLIC PANEL WITH GFR  Result Value Ref Range   Glucose, Bld 92 65 - 99 mg/dL   BUN 17 7 - 25 mg/dL   Creat 1.08 0.70 - 1.18 mg/dL   GFR, Est  Non African American 67 > OR = 60 mL/min/1.43m2   GFR, Est African American 78 > OR = 60 mL/min/1.49m2   BUN/Creatinine Ratio NOT APPLICABLE 6 - 22 (calc)   Sodium 139 135 - 146 mmol/L   Potassium 4.2 3.5 - 5.3 mmol/L   Chloride 103 98 - 110 mmol/L   CO2 27 20 - 32 mmol/L   Calcium 9.2 8.6 - 10.3 mg/dL   Total Protein 6.9 6.1 - 8.1 g/dL   Albumin 4.3 3.6 - 5.1 g/dL   Globulin 2.6 1.9 - 3.7 g/dL (calc)   AG Ratio 1.7 1.0 - 2.5 (calc)   Total Bilirubin 0.7 0.2 - 1.2 mg/dL   Alkaline phosphatase (APISO) 45 35 - 144 U/L   AST 22 10 - 35 U/L   ALT 19 9 - 46 U/L  CBC with Differential/Platelet  Result Value Ref Range   WBC 5.7 3.8 - 10.8 Thousand/uL   RBC 4.17 (L) 4.20 - 5.80 Million/uL   Hemoglobin 13.5 13.2 - 17.1 g/dL   HCT 40.1 38.5 - 50.0 %   MCV 96.2 80.0 - 100.0 fL   MCH 32.4 27.0 - 33.0 pg   MCHC 33.7 32.0 - 36.0 g/dL   RDW 12.5 11.0 - 15.0 %   Platelets 235 140 - 400 Thousand/uL   MPV 11.4 7.5 - 12.5 fL   Neutro Abs 3,107 1,500 - 7,800 cells/uL   Lymphs Abs 2,001 850 - 3,900 cells/uL   Absolute Monocytes 462 200 - 950 cells/uL   Eosinophils Absolute 91 15 - 500 cells/uL   Basophils Absolute 40 0 - 200 cells/uL   Neutrophils Relative % 54.5 %   Total Lymphocyte 35.1 %  Monocytes Relative 8.1 %   Eosinophils Relative 1.6 %   Basophils Relative 0.7 %  Hemoglobin A1c  Result Value Ref Range   Hgb A1c MFr Bld 5.6 <5.7 % of total Hgb   Mean Plasma Glucose 114 mg/dL   eAG (mmol/L) 6.3 mmol/L      Assessment & Plan:   Problem List Items Addressed This Visit     Hyperlipidemia    Previously controlled cholesterol on statin and lifestyle The 10-year ASCVD risk score (Arnett DK, et al., 2019) is: 30.9% Fam history MI CAD brother  Plan: 1. Continue current meds - Simvastatin 40mg  daily, Omega 3 2. Continue ASA 81mg  for primary ASCVD risk reduction 3. Encourage improved lifestyle - low carb/cholesterol, reduce portion size, continue improving regular exercise       Relevant Medications   hydrochlorothiazide (HYDRODIURIL) 25 MG tablet   Other Relevant Orders   COMPLETE METABOLIC PANEL WITH GFR   Lipid panel   Essential hypertension    Elevated BP readings Continue Lisinopril 40mg  Start HCTZ once daily, 25mg  tablets, you can START WITH HALF tablet for dose 12.5mg  daily, if ultimately not successful can take WHOLE PILL. Keep an eye on the BP goal < 140 / 90. 4-6 weeks return for BP re-check, can do lab after.      Relevant Medications   hydrochlorothiazide (HYDRODIURIL) 25 MG tablet   Other Relevant Orders   COMPLETE METABOLIC PANEL WITH GFR   CBC with Differential/Platelet   Elevated blood sugar   Relevant Orders   Hemoglobin A1c   Other Visit Diagnoses     Annual physical exam    -  Primary   Relevant Orders   COMPLETE METABOLIC PANEL WITH GFR   Lipid panel   CBC with Differential/Platelet   Hemoglobin A1c   PSA   Need for pneumococcal vaccination       Relevant Orders   Pneumococcal conjugate vaccine 20-valent (Completed)   Nocturia       Relevant Orders   PSA       Updated Health Maintenance information Fasting labs, pending results PCV20 final PNA vax today Encouraged improvement to lifestyle with diet and exercise Goal of weight loss    Meds ordered this encounter  Medications   hydrochlorothiazide (HYDRODIURIL) 25 MG tablet    Sig: Take 1 tablet (25 mg total) by mouth daily. Start with half tablet 12.5mg  daily, may increase to 25 if needed in future.    Dispense:  90 tablet    Refill:  3      Follow up plan: Return in about 4 weeks (around 07/21/2021) for 4 weeks HTN LAB AFTER + Flu Shot.  Nobie Putnam, [facility] 06/23/2021, 9:04 AM

## 2021-06-24 LAB — COMPLETE METABOLIC PANEL WITH GFR
AG Ratio: 1.4 (calc) (ref 1.0–2.5)
ALT: 19 U/L (ref 9–46)
AST: 22 U/L (ref 10–35)
Albumin: 4.3 g/dL (ref 3.6–5.1)
Alkaline phosphatase (APISO): 58 U/L (ref 35–144)
BUN/Creatinine Ratio: 18 (calc) (ref 6–22)
BUN: 24 mg/dL (ref 7–25)
CO2: 31 mmol/L (ref 20–32)
Calcium: 9.6 mg/dL (ref 8.6–10.3)
Chloride: 101 mmol/L (ref 98–110)
Creat: 1.37 mg/dL — ABNORMAL HIGH (ref 0.70–1.28)
Globulin: 3.1 g/dL (calc) (ref 1.9–3.7)
Glucose, Bld: 107 mg/dL — ABNORMAL HIGH (ref 65–99)
Potassium: 5.5 mmol/L — ABNORMAL HIGH (ref 3.5–5.3)
Sodium: 139 mmol/L (ref 135–146)
Total Bilirubin: 0.7 mg/dL (ref 0.2–1.2)
Total Protein: 7.4 g/dL (ref 6.1–8.1)
eGFR: 54 mL/min/{1.73_m2} — ABNORMAL LOW (ref >=60)

## 2021-06-24 LAB — HEMOGLOBIN A1C
Hgb A1c MFr Bld: 5.8 % of total Hgb — ABNORMAL HIGH (ref <5.7)
Mean Plasma Glucose: 120 mg/dL
eAG (mmol/L): 6.6 mmol/L

## 2021-06-24 LAB — CBC WITH DIFFERENTIAL/PLATELET
Absolute Monocytes: 516 cells/uL (ref 200–950)
Basophils Absolute: 27 cells/uL (ref 0–200)
Basophils Relative: 0.4 %
Eosinophils Absolute: 67 cells/uL (ref 15–500)
Eosinophils Relative: 1 %
HCT: 45.7 % (ref 38.5–50.0)
Hemoglobin: 15.2 g/dL (ref 13.2–17.1)
Lymphs Abs: 2111 cells/uL (ref 850–3900)
MCH: 32.3 pg (ref 27.0–33.0)
MCHC: 33.3 g/dL (ref 32.0–36.0)
MCV: 97.2 fL (ref 80.0–100.0)
MPV: 11.5 fL (ref 7.5–12.5)
Monocytes Relative: 7.7 %
Neutro Abs: 3980 cells/uL (ref 1500–7800)
Neutrophils Relative %: 59.4 %
Platelets: 225 10*3/uL (ref 140–400)
RBC: 4.7 10*6/uL (ref 4.20–5.80)
RDW: 12.3 % (ref 11.0–15.0)
Total Lymphocyte: 31.5 %
WBC: 6.7 10*3/uL (ref 3.8–10.8)

## 2021-06-24 LAB — LIPID PANEL
Cholesterol: 162 mg/dL (ref <200)
HDL: 55 mg/dL (ref >=40)
LDL Cholesterol (Calc): 87 mg/dL (calc)
Non-HDL Cholesterol (Calc): 107 mg/dL (calc) (ref <130)
Total CHOL/HDL Ratio: 2.9 (calc) (ref <5.0)
Triglycerides: 107 mg/dL (ref <150)

## 2021-06-24 LAB — PSA: PSA: 0.32 ng/mL (ref <=4.00)

## 2021-07-16 DIAGNOSIS — Z961 Presence of intraocular lens: Secondary | ICD-10-CM | POA: Diagnosis not present

## 2021-07-21 ENCOUNTER — Ambulatory Visit (INDEPENDENT_AMBULATORY_CARE_PROVIDER_SITE_OTHER): Payer: Medicare Other | Admitting: Family Medicine

## 2021-07-21 ENCOUNTER — Other Ambulatory Visit: Payer: Self-pay

## 2021-07-21 ENCOUNTER — Encounter: Payer: Self-pay | Admitting: Family Medicine

## 2021-07-21 VITALS — BP 128/78 | HR 78 | Ht 71.0 in | Wt 239.4 lb

## 2021-07-21 DIAGNOSIS — L720 Epidermal cyst: Secondary | ICD-10-CM

## 2021-07-21 DIAGNOSIS — I10 Essential (primary) hypertension: Secondary | ICD-10-CM

## 2021-07-21 DIAGNOSIS — Z23 Encounter for immunization: Secondary | ICD-10-CM | POA: Diagnosis not present

## 2021-07-21 NOTE — Progress Notes (Signed)
Subjective:    Patient ID: Jeff Ryan, male    DOB: 1945-07-27, 76 y.o.   MRN: [id number]  Jeff Ryan is a 76 y.o. male presenting on 07/21/2021 for Hypertension   HPI  CHRONIC HTN: Recently 4 weeks ago increased his HTN therapy added HCTZ, he started half pill 12.5 and then went up to 31m. Home BP  118-122 / 70-80. He feels good. Current Meds - Lisinopril 429m(PM), HCTZ 2524maily (AM) Reports good compliance, took meds today. Tolerating well, w/o complaints. Admits nocturia overnight Weight down 4-5 lbs in past 1 month Denies CP, dyspnea, HA, edema, dizziness / lightheadedness  [facility], [facility] provider preferred  Health Maintenance: Due for Flu Shot, will receive today    [facility] 07/21/2021 06/27/2020 04/10/2020  Decreased Interest 0 0 0  Down, Depressed, Hopeless 0 0 0  PHQ - 2 Score 0 0 0  Altered sleeping 0 - -  Tired, decreased energy 0 - -  Change in appetite 0 - -  Feeling bad or failure about yourself  0 - -  Trouble concentrating 0 - -  Moving slowly or fidgety/restless 0 - -  Suicidal thoughts 0 - -  PHQ-9 Score 0 - -  Difficult doing work/chores Not difficult at all - -    Social History   Tobacco Use   Smoking status: Former    Types: Cigarettes    Quit date: 01/11/2007    Years since quitting: 14.5   Smokeless tobacco: Former  VapScientific laboratory techniciane: Never used  Substance Use Topics   Alcohol use: Yes    Alcohol/week: 0.0 standard drinks    Comment: beer x2 weekly   Drug use: No    Review of Systems Per HPI unless specifically indicated above     Objective:    BP 128/78 (BP Location: Left Arm, Cuff Size: Normal)    Pulse 78    Ht _0  (1.803 m)    Wt 239 lb 6.4 oz (108.6 kg)    SpO2 100%    BMI 33.39 kg/m   Wt Readings from Last 3 Encounters:  07/21/21 239 lb 6.4 oz (108.6 kg)  06/23/21 245 lb (111.1 kg)  06/27/20 228 lb 9.6 oz (103.7 kg)    Physical Exam Vitals and  nursing note reviewed.  Constitutional:      General: He is not in acute distress.    Appearance: He is well-developed. He is not diaphoretic.     Comments: Well-appearing, comfortable, cooperative  HENT:     Head: Normocephalic and atraumatic.  Eyes:     General:        Right eye: No discharge.        Left eye: No discharge.     Conjunctiva/sclera: Conjunctivae normal.  Neck:     Thyroid: No thyromegaly.  Cardiovascular:     Rate and Rhythm: Normal rate and regular rhythm.     Pulses: Normal pulses.     Heart sounds: Normal heart sounds. No murmur heard. Pulmonary:     Effort: Pulmonary effort is normal. No respiratory distress.     Breath sounds: Normal breath sounds. No wheezing or rales.  Musculoskeletal:        General: Normal range of motion.     Cervical back: Normal range of motion and neck supple.  Lymphadenopathy:     Cervical: No cervical adenopathy.  Skin:    General: Skin is warm and  dry.     Findings: Lesion (1.5 smooth mobile non tender epidermoid cyst of posterior neck) present. No erythema or rash.  Neurological:     Mental Status: He is alert and oriented to person, place, and time. Mental status is at baseline.  Psychiatric:        Behavior: Behavior normal.     Comments: Well groomed, good eye contact, normal speech and thoughts     Results for orders placed or performed in visit on 06/23/21  COMPLETE METABOLIC PANEL WITH GFR  Result Value Ref Range   Glucose, Bld 107 (H) 65 - 99 mg/dL   BUN 24 7 - 25 mg/dL   Creat 1.37 (H) 0.70 - 1.28 mg/dL   eGFR 54 (L) > OR = 60 mL/min/1.61m   BUN/Creatinine Ratio 18 6 - 22 (calc)   Sodium 139 135 - 146 mmol/L   Potassium 5.5 (H) 3.5 - 5.3 mmol/L   Chloride 101 98 - 110 mmol/L   CO2 31 20 - 32 mmol/L   Calcium 9.6 8.6 - 10.3 mg/dL   Total Protein 7.4 6.1 - 8.1 g/dL   Albumin 4.3 3.6 - 5.1 g/dL   Globulin 3.1 1.9 - 3.7 g/dL (calc)   AG Ratio 1.4 1.0 - 2.5 (calc)   Total Bilirubin 0.7 0.2 - 1.2 mg/dL    Alkaline phosphatase (APISO) 58 35 - 144 U/L   AST 22 10 - 35 U/L   ALT 19 9 - 46 U/L  Lipid panel  Result Value Ref Range   Cholesterol 162 <200 mg/dL   HDL 55 > OR = 40 mg/dL   Triglycerides 107 <150 mg/dL   LDL Cholesterol (Calc) 87 mg/dL (calc)   Total CHOL/HDL Ratio 2.9 <5.0 (calc)   Non-HDL Cholesterol (Calc) 107 <130 mg/dL (calc)  CBC with Differential/Platelet  Result Value Ref Range   WBC 6.7 3.8 - 10.8 Thousand/uL   RBC 4.70 4.20 - 5.80 Million/uL   Hemoglobin 15.2 13.2 - 17.1 g/dL   HCT 45.7 38.5 - 50.0 %   MCV 97.2 80.0 - 100.0 fL   MCH 32.3 27.0 - 33.0 pg   MCHC 33.3 32.0 - 36.0 g/dL   RDW 12.3 11.0 - 15.0 %   Platelets 225 140 - 400 Thousand/uL   MPV 11.5 7.5 - 12.5 fL   Neutro Abs 3,980 1,500 - 7,800 cells/uL   Lymphs Abs 2,111 850 - 3,900 cells/uL   Absolute Monocytes 516 200 - 950 cells/uL   Eosinophils Absolute 67 15 - 500 cells/uL   Basophils Absolute 27 0 - 200 cells/uL   Neutrophils Relative % 59.4 %   Total Lymphocyte 31.5 %   Monocytes Relative 7.7 %   Eosinophils Relative 1.0 %   Basophils Relative 0.4 %  Hemoglobin A1c  Result Value Ref Range   Hgb A1c MFr Bld 5.8 (H) <5.7 % of total Hgb   Mean Plasma Glucose 120 mg/dL   eAG (mmol/L) 6.6 mmol/L  PSA  Result Value Ref Range   PSA 0.32 < OR = 4.00 ng/mL      Assessment & Plan:   Problem List Items Addressed This Visit     Essential hypertension - Primary    Mildly elevated initial BP, repeat manual check improved to normal. - Home BP readings normal range  No known complications    Plan:  1. Continue Lisinopril 456mdaily, HCTZ 2525maily 2. Encourage improved lifestyle - low sodium diet, regular exercise 3. Continue monitor BP outside office, bring  readings to next visit, if persistently >140/90 or new symptoms notify office sooner  Check BMET today for thiazide / follow Cr trend      Relevant Orders   BASIC METABOLIC PANEL WITH GFR   Other Visit Diagnoses     Epidermoid cyst of  neck       Relevant Orders   Ambulatory referral to Dermatology   Needs flu shot       Relevant Orders   Flu Vaccine QUAD High Dose(Fluad) (Completed)       No orders of the defined types were placed in this encounter.  Referral to [facility] Dr Evorn Gong for epidermoid cyst excision  Orders Placed This Encounter  Procedures   Flu Vaccine QUAD High Dose(Fluad)   BASIC METABOLIC PANEL WITH GFR   Ambulatory referral to Dermatology    Referral Priority:   Routine    Referral Type:   Consultation    Referral Reason:   Specialty Services Required    Requested Specialty:   Dermatology    Number of Visits Requested:   1      Follow up plan: Return in about 6 months (around 01/18/2022) for 6 month follow-up HTN.   Nobie Putnam, [facility] 07/21/2021, 8:30 AM

## 2021-07-21 NOTE — Assessment & Plan Note (Signed)
Mildly elevated initial BP, repeat manual check improved to normal. - Home BP readings normal range  No known complications    Plan:  1. Continue Lisinopril 40mg  daily, HCTZ 25mg  daily 2. Encourage improved lifestyle - low sodium diet, regular exercise 3. Continue monitor BP outside office, bring readings to next visit, if persistently >140/90 or new symptoms notify office sooner  Check BMET today for thiazide / follow Cr trend

## 2021-07-21 NOTE — Patient Instructions (Addendum)
Thank you for coming to the office today.  Keep on current BP medications.  Lab today to check on kidney  Flu Shot today.  [facility] [place] [employer], [place] [address] Phone: [phone]  Dr Kirkland Hun  If not heard back in 2 weeks contact their office or ours if need.  Please schedule a Follow-up Appointment to: Return in about 6 months (around 01/18/2022) for 6 month follow-up HTN.  If you have any other questions or concerns, please feel free to call the office or send a message through [place]. You may also schedule an earlier appointment if necessary.  Additionally, you may be receiving a survey about your experience at our office within a few days to 1 week by e-mail or mail. We value your feedback.  Nobie Putnam, DO [place]

## 2021-07-22 LAB — BASIC METABOLIC PANEL WITH GFR
BUN/Creatinine Ratio: 21 (calc) (ref 6–22)
BUN: 28 mg/dL — ABNORMAL HIGH (ref 7–25)
CO2: 30 mmol/L (ref 20–32)
Calcium: 9.7 mg/dL (ref 8.6–10.3)
Chloride: 99 mmol/L (ref 98–110)
Creat: 1.35 mg/dL — ABNORMAL HIGH (ref 0.70–1.28)
Glucose, Bld: 110 mg/dL — ABNORMAL HIGH (ref 65–99)
Potassium: 5.1 mmol/L (ref 3.5–5.3)
Sodium: 137 mmol/L (ref 135–146)
eGFR: 55 mL/min/{1.73_m2} — ABNORMAL LOW (ref >=60)

## 2021-07-30 ENCOUNTER — Other Ambulatory Visit: Payer: Self-pay | Admitting: Family Medicine

## 2021-07-30 DIAGNOSIS — E7849 Other hyperlipidemia: Secondary | ICD-10-CM

## 2021-07-30 DIAGNOSIS — I10 Essential (primary) hypertension: Secondary | ICD-10-CM

## 2021-07-31 NOTE — Telephone Encounter (Signed)
Requested Prescriptions  Pending Prescriptions Disp Refills   simvastatin (ZOCOR) 40 MG tablet [Pharmacy Med Name: Simvastatin 40 MG Oral Tablet] 90 tablet 3    Sig: TAKE 1 TABLET BY MOUTH  DAILY WITH SUPPER     Cardiovascular:  Antilipid - Statins Passed - 07/30/2021 11:58 PM      Passed - Total Cholesterol in normal range and within 360 days    Cholesterol, Total  Date Value Ref Range Status  04/15/2015 144 100 - 199 mg/dL Final   Cholesterol  Date Value Ref Range Status  06/23/2021 162 <200 mg/dL Final         Passed - LDL in normal range and within 360 days    LDL Cholesterol (Calc)  Date Value Ref Range Status  06/23/2021 87 mg/dL (calc) Final    Comment:    Reference range: <100 . Desirable range <100 mg/dL for primary prevention;   <70 mg/dL for patients with CHD or diabetic patients  with > or = 2 CHD risk factors. Marland Kitchen LDL-C is now calculated using the Martin-Hopkins  calculation, which is a validated novel method providing  better accuracy than the Friedewald equation in the  estimation of LDL-C.  Cresenciano Genre et al. Annamaria Helling. 2947;654(65): 2061-2068  (http://education.[facility].com/faq/FAQ164)          Passed - HDL in normal range and within 360 days    HDL  Date Value Ref Range Status  06/23/2021 55 > OR = 40 mg/dL Final  04/15/2015 63 >39 mg/dL Final    Comment:    According to ATP-III Guidelines, HDL-C >59 mg/dL is considered a negative risk factor for CHD.          Passed - Triglycerides in normal range and within 360 days    Triglycerides  Date Value Ref Range Status  06/23/2021 107 <150 mg/dL Final         Passed - Patient is not pregnant      Passed - Valid encounter within last 12 months    Recent Outpatient Visits          1 week ago Essential hypertension   [place], DO   1 month ago Annual physical exam   [facility] Parks Ranger, Devonne Doughty, DO   1 year ago Annual physical  exam   [facility] Olin Hauser, DO   1 year ago Essential hypertension   [place], Devonne Doughty, DO   2 years ago Annual physical exam   [place], DO      Future Appointments            In 5 months Parks Ranger, Devonne Doughty, [facility], [facility]            lisinopril (ZESTRIL) 40 MG tablet [Pharmacy Med Name: Lisinopril 40 MG Oral Tablet] 90 tablet 1    Sig: TAKE 1 TABLET BY MOUTH  DAILY     Cardiovascular:  ACE Inhibitors Failed - 07/30/2021 11:58 PM      Failed - Cr in normal range and within 180 days    Creat  Date Value Ref Range Status  07/21/2021 1.35 (H) 0.70 - 1.28 mg/dL Final         Passed - K in normal range and within 180 days    Potassium  Date Value Ref Range Status  07/21/2021 5.1 3.5 - 5.3 mmol/L Final  Passed - Patient is not pregnant      Passed - Last BP in normal range    BP Readings from Last 1 Encounters:  07/21/21 128/78         Passed - Valid encounter within last 6 months    Recent Outpatient Visits          1 week ago Essential hypertension   [place], DO   1 month ago Annual physical exam   [facility] Olin Hauser, DO   1 year ago Annual physical exam   [facility] Olin Hauser, DO   1 year ago Essential hypertension   [facility] Olin Hauser, DO   2 years ago Annual physical exam   [place], DO      Future Appointments            In 5 months Parks Ranger, Devonne Doughty, [facility], [facility]

## 2021-08-14 ENCOUNTER — Telehealth: Payer: Self-pay

## 2021-08-14 DIAGNOSIS — L728 Other follicular cysts of the skin and subcutaneous tissue: Secondary | ICD-10-CM | POA: Diagnosis not present

## 2021-08-14 DIAGNOSIS — L570 Actinic keratosis: Secondary | ICD-10-CM | POA: Diagnosis not present

## 2021-08-14 DIAGNOSIS — X32XXXA Exposure to sunlight, initial encounter: Secondary | ICD-10-CM | POA: Diagnosis not present

## 2021-08-14 NOTE — Telephone Encounter (Signed)
I spoke with the patient to attempt to schedule for his medicare wellness. The pt declined the appt at this time.

## 2021-08-14 NOTE — Telephone Encounter (Signed)
I attempted to contact the patient. Left a message with the patient wife to have him to give [place] a call back at his convenience.

## 2021-08-28 DIAGNOSIS — Z789 Other specified health status: Secondary | ICD-10-CM | POA: Diagnosis not present

## 2021-08-28 DIAGNOSIS — L538 Other specified erythematous conditions: Secondary | ICD-10-CM | POA: Diagnosis not present

## 2021-08-28 DIAGNOSIS — D37030 Neoplasm of uncertain behavior of the parotid salivary glands: Secondary | ICD-10-CM | POA: Diagnosis not present

## 2021-08-28 DIAGNOSIS — L720 Epidermal cyst: Secondary | ICD-10-CM | POA: Diagnosis not present

## 2021-08-28 DIAGNOSIS — R208 Other disturbances of skin sensation: Secondary | ICD-10-CM | POA: Diagnosis not present

## 2021-08-30 ENCOUNTER — Encounter: Payer: Self-pay | Admitting: Family Medicine

## 2021-09-01 ENCOUNTER — Other Ambulatory Visit: Payer: Self-pay | Admitting: Otolaryngology

## 2021-09-01 DIAGNOSIS — K118 Other diseases of salivary glands: Secondary | ICD-10-CM

## 2021-09-11 ENCOUNTER — Ambulatory Visit
Admission: RE | Admit: 2021-09-11 | Discharge: 2021-09-11 | Disposition: A | Discharge status: Home / Self Care | Payer: Medicare Other | Source: Ambulatory Visit | Attending: Otolaryngology | Admitting: Otolaryngology

## 2021-09-11 DIAGNOSIS — J439 Emphysema, unspecified: Secondary | ICD-10-CM | POA: Diagnosis not present

## 2021-09-11 DIAGNOSIS — M47812 Spondylosis without myelopathy or radiculopathy, cervical region: Secondary | ICD-10-CM | POA: Diagnosis not present

## 2021-09-11 DIAGNOSIS — K118 Other diseases of salivary glands: Secondary | ICD-10-CM | POA: Diagnosis not present

## 2021-09-11 DIAGNOSIS — L989 Disorder of the skin and subcutaneous tissue, unspecified: Secondary | ICD-10-CM | POA: Diagnosis not present

## 2021-09-11 MED ORDER — IOPAMIDOL (ISOVUE-300) INJECTION 61%
75.0000 mL | Freq: Once | INTRAVENOUS | Status: AC | PRN
  Administered 2021-09-11: 75 mL via INTRAVENOUS

## 2021-09-22 ENCOUNTER — Other Ambulatory Visit: Payer: Self-pay | Admitting: Otolaryngology

## 2021-09-22 DIAGNOSIS — K118 Other diseases of salivary glands: Secondary | ICD-10-CM

## 2021-09-29 ENCOUNTER — Ambulatory Visit
Admission: RE | Admit: 2021-09-29 | Discharge: 2021-09-29 | Disposition: A | Discharge status: Home / Self Care | Payer: Medicare Other | Source: Ambulatory Visit | Attending: Otolaryngology | Admitting: Otolaryngology

## 2021-09-29 ENCOUNTER — Ambulatory Visit: Payer: Medicare Other

## 2021-09-29 DIAGNOSIS — K118 Other diseases of salivary glands: Secondary | ICD-10-CM | POA: Diagnosis not present

## 2021-09-29 MED ORDER — MIDAZOLAM HCL 2 MG/2ML IJ SOLN
INTRAMUSCULAR | Status: AC
  Filled 2021-09-29: qty 2

## 2021-09-29 MED ORDER — MIDAZOLAM HCL 2 MG/2ML IJ SOLN
INTRAMUSCULAR | Status: DC | PRN
  Administered 2021-09-29: 1 mg via INTRAVENOUS

## 2021-09-29 MED ORDER — FENTANYL CITRATE (PF) 100 MCG/2ML IJ SOLN
INTRAMUSCULAR | Status: AC
  Filled 2021-09-29: qty 2

## 2021-09-29 MED ORDER — SODIUM CHLORIDE 0.9 % IV SOLN: INTRAVENOUS | Status: DC

## 2021-09-29 MED ORDER — FENTANYL CITRATE (PF) 100 MCG/2ML IJ SOLN
INTRAMUSCULAR | Status: DC | PRN
  Administered 2021-09-29: 50 ug via INTRAVENOUS

## 2021-09-29 MED ORDER — MIDAZOLAM HCL 2 MG/ML PO SYRP
ORAL_SOLUTION | ORAL | Status: AC
  Filled 2021-09-29: qty 4

## 2021-09-29 NOTE — H&P (Signed)
? ?Chief Complaint: ?Patient was seen in consultation today for right parotid mass at the request of Bennett,Paul ? ?Referring Physician(s): ?Bennett,Paul ? ?Supervising Physician: Aletta Edouard ? ?Patient Status: [place] ? ?History of Present Illness: ?Jeff Ryan is a 76 y.o. male with recent CT imaging and found to have a right parotid mass who is here today for scheduled elective core biopsy. The patient denies any current chest pain or shortness of breath. He denies any current blood thinner use. He has no known complications to sedation.  ? ? ?Past Medical History:  ?Diagnosis Date  ? Dyslipidemia   ? Essential hypertension   ? ? ?Past Surgical History:  ?Procedure Laterality Date  ? CATARACT EXTRACTION, BILATERAL    ? left 10/26/2014 right 10/12/2014  ? TONSILLECTOMY AND ADENOIDECTOMY    ? US ECHOCARDIOGRAPHY    ? 10/2014  ? VASECTOMY    ? ? ?Allergies: ?Patient has no known allergies. ? ?Medications: ?Prior to Admission medications   ?Medication Sig Start Date End Date Taking? Authorizing Provider  ?aspirin EC 81 MG tablet Take 1 tablet (81 mg total) by mouth daily. 03/02/17  Yes Karamalegos, Devonne Doughty, DO  ?hydrochlorothiazide (HYDRODIURIL) 25 MG tablet Take 1 tablet (25 mg total) by mouth daily. Start with half tablet 12.'5mg'$  daily, may increase to 25 if needed in future. 06/23/21  Yes Karamalegos, Devonne Doughty, DO  ?lisinopril (ZESTRIL) 40 MG tablet TAKE 1 TABLET BY MOUTH  DAILY 07/31/21  Yes Olin Hauser, DO  ?Omega 3 1000 MG CAPS Take 1 capsule (1,000 mg total) by mouth 3 (three) times daily. 03/02/17  Yes Karamalegos, Devonne Doughty, DO  ?OVER THE COUNTER MEDICATION Take 2 tablets by mouth 2 (two) times daily with a meal. Kidney Cop (Calcium Oxalate Protector)   Yes [provider]  ?simvastatin (ZOCOR) 40 MG tablet TAKE 1 TABLET BY MOUTH  DAILY WITH SUPPER 07/31/21  Yes Olin Hauser, DO  ?  ? ?Family History  ?Problem Relation Age of Onset  ? Heart disease  Brother   ? Heart attack Brother   ?     x 3  ? Prostate cancer Neg Hx   ? Colon cancer Neg Hx   ? ? ?Social History  ? ?Socioeconomic History  ? Marital status: Married  ?  Spouse name: Not on file  ? Number of children: Not on file  ? Years of education: Not on file  ? Highest education level: Not on file  ?Occupational History  ? Occupation: Retired Multimedia programmer)  ?  Comment: Company in [place], traveled around [place], retired age 8  ?Tobacco Use  ? Smoking status: Former  ?  Types: Cigarettes  ?  Quit date: 01/11/2007  ?  Years since quitting: 14.7  ? Smokeless tobacco: Former  ?Vaping Use  ? Vaping Use: Never used  ?Substance and Sexual Activity  ? Alcohol use: Yes  ?  Alcohol/week: 0.0 standard drinks  ?  Comment: beer x2 weekly  ? Drug use: No  ? Sexual activity: Not on file  ?Other Topics Concern  ? Not on file  ?Social History Narrative  ? Not on file  ? ?Social Determinants of Health  ? ?Financial Resource Strain: Not on file  ?Food Insecurity: Not on file  ?Transportation Needs: Not on file  ?Physical Activity: Not on file  ?Stress: Not on file  ?Social Connections: Not on file  ? ? ?Review of Systems: A 12 point ROS discussed and pertinent positives are indicated  in the HPI above.  All other systems are negative. ? ?Review of Systems ? ?Vital Signs: ?BP (!) 146/97   Pulse 88   Temp 98.4 ?F (36.9 ?C) (Oral)   Resp 20   Ht '5\' 11"'$  (1.803 m)   Wt 234 lb (106.1 kg)   SpO2 99%   BMI 32.64 kg/m?  ? ?Physical Exam ?Constitutional:   ?   Appearance: Normal appearance.  ?HENT:  ?   Head: Normocephalic and atraumatic.  ?Cardiovascular:  ?   Rate and Rhythm: Normal rate and regular rhythm.  ?Pulmonary:  ?   Effort: Pulmonary effort is normal. No respiratory distress.  ?Neurological:  ?   Mental Status: He is alert and oriented to person, place, and time.  ? ? ?Imaging: ?CT SOFT TISSUE NECK W CONTRAST ? ?Result Date: 09/11/2021 ?CLINICAL DATA:  Right parotid mass EXAM: CT NECK WITH CONTRAST TECHNIQUE:  Multidetector CT imaging of the neck was performed using the standard protocol following the bolus administration of intravenous contrast. RADIATION DOSE REDUCTION: This exam was performed according to the departmental dose-optimization program which includes automated exposure control, adjustment of the mA and/or kV according to patient size and/or use of iterative reconstruction technique. CONTRAST:  51m ISOVUE-300 IOPAMIDOL (ISOVUE-300) INJECTION 61% COMPARISON:  None. FINDINGS: Pharynx and larynx: Unremarkable.  No mass or swelling. Salivary glands: There is a 1.7 x 1.2 x 1.3 cm low-density lesion of the superficial right parotid. Few small bilateral parotid stones. Submandibular glands are unremarkable. Thyroid: Normal. Lymph nodes: No enlarged or abnormal density nodes. Vascular: Major neck vessels are patent. Minor calcified plaque at the ICA origins. Limited intracranial: No abnormal enhancement. Visualized orbits: Not included. Mastoids and visualized paranasal sinuses: No significant opacification. Skeleton: Degenerative changes of the cervical spine primarily at C6-C7. Upper chest: Emphysema. Other: Skin marker overlies right parotid lesion. IMPRESSION: 1.7 cm low-density superficial right parotid lesion. Differential considerations include sialocele, Warthin tumor, and necrotic node if there is history of skin cancer. Electronically Signed   By: PMacy MisM.D.   On: 09/11/2021 14:50   ? ?Labs: ? ?CBC: ?Recent Labs  ?  06/23/21 ?08850 ?WBC 6.7  ?HGB 15.2  ?HCT 45.7  ?PLT 225  ? ? ?COAGS: ?No results for input(s): INR, APTT in the last 8760 hours. ? ?BMP: ?Recent Labs  ?  06/23/21 ?0277401/09/23 ?01287 ?NA 139 137  ?K 5.5* 5.1  ?CL 101 99  ?CO2 31 30  ?GLUCOSE 107* 110*  ?BUN 24 28*  ?CALCIUM 9.6 9.7  ?CREATININE 1.37* 1.35*  ? ? ?LIVER FUNCTION TESTS: ?Recent Labs  ?  06/23/21 ?08676 ?BILITOT 0.7  ?AST 22  ?ALT 19  ?PROT 7.4  ? ?Assessment and Plan: ?This is a 76year old male with recent CT imaging  and found to have a right parotid mass who is here today for scheduled elective core biopsy.  ? ?The patient has been NPO, no blood thinners taken, labs and vitals have been reviewed. ? ?Risks and benefits of [place] right parotid mass biopsy with moderate sedation was discussed with the patient and/or patient's family including, but not limited to bleeding, infection, damage to adjacent structures or low yield requiring additional tests. ? ?All of the questions were answered and there is agreement to proceed. ? ?Consent signed and in chart. ? ?Thank you for this interesting consult.  I greatly enjoyed meeting Jeff Krenzand look forward to participating in their care.  A copy of this report was  sent to the requesting provider on this date. ? ?Electronically Signed: ?Tsosie Billing D, PA-C ?09/29/2021, 12:54 PM ? ? ?I spent a total of 15 Minutes in face to face in clinical consultation, greater than 50% of which was counseling/coordinating care for right parotid mass. ? ? ?

## 2021-09-29 NOTE — Progress Notes (Signed)
Patient clinically stable post RIght parotid cyst aspiration, tolerated well. Received Versed 1 mg along with Fentanyl 50 mcg IV for procedure. Awake/alert and oriented post procedure. Wife to bedside with update given. Discharge instructions given. ?

## 2021-09-29 NOTE — Procedures (Signed)
Interventional Radiology Procedure Note ? ?Procedure: US Guided aspiration of right parotid gland cyst ? ?Complications: None ? ?Estimated Blood Loss: None ? ?Findings: ?US shows anechoic cyst of right parotid gland yielding 1.5 mL of clear fluid via aspiration by 18 G spinal needle. Cyst completely decompressed by [place]. Fluid sent for cytologic analysis. ? ? ?Venetia Night. Kathlene Cote, M.D ?Pager:  [phone] ? ?  ?

## 2021-10-01 LAB — CYTOLOGY - NON PAP

## 2021-12-15 DIAGNOSIS — D37030 Neoplasm of uncertain behavior of the parotid salivary glands: Secondary | ICD-10-CM | POA: Diagnosis not present

## 2021-12-15 DIAGNOSIS — H6123 Impacted cerumen, bilateral: Secondary | ICD-10-CM | POA: Diagnosis not present

## 2021-12-31 ENCOUNTER — Encounter: Payer: Self-pay | Admitting: Family Medicine

## 2022-01-06 ENCOUNTER — Encounter: Payer: Self-pay | Admitting: Family Medicine

## 2022-01-13 ENCOUNTER — Other Ambulatory Visit: Payer: Self-pay | Admitting: Family Medicine

## 2022-01-13 DIAGNOSIS — I10 Essential (primary) hypertension: Secondary | ICD-10-CM

## 2022-01-14 NOTE — Telephone Encounter (Signed)
Requested medication (s) are due for refill today: yes  Requested medication (s) are on the active medication list: yes  Last refill:  07/31/21 #90 1 refills  Future visit scheduled: yes in 5 days   Notes to clinic:  protocol failed. Last labs 07/21/21 BP 141/90 on 09/29/21. Requesting 1 year supply . Do you want to refill for 1 year?     Requested Prescriptions  Pending Prescriptions Disp Refills   lisinopril (ZESTRIL) 40 MG tablet [Pharmacy Med Name: Lisinopril 40 MG Oral Tablet] 90 tablet 3    Sig: TAKE 1 TABLET BY MOUTH  DAILY     Cardiovascular:  ACE Inhibitors Failed - 01/13/2022 11:16 PM      Failed - Cr in normal range and within 180 days    Creat  Date Value Ref Range Status  07/21/2021 1.35 (H) 0.70 - 1.28 mg/dL Final         Failed - Last BP in normal range    BP Readings from Last 1 Encounters:  09/29/21 (!) 141/90         Passed - K in normal range and within 180 days    Potassium  Date Value Ref Range Status  07/21/2021 5.1 3.5 - 5.3 mmol/L Final         Passed - Patient is not pregnant      Passed - Valid encounter within last 6 months    Recent Outpatient Visits           5 months ago Essential hypertension   [facility] Olin Hauser, DO   6 months ago Annual physical exam   [facility] Olin Hauser, DO   1 year ago Annual physical exam   [facility] Olin Hauser, DO   1 year ago Essential hypertension   [place], Devonne Doughty, DO   2 years ago Annual physical exam   [place], Devonne Doughty, DO       Future Appointments             In 5 days Parks Ranger, Devonne Doughty, [facility], [facility]

## 2022-01-19 ENCOUNTER — Other Ambulatory Visit: Payer: Self-pay | Admitting: Family Medicine

## 2022-01-19 ENCOUNTER — Encounter: Payer: Self-pay | Admitting: Family Medicine

## 2022-01-19 ENCOUNTER — Ambulatory Visit (INDEPENDENT_AMBULATORY_CARE_PROVIDER_SITE_OTHER): Payer: Medicare Other | Admitting: Family Medicine

## 2022-01-19 VITALS — BP 118/74 | HR 77 | Ht 71.0 in | Wt 227.8 lb

## 2022-01-19 DIAGNOSIS — I10 Essential (primary) hypertension: Secondary | ICD-10-CM

## 2022-01-19 DIAGNOSIS — R739 Hyperglycemia, unspecified: Secondary | ICD-10-CM

## 2022-01-19 DIAGNOSIS — E782 Mixed hyperlipidemia: Secondary | ICD-10-CM | POA: Diagnosis not present

## 2022-01-19 DIAGNOSIS — E669 Obesity, unspecified: Secondary | ICD-10-CM | POA: Diagnosis not present

## 2022-01-19 DIAGNOSIS — E66811 Obesity, class 1: Secondary | ICD-10-CM

## 2022-01-19 DIAGNOSIS — Z0000 Encounter for general adult medical examination without abnormal findings: Secondary | ICD-10-CM

## 2022-01-19 DIAGNOSIS — R351 Nocturia: Secondary | ICD-10-CM

## 2022-01-19 NOTE — Progress Notes (Signed)
Subjective:    Patient ID: Jeff Ryan, male    DOB: September 12, 1945, 76 y.o.   MRN: [id number]  Jeff Ryan is a 76 y.o. male presenting on 01/19/2022 for Hypertension   HPI  CHRONIC HTN: Monitoring Home BP  118-122 / 70-80. He feels good. Current Meds - Lisinopril '40mg'$  (PM), HCTZ '25mg'$  daily (AM) Reports good compliance, took meds today. Tolerating well, w/o complaints. Admits nocturia overnight Denies CP, dyspnea, HA, edema, dizziness / lightheadedness  Obesity BMI >31 A1c 5.4 to 5.8 range. CBGs: Not checking. Meds: Never on meds Currently on ACEi Lifestyle: - Diet (Continues healthy diet - limits sugars and carbs, salads)  - Exercise (He continues good exercise regimen with 2-3 miles walking 7 days a week, treadmill for 1 hour, and yardwork here he has a garden and works on this regularly) - Weight down overall 7+ lbs Denies hypoglycemia, polyuria, visual changes, numbness or tingling.    HYPERLIPIDEMIA: - Reports no concerns. Last lipid 06/2021, well controlled - Currently taking Simvastatin '40mg'$ , tolerating well without side effects or myalgias - Taking Omega 3 fish oil '1000mg'$  x 3 daily, for many years, also thinks this helps his joints   Former smoker Quit 2008 History of flare up with bronchitis episodes in past, had one bad episode with hemoptysis, had a bronchoscopy in hospital, did not identify any malignancy or other problem. He had a good report from bronchoscopy. He had a CTA Angiogram that identified a burst blood vessel. He has remained smoke free since that time. - No history of COPD diagnosed   History of kidney stones - now on Kidney COP (Calcium Oxalate Protector) supplement OTC with minimal problems in months. One minor episode in 1 year.  Followed by [facility] ENT Dr Richardson Landry - had a parotid cyst biopsy, prior imaging and it was thought to be a mass but then drainage and reassurance that it was not a mass.  Health Maintenance:  Cologuard 04/2020  negative. Due next 3 years, 04/2023     07/21/2021    8:21 AM 06/27/2020    1:30 PM 04/10/2020   11:07 AM  Depression screen PHQ 2/9  Decreased Interest 0 0 0  Down, Depressed, Hopeless 0 0 0  PHQ - 2 Score 0 0 0  Altered sleeping 0    Tired, decreased energy 0    Change in appetite 0    Feeling bad or failure about yourself  0    Trouble concentrating 0    Moving slowly or fidgety/restless 0    Suicidal thoughts 0    PHQ-9 Score 0    Difficult doing work/chores Not difficult at all      Social History   Tobacco Use   Smoking status: Former    Types: Cigarettes    Quit date: 01/11/2007    Years since quitting: 15.0   Smokeless tobacco: Former  Scientific laboratory technician Use: Never used  Substance Use Topics   Alcohol use: Yes    Alcohol/week: 0.0 standard drinks of alcohol    Comment: beer x2 weekly   Drug use: No    Review of Systems Per HPI unless specifically indicated above     Objective:    BP 118/74 (BP Location: Left Arm, Cuff Size: Normal)   Pulse 77   Ht '5\' 11"'$  (1.803 m)   Wt 227 lb 12.8 oz (103.3 kg)   SpO2 100%   BMI 31.77 kg/m   Wt Readings from Last 3 Encounters:  01/19/22  227 lb 12.8 oz (103.3 kg)  09/29/21 234 lb (106.1 kg)  07/21/21 239 lb 6.4 oz (108.6 kg)    Physical Exam Vitals and nursing note reviewed.  Constitutional:      General: He is not in acute distress.    Appearance: He is well-developed. He is not diaphoretic.     Comments: Well-appearing, comfortable, cooperative  HENT:     Head: Normocephalic and atraumatic.  Eyes:     General:        Right eye: No discharge.        Left eye: No discharge.     Conjunctiva/sclera: Conjunctivae normal.  Neck:     Thyroid: No thyromegaly.  Cardiovascular:     Rate and Rhythm: Normal rate and regular rhythm.     Pulses: Normal pulses.     Heart sounds: Normal heart sounds. No murmur heard. Pulmonary:     Effort: Pulmonary effort is normal. No respiratory distress.     Breath sounds: Normal  breath sounds. No wheezing or rales.  Musculoskeletal:        General: Normal range of motion.     Cervical back: Normal range of motion and neck supple.     Right lower leg: No edema.     Left lower leg: No edema.  Lymphadenopathy:     Cervical: No cervical adenopathy.  Skin:    General: Skin is warm and dry.     Findings: No erythema or rash.  Neurological:     Mental Status: He is alert and oriented to person, place, and time. Mental status is at baseline.  Psychiatric:        Behavior: Behavior normal.     Comments: Well groomed, good eye contact, normal speech and thoughts       Results for orders placed or performed during the hospital encounter of 09/29/21  Cytology - Non PAP; Right parotid gland cyst, 1.5 cm  Result Value Ref Range   CYTOLOGY - NON GYN      CYTOLOGY - NON PAP CASE: [id number] PATIENT: Jeff Ryan Non-Gynecological Cytology Report     Specimen Submitted: A. Cyst, right parotid gland  Clinical History: Palpable right parotid gland lesion.  Cyst of right parotid gland yielding 1.5 mL of clear fluid    DIAGNOSIS: A. PAROTID GLAND, RIGHT; ULTRASOUND-GUIDED FINE-NEEDLE ASPIRATION: - NONDIAGNOSTIC (MILAN CATEGORY I).  Comment: Smears display a relatively paucicellular sampling, comprised predominantly of blood, mixed inflammatory cells, and few macrophages. Only rare potential degenerated epithelial elements are present, and definite mucinous epithelium is not identified. The findings may represent sampling of cyst contents.  Clinical and radiographic correlation is recommended.  Slides reviewed: 1 ThinPrep, 2 cytospin  GROSS DESCRIPTION: A. Labeled: Right parotid cyst Received: Fresh Collection time: 2 PM on 09/29/2021 Placed into formalin time: Not applicable Volume: Approximately 1.2  mL Description of fluid and container in which it is received: Received in a 10 mL clear plastic syringe with a red stopper is colorless  slightly cloudy fluid. Cytospin slide(s) received: 2  Specimen material submitted for: ThinPrep  RB 09/30/2021  Final Diagnosis performed by Allena Napoleon, MD.   Electronically signed 10/01/2021 3:52:17PM The electronic signature indicates that the named Attending Pathologist has evaluated the specimen Technical component performed at [facility], [address], [place], [place] [address] Lab: [phone] Dir: Rush Farmer, MD, MMM  Professional component performed at [facility], [facility], [place], [place],  [address] Lab: [id number] Dir: Kathi Simpers, MD  Assessment & Plan:   Problem List Items Addressed This Visit     Obesity (BMI 30.0-34.9)    Weight down 7 lbs in 4 months Encouraged continue healthy lifestyle w/ goals to maintain wt      Hyperlipidemia    Previously controlled cholesterol on statin and lifestyle The 10-year ASCVD risk score (Arnett DK, et al., 2019) is: 33% Fam history MI CAD brother  Plan: 1. Continue current meds - Simvastatin '40mg'$  daily, Omega 3 2. Continue ASA '81mg'$  for primary ASCVD risk reduction 3. Encourage improved lifestyle - low carb/cholesterol, reduce portion size, continue improving regular exercise      Essential hypertension - Primary    Mildly elevated initial BP, repeat manual check improved to normal. - Home BP readings normal range  No known complications    Plan:  1. Continue Lisinopril '40mg'$  daily, HCTZ '25mg'$  daily 2. Encourage improved lifestyle - low sodium diet, regular exercise 3. Continue monitor BP outside office, bring readings to next visit, if persistently >140/90 or new symptoms notify office sooner       No orders of the defined types were placed in this encounter.    Follow up plan: Return in about 6 months (around 07/22/2022) for 6 month fasting lab only then 1 week later Annual Physical.  Future labs ordered for 07/20/22   Nobie Putnam, [facility] 01/19/2022, 8:25 AM

## 2022-01-19 NOTE — Patient Instructions (Addendum)
Thank you for coming to the office today.  Keep up the good work!  BP is controlled.  Weight is improved.  No changes, meds have refills.  DUE for FASTING BLOOD WORK (no food or drink after midnight before the lab appointment, only water or coffee without cream/sugar on the morning of)  SCHEDULE "Lab Only" visit in the morning at the clinic for lab draw in 6 MONTHS   - Make sure Lab Only appointment is at about 1 week before your next appointment, so that results will be available  For Lab Results, once available within 2-3 days of blood draw, you can can log in to MyChart online to view your results and a brief explanation. Also, we can discuss results at next follow-up visit.   Please schedule a Follow-up Appointment to: Return in about 6 months (around 07/22/2022) for 6 month fasting lab only then 1 week later Annual Physical.  If you have any other questions or concerns, please feel free to call the office or send a message through [place]. You may also schedule an earlier appointment if necessary.  Additionally, you may be receiving a survey about your experience at our office within a few days to 1 week by e-mail or mail. We value your feedback.  Nobie Putnam, DO [place]

## 2022-01-19 NOTE — Assessment & Plan Note (Signed)
Mildly elevated initial BP, repeat manual check improved to normal. - Home BP readings normal range  No known complications    Plan:  1. Continue Lisinopril '40mg'$  daily, HCTZ '25mg'$  daily 2. Encourage improved lifestyle - low sodium diet, regular exercise 3. Continue monitor BP outside office, bring readings to next visit, if persistently >140/90 or new symptoms notify office sooner

## 2022-01-19 NOTE — Assessment & Plan Note (Addendum)
Weight down 7 lbs in 4 months Encouraged continue healthy lifestyle w/ goals to maintain wt

## 2022-01-19 NOTE — Assessment & Plan Note (Signed)
Previously controlled cholesterol on statin and lifestyle The 10-year ASCVD risk score (Arnett DK, et al., 2019) is: 33% Fam history MI CAD brother  Plan: 1. Continue current meds - Simvastatin '40mg'$  daily, Omega 3 2. Continue ASA '81mg'$  for primary ASCVD risk reduction 3. Encourage improved lifestyle - low carb/cholesterol, reduce portion size, continue improving regular exercise

## 2022-01-23 ENCOUNTER — Other Ambulatory Visit: Payer: Self-pay | Admitting: Family Medicine

## 2022-01-23 DIAGNOSIS — I10 Essential (primary) hypertension: Secondary | ICD-10-CM

## 2022-01-26 ENCOUNTER — Other Ambulatory Visit: Payer: Self-pay | Admitting: Family Medicine

## 2022-01-26 DIAGNOSIS — E7849 Other hyperlipidemia: Secondary | ICD-10-CM

## 2022-01-26 NOTE — Telephone Encounter (Signed)
Medication Refill - Medication: simvastatin (ZOCOR) 40 MG tablet  Wants 3 refills to last him   Has the patient contacted their pharmacy? Yes.   (Agent: If no, request that the patient contact the pharmacy for the refill. If patient does not wish to contact the pharmacy document the reason why and proceed with request.) (Agent: If yes, when and what did the pharmacy advise?)  Preferred Pharmacy (with phone number or street name):  [facility] [place], [place] [facility]  [address] [place] [address]  Phone: [phone] Fax: [phone]   Has the patient been seen for an appointment in the last year OR does the patient have an upcoming appointment? Yes.    Agent: Please be advised that RX refills may take up to 3 business days. We ask that you follow-up with your pharmacy.

## 2022-01-26 NOTE — Telephone Encounter (Signed)
Requested Prescriptions  Pending Prescriptions Disp Refills  . hydrochlorothiazide (HYDRODIURIL) 25 MG tablet [Pharmacy Med Name: hydroCHLOROthiazide 25 MG Oral Tablet] 100 tablet 2    Sig: TAKE ONE-HALF TABLET BY MOUTH  DAILY AS DIRECTED BASED ON BLOOD PRESSURE RESPONSE, MAY INCREASE  UP TO 1 TABLET DAILY IF NEEDED  IN THE FUTURE, AS DIRECTED     Cardiovascular: Diuretics - Thiazide Failed - 01/23/2022 10:52 PM      Failed - Cr in normal range and within 180 days    Creat  Date Value Ref Range Status  07/21/2021 1.35 (H) 0.70 - 1.28 mg/dL Final         Failed - K in normal range and within 180 days    Potassium  Date Value Ref Range Status  07/21/2021 5.1 3.5 - 5.3 mmol/L Final         Failed - Na in normal range and within 180 days    Sodium  Date Value Ref Range Status  07/21/2021 137 135 - 146 mmol/L Final  08/20/2016 140 134 - 144 mmol/L Final         Passed - Last BP in normal range    BP Readings from Last 1 Encounters:  01/19/22 118/74         Passed - Valid encounter within last 6 months    Recent Outpatient Visits          1 week ago Essential hypertension   [facility] Olin Hauser, DO   6 months ago Essential hypertension   [place], DO   7 months ago Annual physical exam   [facility] Olin Hauser, DO   1 year ago Annual physical exam   [facility] Olin Hauser, DO   1 year ago Essential hypertension   [place], DO      Future Appointments            In 6 months Parks Ranger, Devonne Doughty, DO [facility], [facility]

## 2022-01-27 NOTE — Telephone Encounter (Signed)
Refilled 07/31/2021 #90 3 refills - 1 year supply. Requested Prescriptions  Pending Prescriptions Disp Refills  . simvastatin (ZOCOR) 40 MG tablet 90 tablet 3    Sig: Take 1 tablet (40 mg total) by mouth daily with supper.     Cardiovascular:  Antilipid - Statins Failed - 01/26/2022  2:33 PM      Failed - Lipid Panel in normal range within the last 12 months    Cholesterol, Total  Date Value Ref Range Status  04/15/2015 144 100 - 199 mg/dL Final   Cholesterol  Date Value Ref Range Status  06/23/2021 162 <200 mg/dL Final   LDL Cholesterol (Calc)  Date Value Ref Range Status  06/23/2021 87 mg/dL (calc) Final    Comment:    Reference range: <100 . Desirable range <100 mg/dL for primary prevention;   <70 mg/dL for patients with CHD or diabetic patients  with > or = 2 CHD risk factors. Marland Kitchen LDL-C is now calculated using the Martin-Hopkins  calculation, which is a validated novel method providing  better accuracy than the Friedewald equation in the  estimation of LDL-C.  Cresenciano Genre et al. Annamaria Helling. 6010;932(35): 2061-2068  (http://education.[facility].com/faq/FAQ164)    HDL  Date Value Ref Range Status  06/23/2021 55 > OR = 40 mg/dL Final  04/15/2015 63 >39 mg/dL Final    Comment:    According to ATP-III Guidelines, HDL-C >59 mg/dL is considered a negative risk factor for CHD.    Triglycerides  Date Value Ref Range Status  06/23/2021 107 <150 mg/dL Final         Passed - Patient is not pregnant      Passed - Valid encounter within last 12 months    Recent Outpatient Visits          1 week ago Essential hypertension   [place], Devonne Doughty, DO   6 months ago Essential hypertension   [place], DO   7 months ago Annual physical exam   [facility] Olin Hauser, DO   1 year ago Annual physical exam   [facility] Olin Hauser, DO   1 year  ago Essential hypertension   [place], DO      Future Appointments            In 6 months Parks Ranger, Devonne Doughty, [facility], [facility]

## 2022-03-28 ENCOUNTER — Telehealth: Payer: Self-pay | Admitting: Family Medicine

## 2022-03-28 DIAGNOSIS — I10 Essential (primary) hypertension: Secondary | ICD-10-CM

## 2022-03-30 NOTE — Telephone Encounter (Signed)
Requested Prescriptions  Pending Prescriptions Disp Refills  . hydrochlorothiazide (HYDRODIURIL) 25 MG tablet [Pharmacy Med Name: hydroCHLOROthiazide 25 MG Oral Tablet] 80 tablet 3    Sig: TAKE ONE-HALF TABLET BY MOUTH  DAILY AS DIRECTED BASED ON BLOOD PRESSURE RESPONSE, MAY INCREASE  UP TO 1 TABLET DAILY IF NEEDED  IN THE FUTURE, AS DIRECTED     Cardiovascular: Diuretics - Thiazide Failed - 03/28/2022  8:39 AM      Failed - Cr in normal range and within 180 days    Creat  Date Value Ref Range Status  07/21/2021 1.35 (H) 0.70 - 1.28 mg/dL Final         Failed - K in normal range and within 180 days    Potassium  Date Value Ref Range Status  07/21/2021 5.1 3.5 - 5.3 mmol/L Final         Failed - Na in normal range and within 180 days    Sodium  Date Value Ref Range Status  07/21/2021 137 135 - 146 mmol/L Final  08/20/2016 140 134 - 144 mmol/L Final         Passed - Last BP in normal range    BP Readings from Last 1 Encounters:  01/19/22 118/74         Passed - Valid encounter within last 6 months    Recent Outpatient Visits          2 months ago Essential hypertension   [facility] Olin Hauser, DO   8 months ago Essential hypertension   [place], DO   9 months ago Annual physical exam   [facility] Olin Hauser, DO   1 year ago Annual physical exam   [facility] Olin Hauser, DO   1 year ago Essential hypertension   [place], DO      Future Appointments            In 3 months Parks Ranger, Devonne Doughty, DO [facility], [facility]

## 2022-03-31 MED ORDER — HYDROCHLOROTHIAZIDE 25 MG PO TABS: ORAL_TABLET | ORAL | 0 refills | Status: DC

## 2022-03-31 NOTE — Telephone Encounter (Signed)
Please see previous note.

## 2022-03-31 NOTE — Addendum Note (Signed)
Addended by: Jearld Fenton on: 03/31/2022 03:15 PM   Modules accepted: Orders

## 2022-03-31 NOTE — Telephone Encounter (Signed)
I have fixed this RX

## 2022-03-31 NOTE — Telephone Encounter (Signed)
Pt called to report that [facility] contacted him and told him that they will not fill his prescription until October 10th. He says he needs 90 days instead of 80 because he takes 1 pill a day instead of half. Please advise

## 2022-04-29 ENCOUNTER — Other Ambulatory Visit: Payer: Self-pay | Admitting: Family Medicine

## 2022-04-29 DIAGNOSIS — E7849 Other hyperlipidemia: Secondary | ICD-10-CM

## 2022-04-29 NOTE — Telephone Encounter (Signed)
Requested Prescriptions  Pending Prescriptions Disp Refills  . simvastatin (ZOCOR) 40 MG tablet [Pharmacy Med Name: Simvastatin 40 MG Oral Tablet] 100 tablet 2    Sig: TAKE 1 TABLET BY MOUTH DAILY  WITH SUPPER     Cardiovascular:  Antilipid - Statins Failed - 04/29/2022  8:15 AM      Failed - Lipid Panel in normal range within the last 12 months    Cholesterol, Total  Date Value Ref Range Status  04/15/2015 144 100 - 199 mg/dL Final   Cholesterol  Date Value Ref Range Status  06/23/2021 162 <200 mg/dL Final   LDL Cholesterol (Calc)  Date Value Ref Range Status  06/23/2021 87 mg/dL (calc) Final    Comment:    Reference range: <100 . Desirable range <100 mg/dL for primary prevention;   <70 mg/dL for patients with CHD or diabetic patients  with > or = 2 CHD risk factors. Marland Kitchen LDL-C is now calculated using the Martin-Hopkins  calculation, which is a validated novel method providing  better accuracy than the Friedewald equation in the  estimation of LDL-C.  Cresenciano Genre et al. Annamaria Helling. 7782;423(53): 2061-2068  (http://education.[facility].com/faq/FAQ164)    HDL  Date Value Ref Range Status  06/23/2021 55 > OR = 40 mg/dL Final  04/15/2015 63 >39 mg/dL Final    Comment:    According to ATP-III Guidelines, HDL-C >59 mg/dL is considered a negative risk factor for CHD.    Triglycerides  Date Value Ref Range Status  06/23/2021 107 <150 mg/dL Final         Passed - Patient is not pregnant      Passed - Valid encounter within last 12 months    Recent Outpatient Visits          3 months ago Essential hypertension   [facility] Olin Hauser, DO   9 months ago Essential hypertension   [place], DO   10 months ago Annual physical exam   [facility] Olin Hauser, DO   1 year ago Annual physical exam   [facility] Olin Hauser, DO   2 years ago  Essential hypertension   [place], DO      Future Appointments            In 2 months Parks Ranger, Devonne Doughty, [facility], [facility]

## 2022-06-20 ENCOUNTER — Other Ambulatory Visit: Payer: Self-pay | Admitting: Internal Medicine

## 2022-06-20 DIAGNOSIS — I10 Essential (primary) hypertension: Secondary | ICD-10-CM

## 2022-06-22 NOTE — Telephone Encounter (Signed)
Requested Prescriptions  Pending Prescriptions Disp Refills   hydrochlorothiazide (HYDRODIURIL) 25 MG tablet [Pharmacy Med Name: hydroCHLOROthiazide 25 MG Oral Tablet] 90 tablet 0    Sig: TAKE ONE-HALF TABLET BY MOUTH  DAILY AS DIRECTED BASED ON BLOOD PRESSURE RESPONSE, MAY INCREASE  UP TO 1 TABLET DAILY IF NEEDED  IN THE FUTURE, AS DIRECTED     Cardiovascular: Diuretics - Thiazide Failed - 06/20/2022  5:29 AM      Failed - Cr in normal range and within 180 days    Creat  Date Value Ref Range Status  07/21/2021 1.35 (H) 0.70 - 1.28 mg/dL Final         Failed - K in normal range and within 180 days    Potassium  Date Value Ref Range Status  07/21/2021 5.1 3.5 - 5.3 mmol/L Final         Failed - Na in normal range and within 180 days    Sodium  Date Value Ref Range Status  07/21/2021 137 135 - 146 mmol/L Final  08/20/2016 140 134 - 144 mmol/L Final         Passed - Last BP in normal range    BP Readings from Last 1 Encounters:  01/19/22 118/74         Passed - Valid encounter within last 6 months    Recent Outpatient Visits           5 months ago Essential hypertension   [facility] Olin Hauser, DO   11 months ago Essential hypertension   [facility] Olin Hauser, DO   12 months ago Annual physical exam   [facility] Olin Hauser, DO   1 year ago Annual physical exam   [facility] Olin Hauser, DO   2 years ago Essential hypertension   [place], Devonne Doughty, DO       Future Appointments             In 1 month Parks Ranger, Devonne Doughty, [facility], [facility]

## 2022-07-08 ENCOUNTER — Other Ambulatory Visit: Payer: Self-pay | Admitting: Family Medicine

## 2022-07-08 DIAGNOSIS — E7849 Other hyperlipidemia: Secondary | ICD-10-CM

## 2022-07-10 NOTE — Telephone Encounter (Signed)
Unable to refill per protocol, Rx request is too soon. Last refill 04/29/22 for 90 days and 1 refill. Will refuse.  Requested Prescriptions  Pending Prescriptions Disp Refills   simvastatin (ZOCOR) 40 MG tablet [Pharmacy Med Name: Simvastatin 40 MG Oral Tablet] 100 tablet 2    Sig: TAKE 1 TABLET BY MOUTH DAILY  WITH SUPPER     Cardiovascular:  Antilipid - Statins Failed - 07/08/2022 10:17 AM      Failed - Lipid Panel in normal range within the last 12 months    Cholesterol, Total  Date Value Ref Range Status  04/15/2015 144 100 - 199 mg/dL Final   Cholesterol  Date Value Ref Range Status  06/23/2021 162 <200 mg/dL Final   LDL Cholesterol (Calc)  Date Value Ref Range Status  06/23/2021 87 mg/dL (calc) Final    Comment:    Reference range: <100 . Desirable range <100 mg/dL for primary prevention;   <70 mg/dL for patients with CHD or diabetic patients  with > or = 2 CHD risk factors. Marland Kitchen LDL-C is now calculated using the Martin-Hopkins  calculation, which is a validated novel method providing  better accuracy than the Friedewald equation in the  estimation of LDL-C.  Cresenciano Genre et al. Annamaria Helling. 4037;543(60): 2061-2068  (http://education.[facility].com/faq/FAQ164)    HDL  Date Value Ref Range Status  06/23/2021 55 > OR = 40 mg/dL Final  04/15/2015 63 >39 mg/dL Final    Comment:    According to ATP-III Guidelines, HDL-C >59 mg/dL is considered a negative risk factor for CHD.    Triglycerides  Date Value Ref Range Status  06/23/2021 107 <150 mg/dL Final         Passed - Patient is not pregnant      Passed - Valid encounter within last 12 months    Recent Outpatient Visits           5 months ago Essential hypertension   [facility] Olin Hauser, DO   11 months ago Essential hypertension   [place], Devonne Doughty, DO   1 year ago Annual physical exam   [facility] Olin Hauser, DO    2 years ago Annual physical exam   [facility] Olin Hauser, DO   2 years ago Essential hypertension   [place], DO       Future Appointments             In 2 weeks Parks Ranger, Devonne Doughty, [facility], [facility]

## 2022-07-17 ENCOUNTER — Other Ambulatory Visit: Payer: Self-pay

## 2022-07-17 DIAGNOSIS — R351 Nocturia: Secondary | ICD-10-CM

## 2022-07-17 DIAGNOSIS — I10 Essential (primary) hypertension: Secondary | ICD-10-CM

## 2022-07-17 DIAGNOSIS — R739 Hyperglycemia, unspecified: Secondary | ICD-10-CM

## 2022-07-17 DIAGNOSIS — E669 Obesity, unspecified: Secondary | ICD-10-CM

## 2022-07-17 DIAGNOSIS — Z0000 Encounter for general adult medical examination without abnormal findings: Secondary | ICD-10-CM

## 2022-07-17 DIAGNOSIS — E782 Mixed hyperlipidemia: Secondary | ICD-10-CM

## 2022-07-20 ENCOUNTER — Other Ambulatory Visit: Payer: Medicare Other

## 2022-07-20 DIAGNOSIS — R739 Hyperglycemia, unspecified: Secondary | ICD-10-CM | POA: Diagnosis not present

## 2022-07-20 DIAGNOSIS — E782 Mixed hyperlipidemia: Secondary | ICD-10-CM | POA: Diagnosis not present

## 2022-07-20 DIAGNOSIS — I10 Essential (primary) hypertension: Secondary | ICD-10-CM | POA: Diagnosis not present

## 2022-07-21 DIAGNOSIS — H353131 Nonexudative age-related macular degeneration, bilateral, early dry stage: Secondary | ICD-10-CM | POA: Diagnosis not present

## 2022-07-21 DIAGNOSIS — Z961 Presence of intraocular lens: Secondary | ICD-10-CM | POA: Diagnosis not present

## 2022-07-21 LAB — PSA: PSA: 0.4 ng/mL

## 2022-07-21 LAB — LIPID PANEL
Cholesterol: 165 mg/dL
HDL: 60 mg/dL
LDL Cholesterol (Calc): 84 mg/dL
Non-HDL Cholesterol (Calc): 105 mg/dL
Total CHOL/HDL Ratio: 2.8 (calc)
Triglycerides: 110 mg/dL

## 2022-07-21 LAB — COMPLETE METABOLIC PANEL WITH GFR
AG Ratio: 1.3 (calc) (ref 1.0–2.5)
ALT: 21 U/L (ref 9–46)
AST: 22 U/L (ref 10–35)
Albumin: 4.3 g/dL (ref 3.6–5.1)
Alkaline phosphatase (APISO): 58 U/L (ref 35–144)
BUN/Creatinine Ratio: 19 (calc) (ref 6–22)
BUN: 30 mg/dL — ABNORMAL HIGH (ref 7–25)
CO2: 31 mmol/L (ref 20–32)
Calcium: 10 mg/dL (ref 8.6–10.3)
Chloride: 97 mmol/L — ABNORMAL LOW (ref 98–110)
Creat: 1.59 mg/dL — ABNORMAL HIGH (ref 0.70–1.28)
Globulin: 3.3 g/dL (calc) (ref 1.9–3.7)
Glucose, Bld: 117 mg/dL — ABNORMAL HIGH (ref 65–99)
Potassium: 5.2 mmol/L (ref 3.5–5.3)
Sodium: 135 mmol/L (ref 135–146)
Total Bilirubin: 0.6 mg/dL (ref 0.2–1.2)
Total Protein: 7.6 g/dL (ref 6.1–8.1)
eGFR: 45 mL/min/{1.73_m2} — ABNORMAL LOW (ref >=60)

## 2022-07-21 LAB — CBC WITH DIFFERENTIAL/PLATELET
Absolute Monocytes: 634 cells/uL (ref 200–950)
Basophils Absolute: 50 cells/uL (ref 0–200)
Basophils Relative: 0.7 %
Eosinophils Absolute: 202 cells/uL (ref 15–500)
Eosinophils Relative: 2.8 %
HCT: 45.7 % (ref 38.5–50.0)
Hemoglobin: 15.5 g/dL (ref 13.2–17.1)
Lymphs Abs: 1908 cells/uL (ref 850–3900)
MCH: 31.9 pg (ref 27.0–33.0)
MCHC: 33.9 g/dL (ref 32.0–36.0)
MCV: 94 fL (ref 80.0–100.0)
MPV: 11.1 fL (ref 7.5–12.5)
Monocytes Relative: 8.8 %
Neutro Abs: 4406 cells/uL (ref 1500–7800)
Neutrophils Relative %: 61.2 %
Platelets: 291 10*3/uL (ref 140–400)
RBC: 4.86 10*6/uL (ref 4.20–5.80)
RDW: 12.4 % (ref 11.0–15.0)
Total Lymphocyte: 26.5 %
WBC: 7.2 10*3/uL (ref 3.8–10.8)

## 2022-07-21 LAB — HEMOGLOBIN A1C
Hgb A1c MFr Bld: 6.3 % of total Hgb — ABNORMAL HIGH (ref <5.7)
Mean Plasma Glucose: 134 mg/dL
eAG (mmol/L): 7.4 mmol/L

## 2022-07-27 ENCOUNTER — Encounter: Payer: Self-pay | Admitting: Family Medicine

## 2022-07-27 ENCOUNTER — Ambulatory Visit (INDEPENDENT_AMBULATORY_CARE_PROVIDER_SITE_OTHER): Payer: Medicare Other | Admitting: Family Medicine

## 2022-07-27 ENCOUNTER — Other Ambulatory Visit: Payer: Self-pay | Admitting: Family Medicine

## 2022-07-27 VITALS — BP 130/70 | HR 93 | Ht 71.0 in | Wt 227.4 lb

## 2022-07-27 DIAGNOSIS — R739 Hyperglycemia, unspecified: Secondary | ICD-10-CM

## 2022-07-27 DIAGNOSIS — E7849 Other hyperlipidemia: Secondary | ICD-10-CM

## 2022-07-27 DIAGNOSIS — I10 Essential (primary) hypertension: Secondary | ICD-10-CM | POA: Diagnosis not present

## 2022-07-27 DIAGNOSIS — Z23 Encounter for immunization: Secondary | ICD-10-CM | POA: Diagnosis not present

## 2022-07-27 DIAGNOSIS — Z0000 Encounter for general adult medical examination without abnormal findings: Secondary | ICD-10-CM

## 2022-07-27 DIAGNOSIS — I129 Hypertensive chronic kidney disease with stage 1 through stage 4 chronic kidney disease, or unspecified chronic kidney disease: Secondary | ICD-10-CM

## 2022-07-27 NOTE — Assessment & Plan Note (Signed)
Controlled HYPERTENSION - Home BP readings normal range  CKD III elevated creatinine   Plan:  1. Continue Lisinopril '40mg'$  daily 2. REDUCE HCTZ from 25 to 12.'5mg'$  - take HALF tab see if BP remains controlled and if reduces any strain on kidney function or fluid balance. 3. Encourage improved lifestyle - low sodium diet, regular exercise 4. Continue monitor BP outside office, bring readings to next visit, if persistently >140/90 or new symptoms notify office sooner  Check BMET in 3 months follow Cr trend, consider Referral to Nephrology if not improving. Goal to improve hydration, limit Advil PM can take more Tylenol

## 2022-07-27 NOTE — Progress Notes (Signed)
Subjective:    Patient ID: Jeff Ryan, male    DOB: 1946-03-16, 77 y.o.   MRN: [id number]  Jeff Ryan is a 77 y.o. male presenting on 07/27/2022 for Annual Exam   HPI  Here for Annual Physical and Lab Review.  CHRONIC HYPERTENSION / CKD-III with elevated Creatinine Monitoring Home BP controlled. Current Meds - Lisinopril '40mg'$  (PM), HCTZ '25mg'$  daily (AM) Reports good compliance, took meds today. Tolerating well, w/o complaints. Admits nocturia overnight He has had history of kidney stones for 30+ years, has had several removed. He takes Advil PM Gel tab. Denies CP, dyspnea, HA, edema, dizziness / lightheadedness   Obesity BMI >31 A1c 5.8 up to 6.3 now CBGs: Not checking. Meds: Never on meds Currently on ACEi Lifestyle: - Diet (holiday sweets inc his A1c, inc carb) - Exercise (Goal to resume exercise) - Weight down overall 7 lbs Denies hypoglycemia, polyuria, visual changes, numbness or tingling.    HYPERLIPIDEMIA: - Reports no concerns. Last lipid 07/2022, well controlled - Currently taking Simvastatin '40mg'$ , tolerating well without side effects or myalgias - Taking Omega 3 fish oil '1000mg'$  x 3 daily, for many years, also thinks this helps his joints   Former smoker Quit 2008 History of flare up with bronchitis episodes in past, had one bad episode with hemoptysis, had a bronchoscopy in hospital, did not identify any malignancy or other problem. He had a good report from bronchoscopy. He had a CTA Angiogram that identified a burst blood vessel. He has remained smoke free since that time. - No history of COPD diagnosed   History of kidney stones - now on Kidney COP (Calcium Oxalate Protector) supplement OTC with minimal problems in months. One minor episode in 1 year.   Right Parotid Cyst Followed by  ENT Dr Richardson Landry - had a parotid cyst biopsy, prior imaging and it was thought to be a mass but then drainage and reassurance that it was not a mass. Now  increased swelling again, he will schedule with ENT   Health Maintenance:   Flu shot today.  PSA 0.40 (negative, 07/2022), previously 0.32  Cologuard 04/2020 negative. Due next 3 years, 04/2023     07/27/2022    8:41 AM 01/19/2022    8:55 AM 07/21/2021    8:21 AM  Depression screen PHQ 2/9  Decreased Interest 0 0 0  Down, Depressed, Hopeless 0 0 0  PHQ - 2 Score 0 0 0  Altered sleeping 0 0 0  Tired, decreased energy 0 0 0  Change in appetite 0 0 0  Feeling bad or failure about yourself  0 0 0  Trouble concentrating 0 0 0  Moving slowly or fidgety/restless 0 0 0  Suicidal thoughts 0 0 0  PHQ-9 Score 0 0 0  Difficult doing work/chores Not difficult at all Not difficult at all Not difficult at all    Past Medical History:  Diagnosis Date   Dyslipidemia    Essential hypertension    Past Surgical History:  Procedure Laterality Date   CATARACT EXTRACTION, BILATERAL     left 10/26/2014 right 10/12/2014   TONSILLECTOMY AND ADENOIDECTOMY     US ECHOCARDIOGRAPHY     10/2014   VASECTOMY     Social History   Socioeconomic History   Marital status: Married    Spouse name: Not on file   Number of children: Not on file   Years of education: Not on file   Highest education level: Not on file  Occupational History  Occupation: Retired Multimedia programmer)    [place] in [place], traveled around [place], retired age 82  Tobacco Use   Smoking status: Former    Types: Cigarettes    Quit date: 01/11/2007    Years since quitting: 15.5   Smokeless tobacco: Former  Scientific laboratory technician Use: Never used  Substance and Sexual Activity   Alcohol use: Yes    Alcohol/week: 0.0 standard drinks of alcohol    Comment: beer x2 weekly   Drug use: No   Sexual activity: Not on file  Other Topics Concern   Not on file  Social History Narrative   Not on file   Social Determinants of Health   Financial Resource Strain: Not on file  Food Insecurity: Not on file  Transportation Needs: Not  on file  Physical Activity: Not on file  Stress: Not on file  Social Connections: Not on file  Intimate Partner Violence: Not on file   Family History  Problem Relation Age of Onset   Heart disease Brother    Heart attack Brother        x 3   Prostate cancer Neg Hx    Colon cancer Neg Hx    Current Outpatient Medications on File Prior to Visit  Medication Sig   aspirin EC 81 MG tablet Take 1 tablet (81 mg total) by mouth daily.   lisinopril (ZESTRIL) 40 MG tablet TAKE 1 TABLET BY MOUTH DAILY   Omega 3 1000 MG CAPS Take 1 capsule (1,000 mg total) by mouth 3 (three) times daily.   OVER THE COUNTER MEDICATION Take 2 tablets by mouth 2 (two) times daily with a meal. Kidney Cop (Calcium Oxalate Protector)   simvastatin (ZOCOR) 40 MG tablet TAKE 1 TABLET BY MOUTH DAILY  WITH SUPPER   hydrochlorothiazide (HYDRODIURIL) 25 MG tablet Take 0.5 tablets (12.5 mg total) by mouth daily.   No current facility-administered medications on file prior to visit.    Review of Systems  Constitutional:  Negative for activity change, appetite change, chills, diaphoresis, fatigue and fever.  HENT:  Negative for congestion and hearing loss.   Eyes:  Negative for visual disturbance.  Respiratory:  Negative for cough, chest tightness, shortness of breath and wheezing.   Cardiovascular:  Negative for chest pain, palpitations and leg swelling.  Gastrointestinal:  Negative for abdominal pain, constipation, diarrhea, nausea and vomiting.  Genitourinary:  Negative for dysuria, frequency and hematuria.  Musculoskeletal:  Negative for arthralgias and neck pain.  Skin:  Negative for rash.  Neurological:  Negative for dizziness, weakness, light-headedness, numbness and headaches.  Hematological:  Negative for adenopathy.  Psychiatric/Behavioral:  Negative for behavioral problems, dysphoric mood and sleep disturbance.    Per HPI unless specifically indicated above     Objective:    BP 130/70   Pulse 93   Ht  '5\' 11"'$  (1.803 m)   Wt 227 lb 6.4 oz (103.1 kg)   SpO2 100%   BMI 31.72 kg/m   Wt Readings from Last 3 Encounters:  07/27/22 227 lb 6.4 oz (103.1 kg)  01/19/22 227 lb 12.8 oz (103.3 kg)  09/29/21 234 lb (106.1 kg)    Physical Exam Vitals and nursing note reviewed.  Constitutional:      General: He is not in acute distress.    Appearance: He is well-developed. He is not diaphoretic.     Comments: Well-appearing, comfortable, cooperative  HENT:     Head: Normocephalic and atraumatic.  Eyes:  General:        Right eye: No discharge.        Left eye: No discharge.     Conjunctiva/sclera: Conjunctivae normal.     Pupils: Pupils are equal, round, and reactive to light.  Neck:     Thyroid: No thyromegaly.  Cardiovascular:     Rate and Rhythm: Normal rate and regular rhythm.     Pulses: Normal pulses.     Heart sounds: Normal heart sounds. No murmur heard. Pulmonary:     Effort: Pulmonary effort is normal. No respiratory distress.     Breath sounds: Normal breath sounds. No wheezing or rales.  Abdominal:     General: Bowel sounds are normal. There is no distension.     Palpations: Abdomen is soft. There is no mass.     Tenderness: There is no abdominal tenderness.  Musculoskeletal:        General: No tenderness. Normal range of motion.     Cervical back: Normal range of motion and neck supple.     Right lower leg: No edema.     Left lower leg: No edema.     Comments: Upper / Lower Extremities: - Normal muscle tone, strength bilateral upper extremities 5/5, lower extremities 5/5  Lymphadenopathy:     Cervical: No cervical adenopathy.  Skin:    General: Skin is warm and dry.     Findings: No erythema or rash.  Neurological:     Mental Status: He is alert and oriented to person, place, and time.     Comments: Distal sensation intact to light touch all extremities  Psychiatric:        Mood and Affect: Mood normal.        Behavior: Behavior normal.        Thought Content:  Thought content normal.     Comments: Well groomed, good eye contact, normal speech and thoughts     Results for orders placed or performed in visit on 07/17/22  PSA  Result Value Ref Range   PSA 0.40 < OR = 4.00 ng/mL  Hemoglobin A1c  Result Value Ref Range   Hgb A1c MFr Bld 6.3 (H) <5.7 % of total Hgb   Mean Plasma Glucose 134 mg/dL   eAG (mmol/L) 7.4 mmol/L  Lipid panel  Result Value Ref Range   Cholesterol 165 <200 mg/dL   HDL 60 > OR = 40 mg/dL   Triglycerides 110 <150 mg/dL   LDL Cholesterol (Calc) 84 mg/dL (calc)   Total CHOL/HDL Ratio 2.8 <5.0 (calc)   Non-HDL Cholesterol (Calc) 105 <130 mg/dL (calc)  CBC with Differential/Platelet  Result Value Ref Range   WBC 7.2 3.8 - 10.8 Thousand/uL   RBC 4.86 4.20 - 5.80 Million/uL   Hemoglobin 15.5 13.2 - 17.1 g/dL   HCT 45.7 38.5 - 50.0 %   MCV 94.0 80.0 - 100.0 fL   MCH 31.9 27.0 - 33.0 pg   MCHC 33.9 32.0 - 36.0 g/dL   RDW 12.4 11.0 - 15.0 %   Platelets 291 140 - 400 Thousand/uL   MPV 11.1 7.5 - 12.5 fL   Neutro Abs 4,406 1,500 - 7,800 cells/uL   Lymphs Abs 1,908 850 - 3,900 cells/uL   Absolute Monocytes 634 200 - 950 cells/uL   Eosinophils Absolute 202 15 - 500 cells/uL   Basophils Absolute 50 0 - 200 cells/uL   Neutrophils Relative % 61.2 %   Total Lymphocyte 26.5 %   Monocytes Relative 8.8 %  Eosinophils Relative 2.8 %   Basophils Relative 0.7 %  COMPLETE METABOLIC PANEL WITH GFR  Result Value Ref Range   Glucose, Bld 117 (H) 65 - 99 mg/dL   BUN 30 (H) 7 - 25 mg/dL   Creat 1.59 (H) 0.70 - 1.28 mg/dL   eGFR 45 (L) > OR = 60 mL/min/1.68m   BUN/Creatinine Ratio 19 6 - 22 (calc)   Sodium 135 135 - 146 mmol/L   Potassium 5.2 3.5 - 5.3 mmol/L   Chloride 97 (L) 98 - 110 mmol/L   CO2 31 20 - 32 mmol/L   Calcium 10.0 8.6 - 10.3 mg/dL   Total Protein 7.6 6.1 - 8.1 g/dL   Albumin 4.3 3.6 - 5.1 g/dL   Globulin 3.3 1.9 - 3.7 g/dL (calc)   AG Ratio 1.3 1.0 - 2.5 (calc)   Total Bilirubin 0.6 0.2 - 1.2 mg/dL    Alkaline phosphatase (APISO) 58 35 - 144 U/L   AST 22 10 - 35 U/L   ALT 21 9 - 46 U/L      Assessment & Plan:   Problem List Items Addressed This Visit     Benign hypertension with CKD (chronic kidney disease) stage III (HCC)    Controlled HYPERTENSION - Home BP readings normal range  CKD III elevated creatinine   Plan:  1. Continue Lisinopril '40mg'$  daily 2. REDUCE HCTZ from 25 to 12.'5mg'$  - take HALF tab see if BP remains controlled and if reduces any strain on kidney function or fluid balance. 3. Encourage improved lifestyle - low sodium diet, regular exercise 4. Continue monitor BP outside office, bring readings to next visit, if persistently >140/90 or new symptoms notify office sooner  Check BMET in 3 months follow Cr trend, consider Referral to Nephrology if not improving. Goal to improve hydration, limit Advil PM can take more Tylenol      Relevant Medications   hydrochlorothiazide (HYDRODIURIL) 25 MG tablet   Hyperlipidemia    Controlled LDL The 10-year ASCVD risk score (Arnett DK, et al., 2019) is: 27.8% Fam history MI CAD brother  Plan: 1. Continue current meds - Simvastatin '40mg'$  daily, Omega 3 2. Continue ASA '81mg'$  for primary ASCVD risk reduction 3. Encourage improved lifestyle - low carb/cholesterol, reduce portion size, continue improving regular exercise      Relevant Medications   hydrochlorothiazide (HYDRODIURIL) 25 MG tablet   Other Visit Diagnoses     Annual physical exam    -  Primary   Needs flu shot       Relevant Orders   Flu Vaccine QUAD High Dose(Fluad) (Completed)       Updated Health Maintenance information Reviewed recent lab results with patient Encouraged improvement to lifestyle with diet and exercise Goal of weight loss  See above Elevated Creatinine, which means some decline in kidney function. This may be due to some reduced hydration water intake. Reduce fluid pill Hydrochlorothiazide '25mg'$  down by HALF, pill cutter. Take half tab  daily.  Additionally, the AGoodridgemay be contributing to kidney decline, can consider switching to Tylenol-PM. Tylenol Ext Strength OR Tylenol PM = '500mg'$  x2 = '1000mg'$    Orders Placed This Encounter  Procedures   Flu Vaccine QUAD High Dose(Fluad)     No orders of the defined types were placed in this encounter.    Follow up plan: Return in about 3 months (around 10/26/2022) for 3 month fasting lab then return 1 week later Follow up results, Kidney, PreDM.  repeat lab 3 months  BMET + A1c  Nobie Putnam, [facility] 07/27/2022, 8:48 AM

## 2022-07-27 NOTE — Assessment & Plan Note (Signed)
Controlled LDL The 10-year ASCVD risk score (Arnett DK, et al., 2019) is: 27.8% Fam history MI CAD brother  Plan: 1. Continue current meds - Simvastatin '40mg'$  daily, Omega 3 2. Continue ASA '81mg'$  for primary ASCVD risk reduction 3. Encourage improved lifestyle - low carb/cholesterol, reduce portion size, continue improving regular exercise

## 2022-07-27 NOTE — Patient Instructions (Addendum)
Thank you for coming to the office today.  Elevated Creatinine, which means some decline in kidney function.  This may be due to some reduced hydration water intake.  Reduce fluid pill Hydrochlorothiazide '25mg'$  down by HALF, pill cutter. Take half tab daily.  I would suggest repeat blood in about 3 months and we can also check sugar A1c.  If still elevated Creatinine, we can refer to Urologist.  Additionally, the Hardy may be contributing to kidney decline, can consider switching to Tylenol-PM.  Tylenol Ext Strength OR Tylenol PM = '500mg'$  x2 = '1000mg'$   Recent Labs    07/20/22 0820  HGBA1C 6.3*   DUE for FASTING BLOOD WORK (no food or drink after midnight before the lab appointment, only water or coffee without cream/sugar on the morning of)  SCHEDULE "Lab Only" visit in the morning at the clinic for lab draw in 3 MONTHS   - Make sure Lab Only appointment is at about 1 week before your next appointment, so that results will be available  For Lab Results, once available within 2-3 days of blood draw, you can can log in to MyChart online to view your results and a brief explanation. Also, we can discuss results at next follow-up visit.    Please schedule a Follow-up Appointment to: Return in about 3 months (around 10/26/2022) for 3 month fasting lab then return 1 week later Follow up results, Kidney, PreDM.  If you have any other questions or concerns, please feel free to call the office or send a message through [place]. You may also schedule an earlier appointment if necessary.  Additionally, you may be receiving a survey about your experience at our office within a few days to 1 week by e-mail or mail. We value your feedback.  Nobie Putnam, DO [place]

## 2022-09-01 ENCOUNTER — Other Ambulatory Visit: Payer: Self-pay | Admitting: Internal Medicine

## 2022-09-01 DIAGNOSIS — I10 Essential (primary) hypertension: Secondary | ICD-10-CM

## 2022-09-02 NOTE — Telephone Encounter (Signed)
Rx 07/27/22 #45 3RF- too soon Requested Prescriptions  Pending Prescriptions Disp Refills   hydrochlorothiazide (HYDRODIURIL) 25 MG tablet [Pharmacy Med Name: hydroCHLOROthiazide 25 MG Oral Tablet] 90 tablet 3    Sig: TAKE ONE-HALF TABLET BY MOUTH  DAILY AS DIRECTED BASED ON BLOOD PRESSURE RESPONSE, MAY INCREASE  UP TO 1 TABLET DAILY IF NEEDED  IN THE FUTURE, AS DIRECTED     Cardiovascular: Diuretics - Thiazide Failed - 09/01/2022 10:53 PM      Failed - Cr in normal range and within 180 days    Creat  Date Value Ref Range Status  07/20/2022 1.59 (H) 0.70 - 1.28 mg/dL Final         Passed - K in normal range and within 180 days    Potassium  Date Value Ref Range Status  07/20/2022 5.2 3.5 - 5.3 mmol/L Final         Passed - Na in normal range and within 180 days    Sodium  Date Value Ref Range Status  07/20/2022 135 135 - 146 mmol/L Final  08/20/2016 140 134 - 144 mmol/L Final         Passed - Last BP in normal range    BP Readings from Last 1 Encounters:  07/27/22 130/70         Passed - Valid encounter within last 6 months    Recent Outpatient Visits           1 month ago Annual physical exam   [place], Alexander J, DO   7 months ago Essential hypertension   [place], [place], DO   1 year ago Essential hypertension   [place], DO   1 year ago Annual physical exam   [facility] Olin Hauser, DO   2 years ago Annual physical exam   [facility] Olin Hauser, DO       Future Appointments             In 2 months Parks Ranger, Devonne Doughty, [facility], [facility]

## 2022-09-21 ENCOUNTER — Other Ambulatory Visit: Payer: Self-pay | Admitting: Family Medicine

## 2022-09-21 DIAGNOSIS — E7849 Other hyperlipidemia: Secondary | ICD-10-CM

## 2022-09-22 NOTE — Telephone Encounter (Signed)
Rx-  04/29/22 #90 1RF- too soon Requested Prescriptions  Pending Prescriptions Disp Refills   simvastatin (ZOCOR) 40 MG tablet [Pharmacy Med Name: Simvastatin 40 MG Oral Tablet] 80 tablet 3    Sig: TAKE 1 TABLET BY MOUTH DAILY  WITH SUPPER     Cardiovascular:  Antilipid - Statins Failed - 09/21/2022 10:17 PM      Failed - Lipid Panel in normal range within the last 12 months    Cholesterol, Total  Date Value Ref Range Status  04/15/2015 144 100 - 199 mg/dL Final   Cholesterol  Date Value Ref Range Status  07/20/2022 165 <200 mg/dL Final   LDL Cholesterol (Calc)  Date Value Ref Range Status  07/20/2022 84 mg/dL (calc) Final    Comment:    Reference range: <100 . Desirable range <100 mg/dL for primary prevention;   <70 mg/dL for patients with CHD or diabetic patients  with > or = 2 CHD risk factors. Marland Kitchen LDL-C is now calculated using the Martin-Hopkins  calculation, which is a validated novel method providing  better accuracy than the Friedewald equation in the  estimation of LDL-C.  Cresenciano Genre et al. Annamaria Helling. [id number]): 2061-2068  (http://education.[facility].com/faq/FAQ164)    HDL  Date Value Ref Range Status  07/20/2022 60 > OR = 40 mg/dL Final  04/15/2015 63 >39 mg/dL Final    Comment:    According to ATP-III Guidelines, HDL-C >59 mg/dL is considered a negative risk factor for CHD.    Triglycerides  Date Value Ref Range Status  07/20/2022 110 <150 mg/dL Final         Passed - Patient is not pregnant      Passed - Valid encounter within last 12 months    Recent Outpatient Visits           1 month ago Annual physical exam   [place], Alexander J, DO   8 months ago Essential hypertension   [place], [place], DO   1 year ago Essential hypertension   [place], DO   1 year ago Annual physical exam   [facility] Olin Hauser, DO   2 years ago Annual physical exam   [facility] Olin Hauser, DO       Future Appointments             In 1 month Parks Ranger, Devonne Doughty, [facility], [facility]

## 2022-10-21 ENCOUNTER — Other Ambulatory Visit: Payer: Self-pay | Admitting: Family Medicine

## 2022-10-21 DIAGNOSIS — I10 Essential (primary) hypertension: Secondary | ICD-10-CM

## 2022-10-22 NOTE — Telephone Encounter (Signed)
Requested Prescriptions  Refused Prescriptions Disp Refills   lisinopril (ZESTRIL) 40 MG tablet [Pharmacy Med Name: Lisinopril 40 MG Oral Tablet] 100 tablet 2    Sig: TAKE 1 TABLET BY MOUTH DAILY     Cardiovascular:  ACE Inhibitors Failed - 10/21/2022 10:11 PM      Failed - Cr in normal range and within 180 days    Creat  Date Value Ref Range Status  07/20/2022 1.59 (H) 0.70 - 1.28 mg/dL Final         Passed - K in normal range and within 180 days    Potassium  Date Value Ref Range Status  07/20/2022 5.2 3.5 - 5.3 mmol/L Final         Passed - Patient is not pregnant      Passed - Last BP in normal range    BP Readings from Last 1 Encounters:  07/27/22 130/70         Passed - Valid encounter within last 6 months    Recent Outpatient Visits           2 months ago Annual physical exam   [place] [facility] [name], DO   9 months ago Essential hypertension   [place] [facility] [name], DO   1 year ago Essential hypertension   [place] [facility] [name], DO   1 year ago Annual physical exam   [place] [facility] [name], DO   2 years ago Annual physical exam   [place] [facility] [name], DO       Future Appointments             In 1 week Althea Charon, Netta Neat, DO [place] [facility], [facility]

## 2022-10-23 ENCOUNTER — Other Ambulatory Visit: Payer: Self-pay

## 2022-10-23 DIAGNOSIS — N183 Chronic kidney disease, stage 3 unspecified: Secondary | ICD-10-CM

## 2022-10-23 DIAGNOSIS — R739 Hyperglycemia, unspecified: Secondary | ICD-10-CM

## 2022-10-26 ENCOUNTER — Other Ambulatory Visit: Payer: Medicare Other

## 2022-10-26 DIAGNOSIS — I129 Hypertensive chronic kidney disease with stage 1 through stage 4 chronic kidney disease, or unspecified chronic kidney disease: Secondary | ICD-10-CM | POA: Diagnosis not present

## 2022-10-26 DIAGNOSIS — N183 Chronic kidney disease, stage 3 unspecified: Secondary | ICD-10-CM | POA: Diagnosis not present

## 2022-10-26 DIAGNOSIS — R739 Hyperglycemia, unspecified: Secondary | ICD-10-CM | POA: Diagnosis not present

## 2022-10-27 LAB — BASIC METABOLIC PANEL WITH GFR
BUN/Creatinine Ratio: 19 (calc) (ref 6–22)
BUN: 29 mg/dL — ABNORMAL HIGH (ref 7–25)
CO2: 28 mmol/L (ref 20–32)
Calcium: 9.8 mg/dL (ref 8.6–10.3)
Chloride: 92 mmol/L — ABNORMAL LOW (ref 98–110)
Creat: 1.5 mg/dL — ABNORMAL HIGH (ref 0.70–1.28)
Glucose, Bld: 101 mg/dL — ABNORMAL HIGH (ref 65–99)
Potassium: 5.2 mmol/L (ref 3.5–5.3)
Sodium: 131 mmol/L — ABNORMAL LOW (ref 135–146)
eGFR: 48 mL/min/{1.73_m2} — ABNORMAL LOW (ref >=60)

## 2022-10-27 LAB — HEMOGLOBIN A1C
Hgb A1c MFr Bld: 6.2 % of total Hgb — ABNORMAL HIGH (ref <5.7)
Mean Plasma Glucose: 131 mg/dL
eAG (mmol/L): 7.3 mmol/L

## 2022-11-02 ENCOUNTER — Ambulatory Visit (INDEPENDENT_AMBULATORY_CARE_PROVIDER_SITE_OTHER): Payer: Medicare Other | Admitting: Family Medicine

## 2022-11-02 ENCOUNTER — Encounter: Payer: Self-pay | Admitting: Family Medicine

## 2022-11-02 VITALS — BP 132/70 | HR 67 | Ht 71.0 in | Wt 209.0 lb

## 2022-11-02 DIAGNOSIS — I129 Hypertensive chronic kidney disease with stage 1 through stage 4 chronic kidney disease, or unspecified chronic kidney disease: Secondary | ICD-10-CM | POA: Diagnosis not present

## 2022-11-02 DIAGNOSIS — E782 Mixed hyperlipidemia: Secondary | ICD-10-CM | POA: Diagnosis not present

## 2022-11-02 DIAGNOSIS — E663 Overweight: Secondary | ICD-10-CM | POA: Diagnosis not present

## 2022-11-02 DIAGNOSIS — N183 Chronic kidney disease, stage 3 unspecified: Secondary | ICD-10-CM

## 2022-11-02 DIAGNOSIS — F5101 Primary insomnia: Secondary | ICD-10-CM | POA: Diagnosis not present

## 2022-11-02 MED ORDER — TRAZODONE HCL 50 MG PO TABS: 50.0000 mg | ORAL_TABLET | Freq: Every evening | ORAL | 0 refills | Status: DC | PRN

## 2022-11-02 NOTE — Progress Notes (Signed)
Subjective:    Patient ID: Jeff Ryan, male    DOB: [birth year], 77 y.o.   MRN: [id number]  Eisen Robenson is a 77 y.o. male presenting on 11/02/2022 for Medical Management of Chronic Issues   HPI  Here for Annual Physical and Lab Review.  Insomnia Previously took Advil PM and it was successful 8 hr sleep but has come off of this medication to avoid kidney issue. He gets only 5 hr sleep now. His wife takes Trazodone  regularly   CHRONIC HYPERTENSION / CKD-III with elevated Creatinine Since last visit he discontinued HCTZ due to low BP reading and side effect, he has checked home BP. Monitoring Home BP controlled. Current Meds - Lisinopril  (PM) - OFF HCTZ Reports good compliance, took meds today. Tolerating well, w/o complaints. Admits nocturia overnight He has had history of kidney stones for 30+ years, has had several removed. He takes Advil PM Gel tab. Denies CP, dyspnea, HA, edema, dizziness / lightheadedness   Overweight BMI >29 A1c 6.2, slightly elevated CBGs: Not checking. Meds: Never on meds Currently on ACEi Lifestyle: - Diet (holiday sweets inc his A1c, inc carb) - Exercise (Goal to resume exercise) Wt down 20 lbs in 3 months Denies hypoglycemia, polyuria, visual changes, numbness or tingling.    HYPERLIPIDEMIA: - Reports no concerns. Last lipid 07/2022, well controlled - Currently taking Simvastatin , tolerating well without side effects or myalgias - Taking Omega 3 fish oil  x 3 daily, for many years, also thinks this helps his joints   Former smoker Quit 2008   History of kidney stones - now on Kidney COP (Calcium Oxalate Protector) supplement OTC with minimal problems in months. One minor episode in 1 year.     Health Maintenance:   Flu shot today.   PSA 0.40 (negative, 07/2022), previously 0.32   Cologuard 04/2020 negative. Due next 3 years, 04/2023      11/02/2022    8:30 AM 07/27/2022    8:41 AM 01/19/2022    8:55 AM   Depression screen PHQ 2/9  Decreased Interest 0 0 0  Down, Depressed, Hopeless 0 0 0  PHQ - 2 Score 0 0 0  Altered sleeping 0 0 0  Tired, decreased energy 0 0 0  Change in appetite 0 0 0  Feeling bad or failure about yourself  0 0 0  Trouble concentrating 0 0 0  Moving slowly or fidgety/restless 0 0 0  Suicidal thoughts 0 0 0  PHQ-9 Score 0 0 0  Difficult doing work/chores  Not difficult at all Not difficult at all    Social History   Tobacco Use   Smoking status: Former    Types: Cigarettes    Quit date: 01/11/2007    Years since quitting: 15.8   Smokeless tobacco: Former  Building services engineer Use: Never used  Substance Use Topics   Alcohol use: Yes    Alcohol/week: 0.0 standard drinks of alcohol    Comment: beer x2 weekly   Drug use: No    Review of Systems Per HPI unless specifically indicated above     Objective:    BP 132/70 (BP Location: Left Arm, Cuff Size: Normal)   Pulse 67   Ht  (1.803 m)   Wt 209 lb (94.8 kg)   BMI 29.15 kg/m   Wt Readings from Last 3 Encounters:  11/02/22 209 lb (94.8 kg)  07/27/22 227 lb 6.4 oz (103.1 kg)  01/19/22 227 lb 12.8 oz (  103.3 kg)    Physical Exam Vitals and nursing note reviewed.  Constitutional:      General: He is not in acute distress.    Appearance: Normal appearance. He is well-developed. He is not diaphoretic.     Comments: Well-appearing, comfortable, cooperative  HENT:     Head: Normocephalic and atraumatic.  Eyes:     General:        Right eye: No discharge.        Left eye: No discharge.     Conjunctiva/sclera: Conjunctivae normal.  Cardiovascular:     Rate and Rhythm: Normal rate.  Pulmonary:     Effort: Pulmonary effort is normal.  Skin:    General: Skin is warm and dry.     Findings: No erythema or rash.  Neurological:     Mental Status: He is alert and oriented to person, place, and time.  Psychiatric:        Mood and Affect: Mood normal.        Behavior: Behavior normal.         Thought Content: Thought content normal.     Comments: Well groomed, good eye contact, normal speech and thoughts       Results for orders placed or performed in visit on 10/23/22  Hemoglobin A1c  Result Value Ref Range   Hgb A1c MFr Bld 6.2 (H) <5.7 % of total Hgb   Mean Plasma Glucose 131 mg/dL   eAG (mmol/L) 7.3 mmol/L  BASIC METABOLIC PANEL WITH GFR  Result Value Ref Range   Glucose, Bld 101 (H) 65 - 99 mg/dL   BUN 29 (H) 7 - 25 mg/dL   Creat 1.61 (H) 0.96 - 1.28 mg/dL   eGFR 48 (L) > OR = 60 mL/min/1.60m2   BUN/Creatinine Ratio 19 6 - 22 (calc)   Sodium 131 (L) 135 - 146 mmol/L   Potassium 5.2 3.5 - 5.3 mmol/L   Chloride 92 (L) 98 - 110 mmol/L   CO2 28 20 - 32 mmol/L   Calcium 9.8 8.6 - 10.3 mg/dL      Assessment & Plan:   Problem List Items Addressed This Visit     Benign hypertension with CKD (chronic kidney disease) stage III - Primary   Relevant Orders   Ambulatory referral to Nephrology   Hyperlipidemia   Overweight (BMI 25.0-29.9)   Other Visit Diagnoses     Primary insomnia       Relevant Medications   traZODone (DESYREL) 50 MG tablet      HYPERTENSION = controlled Complication CKD III  Referral to Nephrologist for further evaluation of CKD-III with some progression gradually in past 1 year, he has HYPERTENSION. He took NSAIDs for years but has discontinued NSAID Aspirin now. He is on Lisinopril daily. May benefit from SGLT2 additional therapy.   Future consider Jardiance or Marcelline Deist, for CKD / renal protection  [facility] [address] [place], [place] [address] Phone: [phone]   ---------------------------------  Insomnia  Discontinue Tylenol PM / sleep aid  Try Trazodone 50mg  nightly before bed for insomnia, take it regularly  A1c sugar 6.2% slightly improved, but still higher than average.   Orders Placed This Encounter  Procedures   Ambulatory referral to Nephrology    Referral  Priority:   Routine    Referral Type:   Consultation    Referral Reason:   Specialty Services Required    Requested Specialty:   Nephrology    Number  of Visits Requested:   1     Meds ordered this encounter  Medications   traZODone (DESYREL) 50 MG tablet    Sig: Take 1 tablet (50 mg total) by mouth at bedtime as needed for sleep.    Dispense:  30 tablet    Refill:  0     Follow up plan: Return in about 6 months (around 05/04/2023) for 6 month PreDM A1c, Insomnia, HTN, Kidney updates.   Saralyn Pilar, DO [facility] 11/02/2022, 8:45 AM

## 2022-11-02 NOTE — Patient Instructions (Addendum)
Thank you for coming to the office today.  Kidney function appears stable since last check, but still Creatinine higher than previous readings 1 yr ago  I agree with referral to Nephrology Kidney Specialist  They may consider new medication  Jardiance or Marcelline Deist, one of these a day can provide kidney prevention  [facility] [address] [place], [place] [address] Phone: [phone]   ---------------------------------  Try Trazodone  nightly before bed for insomnia, take it regularly. It is same as your wife's medication. Her dose is  however.  A1c sugar 6.2% slightly improved, but still higher than average.  We can closely monitor this.   Please schedule a Follow-up Appointment to: Return in about 6 months (around 05/04/2023) for 6 month PreDM A1c, Insomnia, HTN, Kidney updates.  If you have any other questions or concerns, please feel free to call the office or send a message through MyChart. You may also schedule an earlier appointment if necessary.  Additionally, you may be receiving a survey about your experience at our office within a few days to 1 week by e-mail or mail. We value your feedback.  Saralyn Pilar, DO [facility], [place]

## 2022-11-03 ENCOUNTER — Encounter: Payer: Self-pay | Admitting: Family Medicine

## 2022-11-05 ENCOUNTER — Other Ambulatory Visit: Payer: Self-pay | Admitting: Family Medicine

## 2022-11-05 DIAGNOSIS — F5101 Primary insomnia: Secondary | ICD-10-CM

## 2022-11-05 NOTE — Telephone Encounter (Signed)
Please notify patient that based on his request with Trazodone. He should just take double dose to try it first.  He has Trazodone  tabs, I ordered 30 pills on 11/02/22. He said it is not effective.  He may try taking 2 tabs at once = .  Please call back or message if it is successful and I can re order at Trazodone  dose.  But I would not re order so soon until we know if it will work.  Saralyn Pilar, DO [facility] 11/05/2022, 1:07 PM

## 2022-11-13 ENCOUNTER — Other Ambulatory Visit: Payer: Self-pay | Admitting: Family Medicine

## 2022-11-13 ENCOUNTER — Telehealth: Payer: Self-pay | Admitting: Pharmacy Technician

## 2022-11-13 DIAGNOSIS — F5101 Primary insomnia: Secondary | ICD-10-CM

## 2022-11-13 MED ORDER — TRAZODONE HCL 100 MG PO TABS: 100.0000 mg | ORAL_TABLET | Freq: Every day | ORAL | 0 refills | Status: DC

## 2022-11-13 NOTE — Progress Notes (Signed)
[facility])                                            [facility] Quality Pharmacy Team    11/13/2022  Jeff Ryan March 25, 1946 [id number]  Received the following my chart response from patient this morning in reagard to [facility] quality measure:  [name], Dr Kirtland Bouchard. changed my dose on the Trazone 50mg  tablet. He has me taking 2 tables = 100 mg. tablets. He said when I am about out he would have a 90 day 100 mg. tablets. I am down to 3 days and need the prescription filled at [facility], [place], [place].  Will forward to Dr. Althea Charon' CMA Darrol Angel.  [name], CPhT [facility]  [phone]'

## 2022-12-07 ENCOUNTER — Other Ambulatory Visit: Payer: Self-pay | Admitting: Family Medicine

## 2022-12-07 DIAGNOSIS — F5101 Primary insomnia: Secondary | ICD-10-CM

## 2022-12-08 ENCOUNTER — Other Ambulatory Visit: Payer: Self-pay | Admitting: Family Medicine

## 2022-12-08 DIAGNOSIS — F5101 Primary insomnia: Secondary | ICD-10-CM

## 2022-12-09 ENCOUNTER — Telehealth: Payer: Self-pay

## 2022-12-09 MED ORDER — TRAZODONE HCL 100 MG PO TABS: 100.0000 mg | ORAL_TABLET | Freq: Every day | ORAL | 1 refills | Status: DC

## 2022-12-09 NOTE — Telephone Encounter (Signed)
Left message advising pt a 90 supply was sent to his pharmacy.    traZODone (DESYREL) 100 MG tablet90 tablet15/29/2024Sig - Route: Take 1 tablet (100 mg total) by mouth at bedtime. - OralSent to pharmacy as: traZODone (DESYREL) 100 MG tabletE-Prescribing Status: Receipt confirmed by pharmacy (12/09/2022  8:48 AM EDT)     Thanks,   Vernona Rieger

## 2022-12-09 NOTE — Telephone Encounter (Signed)
Copied from CRM [id number]. Topic: General - Other >> Dec 09, 2022 10:12 AM Carrielelia G wrote: Reason for CRM: can you please call patient, he would like to know why his  traZODone (DESYREL) 100 MG tablet , can not be changed to a 90 day supply PLease advise

## 2022-12-17 ENCOUNTER — Other Ambulatory Visit: Payer: Self-pay | Admitting: Family Medicine

## 2022-12-17 DIAGNOSIS — I10 Essential (primary) hypertension: Secondary | ICD-10-CM

## 2022-12-17 DIAGNOSIS — H6123 Impacted cerumen, bilateral: Secondary | ICD-10-CM | POA: Diagnosis not present

## 2022-12-17 DIAGNOSIS — D37030 Neoplasm of uncertain behavior of the parotid salivary glands: Secondary | ICD-10-CM | POA: Diagnosis not present

## 2022-12-18 NOTE — Telephone Encounter (Signed)
Requested Prescriptions  Pending Prescriptions Disp Refills   lisinopril (ZESTRIL) 40 MG tablet [Pharmacy Med Name: Lisinopril 40 MG Oral Tablet] 90 tablet 0    Sig: TAKE 1 TABLET BY MOUTH DAILY     Cardiovascular:  ACE Inhibitors Failed - 12/17/2022 11:04 PM      Failed - Cr in normal range and within 180 days    Creat  Date Value Ref Range Status  10/26/2022 1.50 (H) 0.70 - 1.28 mg/dL Final         Passed - K in normal range and within 180 days    Potassium  Date Value Ref Range Status  10/26/2022 5.2 3.5 - 5.3 mmol/L Final         Passed - Patient is not pregnant      Passed - Last BP in normal range    BP Readings from Last 1 Encounters:  11/02/22 132/70         Passed - Valid encounter within last 6 months    Recent Outpatient Visits           1 month ago Benign hypertension with CKD (chronic kidney disease) stage III [facility])   [facility] [name], DO   4 months ago Annual physical exam   [facility] [name], DO   11 months ago Essential hypertension   [facility] [name], DO   1 year ago Essential hypertension   [facility] [name], DO   1 year ago Annual physical exam   [facility] [name], DO       Future Appointments             In 4 months Althea Charon, Netta Neat, DO [facility], [facility]

## 2022-12-22 DIAGNOSIS — N1831 Chronic kidney disease, stage 3a: Secondary | ICD-10-CM | POA: Insufficient documentation

## 2022-12-22 DIAGNOSIS — G4709 Other insomnia: Secondary | ICD-10-CM | POA: Diagnosis not present

## 2022-12-22 DIAGNOSIS — R8290 Unspecified abnormal findings in urine: Secondary | ICD-10-CM | POA: Diagnosis not present

## 2022-12-22 DIAGNOSIS — N200 Calculus of kidney: Secondary | ICD-10-CM | POA: Diagnosis not present

## 2022-12-22 DIAGNOSIS — I10 Essential (primary) hypertension: Secondary | ICD-10-CM | POA: Insufficient documentation

## 2022-12-22 DIAGNOSIS — R7303 Prediabetes: Secondary | ICD-10-CM | POA: Diagnosis not present

## 2022-12-22 DIAGNOSIS — N281 Cyst of kidney, acquired: Secondary | ICD-10-CM | POA: Diagnosis not present

## 2022-12-22 DIAGNOSIS — E785 Hyperlipidemia, unspecified: Secondary | ICD-10-CM | POA: Diagnosis not present

## 2022-12-28 ENCOUNTER — Other Ambulatory Visit: Payer: Self-pay | Admitting: Nephrology

## 2022-12-28 DIAGNOSIS — N1831 Chronic kidney disease, stage 3a: Secondary | ICD-10-CM

## 2022-12-28 DIAGNOSIS — N281 Cyst of kidney, acquired: Secondary | ICD-10-CM

## 2022-12-28 DIAGNOSIS — R8290 Unspecified abnormal findings in urine: Secondary | ICD-10-CM

## 2023-01-06 ENCOUNTER — Ambulatory Visit
Admission: RE | Admit: 2023-01-06 | Discharge: 2023-01-06 | Disposition: A | Discharge status: Home / Self Care | Payer: Medicare Other | Source: Ambulatory Visit | Attending: Nephrology | Admitting: Nephrology

## 2023-01-06 DIAGNOSIS — N2889 Other specified disorders of kidney and ureter: Secondary | ICD-10-CM | POA: Diagnosis not present

## 2023-01-06 DIAGNOSIS — N281 Cyst of kidney, acquired: Secondary | ICD-10-CM | POA: Diagnosis not present

## 2023-01-06 DIAGNOSIS — N1831 Chronic kidney disease, stage 3a: Secondary | ICD-10-CM | POA: Diagnosis not present

## 2023-01-06 DIAGNOSIS — R8290 Unspecified abnormal findings in urine: Secondary | ICD-10-CM | POA: Diagnosis not present

## 2023-01-06 DIAGNOSIS — N2882 Megaloureter: Secondary | ICD-10-CM | POA: Diagnosis not present

## 2023-01-06 DIAGNOSIS — N1330 Unspecified hydronephrosis: Secondary | ICD-10-CM | POA: Diagnosis not present

## 2023-01-15 ENCOUNTER — Other Ambulatory Visit: Payer: Self-pay

## 2023-01-15 ENCOUNTER — Encounter: Payer: Medicare Other | Admitting: Urology

## 2023-01-15 ENCOUNTER — Ambulatory Visit: Payer: Medicare Other | Admitting: Urology

## 2023-01-15 ENCOUNTER — Encounter: Payer: Self-pay | Admitting: Urology

## 2023-01-15 ENCOUNTER — Other Ambulatory Visit
Admission: RE | Admit: 2023-01-15 | Discharge: 2023-01-15 | Disposition: A | Discharge status: Home / Self Care | Payer: Medicare Other | Attending: Urology | Admitting: Urology

## 2023-01-15 VITALS — BP 116/73 | HR 86 | Ht 71.0 in | Wt 189.2 lb

## 2023-01-15 DIAGNOSIS — R10A0 Flank pain, unspecified side: Secondary | ICD-10-CM

## 2023-01-15 DIAGNOSIS — N1339 Other hydronephrosis: Secondary | ICD-10-CM | POA: Diagnosis not present

## 2023-01-15 DIAGNOSIS — R109 Unspecified abdominal pain: Secondary | ICD-10-CM | POA: Insufficient documentation

## 2023-01-15 LAB — URINALYSIS, COMPLETE (UACMP) WITH MICROSCOPIC
Bacteria, UA: NONE SEEN
Bilirubin Urine: NEGATIVE
Glucose, UA: NEGATIVE mg/dL
Hgb urine dipstick: NEGATIVE
Ketones, ur: NEGATIVE mg/dL
Leukocytes,Ua: NEGATIVE
Nitrite: NEGATIVE
Protein, ur: NEGATIVE mg/dL
RBC / HPF: NONE SEEN RBC/hpf (ref 0–5)
Specific Gravity, Urine: 1.015 (ref 1.005–1.030)
Squamous Epithelial / HPF: NONE SEEN /HPF (ref 0–5)
WBC, UA: NONE SEEN WBC/hpf (ref 0–5)
pH: 5.5 (ref 5.0–8.0)

## 2023-01-18 NOTE — Progress Notes (Signed)
I, Jeff Ryan,acting as a scribe for Jeff Scotland, MD.,have documented all relevant documentation on the behalf of Jeff Scotland, MD,as directed by  Jeff Scotland, MD while in the presence of Jeff Scotland, MD.   01/15/23 6:57 PM   Jeff Ryan [birth year] [id number]  Referring provider: [name], DO [address] [place],  [place] [address]  Chief Complaint  Patient presents with   Hydronephrosis    HPI: 77 year-old male who presents for further evaluation of incidental left hydronephrosis.   Seen and evaluated by Dr. Suezanne Jacquet nephrologist for rising creatinine. His creatinine was normal in 2020 at 0.8 but slowly began to rise now up to 1.24 12/2022. Prior to this workup he underwent a renal ultrasound that showed marked chronic left hydronephrosis, renal parenchymal thinning, no ureteral jet was seen on the side. He also has dilated proximal ureter. His urinalysis has been negative. He has no previous imaging for comparison.  He has a history of kidney stones since his early 32s, with the most significant episode occurring approximately 25 years ago when he passed a large stone. He has been taking Kidney Cop for the past three years to prevent stone formation. He has no history of seeing a urologist for his kidney stones, except for the episode 25-30 years ago. He quit smoking 17 years ago after smoking since age 49. Smoked 2-3 cigarettes per day, more when not traveling. He denies gross hematuria, experiencing pain, or having unexplained weight loss. He has been managing his diet due to stage 3 kidney failure and borderline diabetes.   PMH: Past Medical History:  Diagnosis Date   Dyslipidemia    Essential hypertension     Surgical History: Past Surgical History:  Procedure Laterality Date   CATARACT EXTRACTION, BILATERAL     left 10/26/2014 right 10/12/2014   TONSILLECTOMY AND ADENOIDECTOMY     US ECHOCARDIOGRAPHY     10/2014   VASECTOMY      Home  Medications:  Allergies as of 01/15/2023   No Known Allergies      Medication List        Accurate as of January 15, 2023 11:59 PM. If you have any questions, ask your nurse or doctor.          lisinopril 40 MG tablet Commonly known as: ZESTRIL TAKE 1 TABLET BY MOUTH DAILY   Omega 3 1000 MG Caps Take 1 capsule (1,000 mg total) by mouth 3 (three) times daily.   OVER THE COUNTER MEDICATION Take 2 tablets by mouth 2 (two) times daily with a meal. Kidney Cop (Calcium Oxalate Protector)   simvastatin 40 MG tablet Commonly known as: ZOCOR TAKE 1 TABLET BY MOUTH DAILY  WITH SUPPER   traZODone 100 MG tablet Commonly known as: DESYREL Take 1 tablet (100 mg total) by mouth at bedtime.        Family History: Family History  Problem Relation Age of Onset   Heart disease Brother    Heart attack Brother        x 3   Prostate cancer Neg Hx    Colon cancer Neg Hx     Social History:  reports that he quit smoking about 16 years ago. His smoking use included cigarettes. He has quit using smokeless tobacco. He reports current alcohol use. He reports that he does not use drugs.   Physical Exam: BP 116/73 (BP Location: Left Arm, Patient Position: Sitting, Cuff Size: Large)   Pulse 86   Ht  5\' 11"  (1.803 m)   Wt 189 lb 3.2 oz (85.8 kg)   BMI 26.39 kg/m   Constitutional:  Alert and oriented, No acute distress. HEENT: [place] AT, moist mucus membranes.  Trachea midline, no masses. Neurologic: Grossly intact, no focal deficits, moving all 4 extremities. Psychiatric: Normal mood and affect.  Urinalysis    Component Value Date/Time   COLORURINE YELLOW 01/15/2023 0816   APPEARANCEUR CLEAR 01/15/2023 0816   LABSPEC 1.015 01/15/2023 0816   PHURINE 5.5 01/15/2023 0816   GLUCOSEU NEGATIVE 01/15/2023 0816   HGBUR NEGATIVE 01/15/2023 0816   BILIRUBINUR NEGATIVE 01/15/2023 0816   KETONESUR NEGATIVE 01/15/2023 0816   PROTEINUR NEGATIVE 01/15/2023 0816   NITRITE NEGATIVE 01/15/2023 0816    LEUKOCYTESUR NEGATIVE 01/15/2023 0816    Lab Results  Component Value Date   BACTERIA NONE SEEN 01/15/2023    Pertinent Imaging:  Results for orders placed during the hospital encounter of 01/06/23  US RENAL  Narrative CLINICAL DATA:  Chronic kidney disease.  Stage IIIA.  Pre diabetes.  EXAM: RENAL / URINARY TRACT ULTRASOUND COMPLETE  COMPARISON:  CT on 07/23/2007  FINDINGS: Right Kidney:  Renal measurements: 13.1 x 5.8 x 5.7 centimeters = volume: 229.2 mL. Echogenicity within normal limits. No mass or hydronephrosis visualized.  Left Kidney:  Renal measurements: 17.0 x 9.1 x 5.7 centimeters = volume: 457.9 mL. There is diffuse renal parenchymal thinning. There is marked hydronephrosis with marked dilatation of the renal pelvis and. A midpole renal cyst is 3.3 centimeters. No suspicious renal mass.  Bladder:  Appears normal for degree of bladder distention. Normal RIGHT ureteral jet. LEFT ureteral jet not detected. Prevoid volume is 254 ml. Postvoid volume is 33 ml.  Other:  Incidental note is made a RIGHT liver cysts measuring 1.5 centimeters.  IMPRESSION: 1. Normal appearance of the RIGHT kidney and urinary bladder. 2. Marked chronic LEFT hydronephrosis and significant renal parenchymal thinning. There is dilatation of the proximal LEFT ureter.   Electronically Signed By: Norva Pavlov M.D. On: 01/06/2023 10:48  Personally reviewed and agree with radiologic interpretation.   Assessment & Plan:    Incidentally symptomatic hydronephrosis, left sided -Personally reviewed renal ultrasound study.  -Differential diagnosis includes stones vs malignancy vs stricture. -Will order CT urogram to further evaluate. He does have risk factors for malignancy in the setting of smoking history.  Return in about 2 weeks (around 01/29/2023) for CT urogram results.  I have reviewed the above documentation for accuracy and completeness, and I agree with the above.    Jeff Scotland, MD    [facility] [address], [address] [place], [place] [address] [phone]

## 2023-01-27 ENCOUNTER — Ambulatory Visit
Admission: RE | Admit: 2023-01-27 | Discharge: 2023-01-27 | Disposition: A | Discharge status: Home / Self Care | Payer: Medicare Other | Source: Ambulatory Visit | Attending: Urology | Admitting: Urology

## 2023-01-27 DIAGNOSIS — N1339 Other hydronephrosis: Secondary | ICD-10-CM | POA: Diagnosis not present

## 2023-01-27 DIAGNOSIS — N132 Hydronephrosis with renal and ureteral calculous obstruction: Secondary | ICD-10-CM | POA: Diagnosis not present

## 2023-01-27 DIAGNOSIS — N281 Cyst of kidney, acquired: Secondary | ICD-10-CM | POA: Diagnosis not present

## 2023-01-27 DIAGNOSIS — Q630 Accessory kidney: Secondary | ICD-10-CM | POA: Diagnosis not present

## 2023-01-27 MED ORDER — SODIUM CHLORIDE 0.9 % IV SOLN: INTRAVENOUS | Status: DC

## 2023-01-27 MED ORDER — IOHEXOL 300 MG/ML  SOLN
125.0000 mL | Freq: Once | INTRAMUSCULAR | Status: AC | PRN
  Administered 2023-01-27: 100 mL via INTRAVENOUS

## 2023-01-29 ENCOUNTER — Ambulatory Visit: Payer: Medicare Other | Admitting: Urology

## 2023-01-29 ENCOUNTER — Encounter: Payer: Self-pay | Admitting: Urology

## 2023-01-29 VITALS — BP 116/75 | HR 75 | Ht 71.0 in | Wt 189.6 lb

## 2023-01-29 DIAGNOSIS — N201 Calculus of ureter: Secondary | ICD-10-CM | POA: Diagnosis not present

## 2023-01-29 DIAGNOSIS — N1330 Unspecified hydronephrosis: Secondary | ICD-10-CM | POA: Diagnosis not present

## 2023-01-29 DIAGNOSIS — K439 Ventral hernia without obstruction or gangrene: Secondary | ICD-10-CM

## 2023-01-29 DIAGNOSIS — N261 Atrophy of kidney (terminal): Secondary | ICD-10-CM | POA: Diagnosis not present

## 2023-02-01 NOTE — Progress Notes (Signed)
I, [name],acting as a scribe for [name], MD.,have documented all relevant documentation on the behalf of [name], MD,as directed by  [name], MD while in the presence of [name], MD.   01/29/23 7:46 PM   Jeff Ryan Apr 16, 1946 [id number]  Referring provider: [name], DO [address] [place],  [place] [address]  Chief Complaint  Patient presents with   Results    CT scan     HPI: 77 year-old male who returns today for follow-up CT urogram.   He initially was seen and evaluated for elevated/rising PSA after renal ultrasound demonstrated severe chronic left hydronephrosis of unclear etiology. He underwent CT urogram of which the final read is pending. However, he has a severely atrophic left kidney with severe hydronephrosis and hydroureter to the level of the UVJ where he has a large, 2.2 centimeter stone, 1670 hounsfield units. I personally reviewed that study today awaiting final radiologic interpretation.   Reports occasional back pain, especially during long drives. He states that his hernia is mainly bothersome when he develops gas.    PMH: Past Medical History:  Diagnosis Date   Dyslipidemia    Essential hypertension     Surgical History: Past Surgical History:  Procedure Laterality Date   CATARACT EXTRACTION, BILATERAL     left 10/26/2014 right 10/12/2014   TONSILLECTOMY AND ADENOIDECTOMY     US ECHOCARDIOGRAPHY     10/2014   VASECTOMY      Home Medications:  Allergies as of 01/29/2023   No Known Allergies      Medication List        Accurate as of January 29, 2023 11:59 PM. If you have any questions, ask your nurse or doctor.          lisinopril 40 MG tablet Commonly known as: ZESTRIL TAKE 1 TABLET BY MOUTH DAILY   Omega 3 1000 MG Caps Take 1 capsule (1,000 mg total) by mouth 3 (three) times daily.   OVER THE COUNTER MEDICATION Take 2 tablets by mouth 2 (two) times daily with a meal. Kidney Cop  (Calcium Oxalate Protector)   simvastatin 40 MG tablet Commonly known as: ZOCOR TAKE 1 TABLET BY MOUTH DAILY  WITH SUPPER   traZODone 100 MG tablet Commonly known as: DESYREL Take 1 tablet (100 mg total) by mouth at bedtime.        Family History: Family History  Problem Relation Age of Onset   Heart disease Brother    Heart attack Brother        x 3   Prostate cancer Neg Hx    Colon cancer Neg Hx     Social History:  reports that he quit smoking about 16 years ago. His smoking use included cigarettes. He has quit using smokeless tobacco. He reports current alcohol use. He reports that he does not use drugs.   Physical Exam: BP 116/75 (BP Location: Left Arm, Patient Position: Sitting, Cuff Size: Normal)   Pulse 75   Ht 5\' 11"  (1.803 m)   Wt 189 lb 9.6 oz (86 kg)   BMI 26.44 kg/m   Constitutional:  Alert and oriented, No acute distress. HEENT: [place] AT, moist mucus membranes.  Trachea midline, no masses. Neurologic: Grossly intact, no focal deficits, moving all 4 extremities. Psychiatric: Normal mood and affect.  Pertinent Imaging:  He underwent CT urogram of which the final read is pending. However, he has a severely atrophic left kidney with severe hydronephrosis and hydroureter  in the UVJ where he has a large, 2.2 centimeter stone, 1670 hounsfield units. Personally reviewed that study today awaiting final radiologic interpretation.    Assessment & Plan:    Severe left hydronephrosis/atrophy -Based on the appearance on CT scan there's little to no function, question the benefit of treating. Could consider radical nephrectomy to remove kidney is a nidus for infection and pain, alternatively manage conservatively with no intervention although could cause issues down the road. Lastly we could treat the stone which would relieve the obstruction but not likely improving overall renal function and it's a fairly significant stone burden would be somewhat difficult. Could also  consider nephrotomy and open nephroureterotomy at the time of surgery.   Obstructing left ureteral calculus  Abdominal wall hernia -Sometimes bothersome to him. He wonders if we can get this all done at once. We'll decide whether or not to proceed with hand assist versus robotic depending on port placement and whether or not we'll plan on fixing his hernia, concomitant. We'll coordinate with general surgeon regarding the recommendations. We'll likely plan to leave the stone behind unless it's easily accessible at the time of surgery, it will not likely be an issue. In terms of nidus for infection as it won't be in continuity with the collecting system any longer. -Approximately like four or five centimeters x about three centimeters.Superior into the right of his umbilicus.  -We'll have him referral to general surgery.  Return for refer to general surgeon.   [facility] [address] [place], [place] [address] [phone]

## 2023-02-02 ENCOUNTER — Other Ambulatory Visit: Payer: Self-pay

## 2023-02-02 DIAGNOSIS — N261 Atrophy of kidney (terminal): Secondary | ICD-10-CM

## 2023-02-02 DIAGNOSIS — N1330 Unspecified hydronephrosis: Secondary | ICD-10-CM

## 2023-02-02 NOTE — Progress Notes (Signed)
Surgical Physician Order Form [facility]  * Scheduling expectation : Next Available  *Length of Case:   *Clearance needed: no  *Anticoagulation Instructions: Hold all anticoagulants  *Aspirin Instructions: Hold Aspirin  *Post-op visit Date/Instructions:  4 weeks  *Diagnosis: Severe left hydronephrosis/ renal atrophy  *Procedure: left robotic versus hand-assisted nephrectomy with possible ureterotomy/ureteral stone removal.   Additional orders: N/A  -Admit type: INpatient  -Anesthesia: General  -VTE Prophylaxis Standing Order SCD's       Other:   -Standing Lab Orders Per Anesthesia    Lab other: CBC, BMP, INR, type and screen, UA/urine culture  -Standing Test orders EKG/Chest x-ray per Anesthesia       Test other:   - Medications:  Ancef 2gm IV  -Other orders: Coordinating with general surgery for possible hernia repair- consult pending

## 2023-02-03 ENCOUNTER — Telehealth: Payer: Self-pay

## 2023-02-03 NOTE — Telephone Encounter (Signed)
Called to schedule surgery, no answer. Will try again. Did leave detailed message to return my call.

## 2023-02-05 ENCOUNTER — Telehealth: Payer: Self-pay

## 2023-02-05 NOTE — Progress Notes (Signed)
   [facility] Surgical Posting Form  Surgery Date: Date: 03/22/2023  Surgeon: Dr. [name], MD, Dr. Legrand Rams, MD  Inpt ( Yes  )   Outpt (No)   Obs ( No  )   Diagnosis: N13.30 Left Hydronephrosis, N26.1 Left Atrophic Kidney  -CPT: 02725  Surgery: Left Robotic Versus Hand Assisted Laparoscopic Radical Nephrectomy, Possible Ureterotomy/Ureteral Stone Removal  Stop Anticoagulations: Yes and hold ASA  Cardiac/Medical/Pulmonary Clearance needed: no  *Orders entered into EPIC  Date: 02/05/23   *Case booked in [place]  Date: 02/04/2023  *Notified pt of Surgery: Date: 02/04/2023  PRE-OP UA & CX: yes, and will also obtain CBC, BMP, INR, Type and Screen  *Placed into Prior Authorization Work Que Date: 02/05/23  Assistant/laser/rep:Yes Dr. Richardo Hanks to assist

## 2023-02-05 NOTE — Telephone Encounter (Signed)
I spoke with Jeff Ryan. We have discussed possible surgery dates and Monday September 9th, 2024 was agreed upon by all parties. Patient given information about surgery date, what to expect pre-operatively and post operatively.  We discussed that a Pre-Admission Testing office will be calling to set up the pre-op visit that will take place prior to surgery, and that these appointments are typically done over the phone with a Pre-Admissions RN. Informed patient that our office will communicate any additional care to be provided after surgery. Patients questions or concerns were discussed during our call. Advised to call our office should there be any additional information, questions or concerns that arise. Patient verbalized understanding.

## 2023-02-10 NOTE — Progress Notes (Unsigned)
Patient ID: Jeff Ryan, male   DOB: [birth year], 77 y.o.   MRN: [id number]  Chief Complaint: Longstanding epigastric hernia  History of Present Illness Jeff Ryan is a 77 y.o. male with a 20-year history of a supraumbilical hernia bulge.  Denies any significant symptoms or pain.  Has been retired for multiple years.  Wife has been encouraging him to get it repaired.  Currently planning for robotic left nephrectomy for September 9.  Past Medical History Past Medical History:  Diagnosis Date   Dyslipidemia    Essential hypertension       Past Surgical History:  Procedure Laterality Date   CATARACT EXTRACTION, BILATERAL     left 10/26/2014 right 10/12/2014   TONSILLECTOMY AND ADENOIDECTOMY     US ECHOCARDIOGRAPHY     10/2014   VASECTOMY      No Known Allergies  Current Outpatient Medications  Medication Sig Dispense Refill   lisinopril (ZESTRIL) 40 MG tablet TAKE 1 TABLET BY MOUTH DAILY 90 tablet 0   Omega 3 1000 MG CAPS Take 1 capsule (1,000 mg total) by mouth 3 (three) times daily. 90 each    OVER THE COUNTER MEDICATION Take 2 tablets by mouth 2 (two) times daily with a meal. Kidney Cop (Calcium Oxalate Protector)     simvastatin (ZOCOR) 40 MG tablet TAKE 1 TABLET BY MOUTH DAILY  WITH SUPPER 90 tablet 1   traZODone (DESYREL) 100 MG tablet Take 1 tablet (100 mg total) by mouth at bedtime. 90 tablet 1   No current facility-administered medications for this visit.    Family History Family History  Problem Relation Age of Onset   Heart disease Brother    Heart attack Brother        x 3   Prostate cancer Neg Hx    Colon cancer Neg Hx       Social History Social History   Tobacco Use   Smoking status: Former    Current packs/day: 0.00    Types: Cigarettes    Quit date: 01/11/2007    Years since quitting: 16.0   Smokeless tobacco: Former  Building services engineer status: Never Used  Substance Use Topics   Alcohol use: Yes    Alcohol/week: 0.0 standard drinks of  alcohol    Comment: beer x2 weekly   Drug use: No        Review of Systems  Constitutional: Negative.   HENT: Negative.    Eyes: Negative.   Respiratory: Negative.    Cardiovascular: Negative.   Genitourinary:  Positive for frequency.  Skin: Negative.   Neurological: Negative.      Physical Exam Blood pressure (!) 155/90, pulse 80, temperature 98 F (36.7 C), height 5\' 11"  (1.803 m), weight 192 lb 3.2 oz (87.2 kg), SpO2 100%. Last Weight  Most recent update: 02/11/2023  9:55 AM    Weight  87.2 kg (192 lb 3.2 oz)             CONSTITUTIONAL: Well developed, and nourished, appropriately responsive and aware without distress.   EYES: Sclera non-icteric.   EARS, NOSE, MOUTH AND THROAT:  The oropharynx is clear. Oral mucosa is pink and moist.    Hearing is intact to voice.  NECK: Trachea is midline, and there is no jugular venous distension.  LYMPH NODES:  Lymph nodes in the neck are not appreciated. RESPIRATORY:  Lungs are clear, and breath sounds are equal bilaterally.  Normal respiratory effort without pathologic use of accessory muscles. CARDIOVASCULAR: Heart  is regular in rate and rhythm.   Well perfused.  GI: The abdomen has a supraumbilical midline mass that is easily reducible to what feels like a defect likely 2 cm in diameter.  Soft, nontender, and nondistended. There were no other palpable masses.  I did not appreciate hepatosplenomegaly.  MUSCULOSKELETAL:  Symmetrical muscle tone appreciated in all four extremities.    SKIN: Skin turgor is normal. No pathologic skin lesions appreciated.  NEUROLOGIC:  Motor and sensation appear grossly normal.  Cranial nerves are grossly without defect. PSYCH:  Alert and oriented to person, place and time. Affect is appropriate for situation.  Data Reviewed I have personally reviewed what is currently available of the patient's imaging, recent labs and medical records.   Labs:     Latest Ref Rng & Units 07/20/2022    8:20 AM  06/23/2021    9:33 AM 06/17/2020    8:25 AM  CBC  WBC 3.8 - 10.8 Thousand/uL 7.2  6.7  5.7   Hemoglobin 13.2 - 17.1 g/dL 14.7  82.9  56.2   Hematocrit 38.5 - 50.0 % 45.7  45.7  40.1   Platelets 140 - 400 Thousand/uL 291  225  235       Latest Ref Rng & Units 10/26/2022    8:05 AM 07/20/2022    8:20 AM 07/21/2021    8:54 AM  CMP  Glucose 65 - 99 mg/dL 130  865  784   BUN 7 - 25 mg/dL 29  30  28    Creatinine 0.70 - 1.28 mg/dL 6.96  2.95  2.84   Sodium 135 - 146 mmol/L 131  135  137   Potassium 3.5 - 5.3 mmol/L 5.2  5.2  5.1   Chloride 98 - 110 mmol/L 92  97  99   CO2 20 - 32 mmol/L 28  31  30    Calcium 8.6 - 10.3 mg/dL 9.8  13.2  9.7   Total Protein 6.1 - 8.1 g/dL  7.6    Total Bilirubin 0.2 - 1.2 mg/dL  0.6    AST 10 - 35 U/L  22    ALT 9 - 46 U/L  21        Imaging: Radiological images reviewed:   Within last 24 hrs: No results found.  Assessment    Longstanding epigastric hernia, less than 3 cm diameter. Patient Active Problem List   Diagnosis Date Noted   History of nephrolithiasis 05/08/2019   Overweight (BMI 25.0-29.9) 04/04/2018   Former smoker 03/02/2017   Screening for colon cancer 03/02/2017   Elevated blood sugar 08/20/2016   Benign hypertension with CKD (chronic kidney disease) stage III (HCC) 04/15/2015   Hyperlipidemia 04/15/2015    Plan    Will coordinate left nephrectomy with Dr. [name], and assist with repair of this anterior abdominal wall hernia.  Unlikely that mesh will be necessary.  Perhaps could utilize the epigastric defect for extraction of the specimen.  Would anticipate primary suture repair, and closure.  Hernia risk discussed with patient.  Potential for recurrence, anesthetic risks, infection, mesh issues (although I do not anticipate utilization of mesh in this circumstance) bleeding etc.   Face-to-face time spent with the patient and accompanying care providers(if present) was 30 minutes, with more than 50% of the time spent  counseling, educating, and coordinating care of the patient.    These notes generated with voice recognition software. I apologize for typographical errors.  Campbell Lerner M.D., FACS 02/11/2023, 10:22 AM

## 2023-02-11 ENCOUNTER — Encounter: Payer: Self-pay | Admitting: Surgery

## 2023-02-11 ENCOUNTER — Ambulatory Visit: Payer: Self-pay | Admitting: Surgery

## 2023-02-11 ENCOUNTER — Ambulatory Visit: Payer: Medicare Other | Admitting: Surgery

## 2023-02-11 VITALS — BP 155/90 | HR 80 | Temp 98.0°F | Ht 71.0 in | Wt 192.2 lb

## 2023-02-11 DIAGNOSIS — K439 Ventral hernia without obstruction or gangrene: Secondary | ICD-10-CM | POA: Insufficient documentation

## 2023-02-11 NOTE — Patient Instructions (Signed)
Our surgery scheduler Britta Mccreedy will call you within 24-48 hours to get you scheduled. If you have not heard from her after 48 hours, please call our office. Have the blue sheet available when she calls to write down important information.   Hernia, Adult     A hernia happens when an organ or tissue inside your body pushes out through a weak spot in the muscles of your belly (abdomen). This makes a bulge. The bulge may be: In a scar from a surgery that was done in your belly (incisional hernia). Near your belly button (umbilical hernia). In your groin (inguinal hernia). Your groin is the area where your leg meets your lower belly. If you are a male, this type could also be in your scrotum. In your upper thigh (femoral hernia). Inside your belly (hiatal hernia). This happens when your stomach slides above the muscle between your belly and your chest (diaphragm). What are the causes? This condition may be caused by: Lifting heavy things. Coughing over a long period of time. Having trouble pooping (constipation). Trouble pooping can lead to straining. A cut from surgery in your belly. A physical problem that is present at birth. Being very overweight. Smoking. Too much fluid in your belly. A testicle that has not moved down into the scrotum, in males. What are the signs or symptoms? The main symptom is a bulge in the area of the hernia, but a bulge may not always be seen. It may grow bigger or be easier to see when you cough or strain (such as when lifting something heavy). A hernia that can be pushed back into the belly rarely causes pain. A hernia that cannot be pushed back into the belly may lose its blood supply. This may cause: Pain. Fever. A feeling like you may vomit, and vomiting. Swelling. Trouble pooping. How is this treated? A hernia that is small and painless may not need to be treated. A hernia that is large or painful may be treated with surgery. Surgery to treat a hernia  involves pushing the bulge back into place and repairing the weak area of the muscle or belly. Follow these instructions at home: Activity Avoid straining the muscles near your hernia. This can happen when you: Lift something heavy. Poop (have a bowel movement). Do not lift anything that is heavier than 10 lb (4.5 kg), or the limit that you are told. When you lift something heavy, use your leg muscles. Do not use your back muscles to lift. Prevent trouble pooping If told by your doctor, take steps to prevent trouble pooping. You may need to: Drink enough fluid to keep your pee (urine) pale yellow. Take medicines. You will be told what medicines to take. Eat foods that are high in fiber. These include beans, whole grains, and fresh fruits and vegetables. Limit foods that are high in fat and sugar. These include fried or sweet foods. General instructions When you cough, try to cough gently. You may try to push your hernia back in by gently pressing on it when you are lying down. Do not try to force the bulge back in if it will not go in easily. If you are overweight, work with your doctor to lose weight safely. Do not smoke or use any products that contain nicotine or tobacco. If you need help quitting, ask your doctor. If you will be having surgery, watch your hernia for changes in shape, size, or color. Tell your doctor if you see any changes. Take  over-the-counter and prescription medicines only as told by your doctor. Keep all follow-up visits. Contact a doctor if: You get new pain, swelling, or redness near your hernia. You poop fewer times in a week than normal. You have trouble pooping. You have poop that is more dry than normal. You have poop that is harder or larger than normal. Get help right away if: You have a fever or chills. You have belly pain that gets worse. You feel like you may vomit, or you vomit. Your hernia cannot be pushed in by gently pressing on it when you are  lying down. Your hernia: Changes in shape or size. Changes color. Feels hard, or it hurts when you touch it. These symptoms may be an emergency. Get help right away. Call your local emergency services (911 in the [place] Do not wait to see if the symptoms will go away. Do not drive yourself to the hospital. Summary A hernia happens when an organ or tissue inside your body pushes out through a weak spot in the belly muscles. This creates a bulge. If your hernia is small and it does not hurt, you may not need treatment. If your hernia is large or it hurts, you may need surgery. If you will be having surgery, watch your hernia for changes in shape, size, or color. Tell your doctor about any changes. This information is not intended to replace advice given to you by your health care provider. Make sure you discuss any questions you have with your health care provider. Document Revised: 02/05/2020 Document Reviewed: 02/05/2020 Elsevier Patient Education  2024 [employer].

## 2023-02-15 DIAGNOSIS — N1831 Chronic kidney disease, stage 3a: Secondary | ICD-10-CM | POA: Diagnosis not present

## 2023-02-15 DIAGNOSIS — E785 Hyperlipidemia, unspecified: Secondary | ICD-10-CM | POA: Diagnosis not present

## 2023-02-15 DIAGNOSIS — N200 Calculus of kidney: Secondary | ICD-10-CM | POA: Diagnosis not present

## 2023-02-15 DIAGNOSIS — R8290 Unspecified abnormal findings in urine: Secondary | ICD-10-CM | POA: Diagnosis not present

## 2023-02-15 DIAGNOSIS — I10 Essential (primary) hypertension: Secondary | ICD-10-CM | POA: Diagnosis not present

## 2023-02-23 DIAGNOSIS — G4709 Other insomnia: Secondary | ICD-10-CM | POA: Diagnosis not present

## 2023-02-23 DIAGNOSIS — N261 Atrophy of kidney (terminal): Secondary | ICD-10-CM | POA: Diagnosis not present

## 2023-02-23 DIAGNOSIS — N281 Cyst of kidney, acquired: Secondary | ICD-10-CM | POA: Diagnosis not present

## 2023-02-23 DIAGNOSIS — R8290 Unspecified abnormal findings in urine: Secondary | ICD-10-CM | POA: Diagnosis not present

## 2023-02-23 DIAGNOSIS — R7303 Prediabetes: Secondary | ICD-10-CM | POA: Diagnosis not present

## 2023-02-23 DIAGNOSIS — R3129 Other microscopic hematuria: Secondary | ICD-10-CM | POA: Diagnosis not present

## 2023-02-23 DIAGNOSIS — N1831 Chronic kidney disease, stage 3a: Secondary | ICD-10-CM | POA: Diagnosis not present

## 2023-02-23 DIAGNOSIS — I10 Essential (primary) hypertension: Secondary | ICD-10-CM | POA: Diagnosis not present

## 2023-02-23 DIAGNOSIS — N200 Calculus of kidney: Secondary | ICD-10-CM | POA: Diagnosis not present

## 2023-02-23 DIAGNOSIS — E785 Hyperlipidemia, unspecified: Secondary | ICD-10-CM | POA: Diagnosis not present

## 2023-02-23 DIAGNOSIS — N1330 Unspecified hydronephrosis: Secondary | ICD-10-CM | POA: Diagnosis not present

## 2023-02-25 ENCOUNTER — Other Ambulatory Visit: Payer: Self-pay | Admitting: Family Medicine

## 2023-02-25 DIAGNOSIS — I10 Essential (primary) hypertension: Secondary | ICD-10-CM

## 2023-02-26 NOTE — Telephone Encounter (Signed)
Rx 12/18/22 #90- too soon Requested Prescriptions  Pending Prescriptions Disp Refills   lisinopril (ZESTRIL) 40 MG tablet [Pharmacy Med Name: Lisinopril 40 MG Oral Tablet] 90 tablet 3    Sig: TAKE 1 TABLET BY MOUTH DAILY     Cardiovascular:  ACE Inhibitors Failed - 02/25/2023 10:57 PM      Failed - Cr in normal range and within 180 days    Creat  Date Value Ref Range Status  10/26/2022 1.50 (H) 0.70 - 1.28 mg/dL Final         Failed - Last BP in normal range    BP Readings from Last 1 Encounters:  02/11/23 (!) 155/90         Passed - K in normal range and within 180 days    Potassium  Date Value Ref Range Status  10/26/2022 5.2 3.5 - 5.3 mmol/L Final         Passed - Patient is not pregnant      Passed - Valid encounter within last 6 months    Recent Outpatient Visits           3 months ago Benign hypertension with CKD (chronic kidney disease) stage III [facility])   [place] [facility] [name], DO   7 months ago Annual physical exam   [place] [facility] [name], DO   1 year ago Essential hypertension   [place] [facility] [name], DO   1 year ago Essential hypertension   [place] [facility] [name], DO   1 year ago Annual physical exam   [place] [facility] [name], DO       Future Appointments             In 2 months Althea Charon, Netta Neat, DO  [facility], [facility]

## 2023-03-10 ENCOUNTER — Other Ambulatory Visit: Payer: Self-pay

## 2023-03-10 ENCOUNTER — Other Ambulatory Visit: Payer: Self-pay | Admitting: Family Medicine

## 2023-03-10 ENCOUNTER — Other Ambulatory Visit (HOSPITAL_COMMUNITY): Payer: Self-pay

## 2023-03-10 DIAGNOSIS — I10 Essential (primary) hypertension: Secondary | ICD-10-CM

## 2023-03-10 MED ORDER — LISINOPRIL 40 MG PO TABS
40.0000 mg | ORAL_TABLET | Freq: Every day | ORAL | 0 refills | Status: DC
Start: 2023-03-10 — End: 2023-08-12
  Filled 2023-03-10: qty 90, 90d supply, fill #0

## 2023-03-12 ENCOUNTER — Other Ambulatory Visit: Payer: Medicare Other

## 2023-03-26 ENCOUNTER — Encounter
Admission: RE | Admit: 2023-03-26 | Discharge: 2023-03-26 | Disposition: A | Discharge status: Home / Self Care | Payer: Medicare Other | Source: Ambulatory Visit | Attending: Urology

## 2023-03-26 ENCOUNTER — Other Ambulatory Visit: Payer: Self-pay

## 2023-03-26 DIAGNOSIS — E782 Mixed hyperlipidemia: Secondary | ICD-10-CM | POA: Diagnosis not present

## 2023-03-26 DIAGNOSIS — R8290 Unspecified abnormal findings in urine: Secondary | ICD-10-CM | POA: Insufficient documentation

## 2023-03-26 DIAGNOSIS — N183 Chronic kidney disease, stage 3 unspecified: Secondary | ICD-10-CM

## 2023-03-26 DIAGNOSIS — I4891 Unspecified atrial fibrillation: Secondary | ICD-10-CM

## 2023-03-26 DIAGNOSIS — I129 Hypertensive chronic kidney disease with stage 1 through stage 4 chronic kidney disease, or unspecified chronic kidney disease: Secondary | ICD-10-CM | POA: Insufficient documentation

## 2023-03-26 DIAGNOSIS — Z01818 Encounter for other preprocedural examination: Secondary | ICD-10-CM | POA: Diagnosis not present

## 2023-03-26 DIAGNOSIS — N1330 Unspecified hydronephrosis: Secondary | ICD-10-CM | POA: Diagnosis not present

## 2023-03-26 DIAGNOSIS — N261 Atrophy of kidney (terminal): Secondary | ICD-10-CM

## 2023-03-26 DIAGNOSIS — Z01810 Encounter for preprocedural cardiovascular examination: Secondary | ICD-10-CM

## 2023-03-26 HISTORY — DX: Calculus of kidney: N20.0

## 2023-03-26 HISTORY — DX: Insomnia, unspecified: G47.00

## 2023-03-26 HISTORY — DX: Atherosclerosis of aorta: I70.0

## 2023-03-26 HISTORY — DX: Umbilical hernia without obstruction or gangrene: K42.9

## 2023-03-26 HISTORY — DX: Ventral hernia without obstruction or gangrene: K43.9

## 2023-03-26 HISTORY — DX: Atrophy of kidney (terminal): N26.1

## 2023-03-26 HISTORY — DX: Unspecified atrial fibrillation: I48.91

## 2023-03-26 LAB — URINALYSIS, COMPLETE (UACMP) WITH MICROSCOPIC
Bacteria, UA: NONE SEEN
Bilirubin Urine: NEGATIVE
Glucose, UA: NEGATIVE mg/dL
Hgb urine dipstick: NEGATIVE
Ketones, ur: NEGATIVE mg/dL
Leukocytes,Ua: NEGATIVE
Nitrite: NEGATIVE
Protein, ur: NEGATIVE mg/dL
Specific Gravity, Urine: 1.011 (ref 1.005–1.030)
Squamous Epithelial / HPF: 0 /HPF (ref 0–5)
pH: 5 (ref 5.0–8.0)

## 2023-03-26 LAB — PROTIME-INR
INR: 1.1 (ref 0.8–1.2)
Prothrombin Time: 14.2 s (ref 11.4–15.2)

## 2023-03-26 NOTE — Progress Notes (Signed)
  Perioperative Services Pre-Admission/Anesthesia Testing    Date: 03/26/23  Name: Jeff Ryan MRN:   [id number]  Re: New onset atrial fibrillation  Planned Surgical Procedure(s):    Case: [id number] Date/Time: 04/05/23 0715   Procedures:      XI ROBOTIC VS HAND ASSISTED LAPAROSCOPIC NEPHRECTOMY (Left)     HERNIA REPAIR EPIGASTRIC ADULT   Anesthesia type: General   Pre-op diagnosis:      Severe Left Hydronephrosis, Left Renal Atrophy     open epigastric hernia repair   Location: [facility] OR ROOM 07 / [facility] ORS FOR ANESTHESIA GROUP   Surgeons: [name], MD; Campbell Lerner, MD      Clinical Notes:  Patient is scheduled for the above procedure on 04/05/2023 with Dr. [name], MD and Dr. Campbell Lerner, MD.  In preparation for his procedure, patient presented to the PAT clinic on the afternoon of 03/26/2023 for preoperative labs and ECG.  In review of his ECG, patient noted to be in new onset atrial fibrillation with slow ventricular response at a rate of 59 bpm.  There were no acute ST or T wave abnormalities.  Evidence of a potential age undetermined septal infarct present.   Prior ECG from 07/20/2007 revealed NSR at a rate of 74 bpm with no evidence of ST or T wave changes.  Possibility of an age undetermined anterior infarct noted.  Patient denies history of known cardiovascular diagnoses.  He has never been diagnosed with any dysrhythmias in the past.  Patient not experiencing any significant symptoms at the time he was in the PAT clinic.  Patient specifically denied chest pain, shortness breath, palpitations, fatigue, weakness, vertiginous symptoms, or feelings of presyncope/syncope.  Patient able to complete all of his ADLs/IADLs independently without significant cardiovascular limitation.  ECG:    Impression and Plan:  Jeff Ryan scheduled for Procedure with general surgery (epigastric hernia repair) and urology (LEFT nephrectomy) on 04/05/2023.  Patient  found to be in new onset atrial fibrillation with SVR at a rate of 59 bpm on preoperative ECG.  Patient being referred to cardiology for further evaluation and clearance prior to upcoming procedures.  Patient was advised that he would need to be seen by cardiology prior to upcoming surgery.  Patient requesting [facility] provider; referral sent.  Patient was advised that depending on clinic availability, and any potential testing deemed necessary, surgeries that are scheduled for 04/04/2021 for may have to be postponed.  Patient was made aware that every effort will be made to get patient seen and cleared by cardiology service line prior to scheduled surgery date.  He was advised to expect to hear from cardiology office in the near future regarding appointment availability.  Copy of note sent to patient's primary attending surgeons [name], MD and [name], MD) to make them aware of acute ECG changes and plans for preoperative evaluation and clearance.  No changes are being made to the OR schedule at this time.  [name], MSN, APRN, FNP-C, CEN [facility]  Perioperative Services Nurse Practitioner Phone: [phone] Fax: [phone] 03/26/23 2:38 PM  NOTE: This note has been prepared using Dragon dictation software. Despite my best ability to proofread, there is always the potential that unintentional transcriptional errors may still occur from this process.

## 2023-03-26 NOTE — Patient Instructions (Addendum)
Your procedure is scheduled on: Monday 04/05/23 To find out your arrival time, please call [phone] between 1PM - 3PM on:   Friday 04/02/23 Report to the Registration Desk on the 1st floor of the [facility]. FREE Valet parking is available.  If your arrival time is 6:00 am, do not arrive before that time as the [facility] entrance doors do not open until 6:00 am.  REMEMBER: Instructions that are not followed completely may result in serious medical risk, up to and including death; or upon the discretion of your surgeon and anesthesiologist your surgery may need to be rescheduled.  Do not eat food or drink any liquids after midnight the night before surgery.  No gum chewing or hard candies.  One week prior to surgery: Stop Anti-inflammatories (NSAIDS) such as Advil, Aleve, Ibuprofen, Motrin, Naproxen, Naprosyn and Aspirin based products such as Excedrin, Goody's Powder, BC Powder. You may however, continue to take Tylenol if needed for pain up until the day of surgery.  Stop ANY OVER THE COUNTER supplements until after surgery.  Continue taking all prescribed medications.   TAKE ONLY THESE MEDICATIONS THE MORNING OF SURGERY WITH A SIP OF WATER:  none  No Alcohol for 24 hours before or after surgery.  No Smoking including e-cigarettes for 24 hours before surgery.  No chewable tobacco products for at least 6 hours before surgery.  No nicotine patches on the day of surgery.  Do not use any "recreational" drugs for at least a week (preferably 2 weeks) before your surgery.  Please be advised that the combination of cocaine and anesthesia may have negative outcomes, up to and including death. If you test positive for cocaine, your surgery will be cancelled.  On the morning of surgery brush your teeth with toothpaste and water, you may rinse your mouth with mouthwash if you wish. Do not swallow any toothpaste or mouthwash.  Use CHG Soap or wipes as directed on instruction  sheet.  Do not wear lotions, powders, or perfumes.   Do not shave body hair from the neck down 48 hours before surgery.  Wear comfortable clothing (specific to your surgery type) to the hospital.  Do not wear jewelry, make-up, hairpins, clips or nail polish.  Contact lenses, hearing aids and dentures may not be worn into surgery. Bring case for glasses  Do not bring valuables to the hospital. [facility] is not responsible for any missing/lost belongings or valuables.   Notify your doctor if there is any change in your medical condition (cold, fever, infection).  If you are being discharged the day of surgery, you will not be allowed to drive home. You will need a responsible individual to drive you home and stay with you for 24 hours after surgery.   If you are taking public transportation, you will need to have a responsible individual with you.  If you are being admitted to the hospital overnight, leave your suitcase in the car. After surgery it may be brought to your room.  In case of increased patient census, it may be necessary for you, the patient, to continue your postoperative care in the Same Day Surgery department.  After surgery, you can help prevent lung complications by doing breathing exercises.  Take deep breaths and cough every 1-2 hours. Your doctor may order a device called an Incentive Spirometer to help you take deep breaths. When coughing or sneezing, hold a pillow firmly against your incision with both hands. This is called "splinting." Doing this  helps protect your incision. It also decreases belly discomfort.  Surgery Visitation Policy:  Patients undergoing a surgery or procedure may have two family members or support persons with them as long as the person is not COVID-19 positive or experiencing its symptoms.   Inpatient Visitation:    Visiting hours are 7 a.m. to 8 p.m. Up to four visitors are allowed at one time in a patient room. The visitors may  rotate out with other people during the day. One designated support person (adult) may remain overnight.  Please call the Pre-admissions Testing Dept. at [phone] if you have any questions about these instructions.     Preparing for Surgery with CHLORHEXIDINE GLUCONATE (CHG) Soap  Chlorhexidine Gluconate (CHG) Soap  o An antiseptic cleaner that kills germs and bonds with the skin to continue killing germs even after washing  o Used for showering the night before surgery and morning of surgery  Before surgery, you can play an important role by reducing the number of germs on your skin.  CHG (Chlorhexidine gluconate) soap is an antiseptic cleanser which kills germs and bonds with the skin to continue killing germs even after washing.  Please do not use if you have an allergy to CHG or antibacterial soaps. If your skin becomes reddened/irritated stop using the CHG.  1. Shower the NIGHT BEFORE SURGERY and the MORNING OF SURGERY with CHG soap.  2. If you choose to wash your hair, wash your hair first as usual with your normal shampoo.  3. After shampooing, rinse your hair and body thoroughly to remove the shampoo.  4. Use CHG as you would any other liquid soap. You can apply CHG directly to the skin and wash gently with a scrungie or a clean washcloth.  5. Apply the CHG soap to your body only from the neck down. Do not use on open wounds or open sores. Avoid contact with your eyes, ears, mouth, and genitals (private parts). Wash face and genitals (private parts) with your normal soap.  6. Wash thoroughly, paying special attention to the area where your surgery will be performed.  7. Thoroughly rinse your body with warm water.  8. Do not shower/wash with your normal soap after using and rinsing off the CHG soap.  9. Pat yourself dry with a clean towel.  10. Wear clean pajamas to bed the night before surgery.  12. Place clean sheets on your bed the night of your first shower  and do not sleep with pets.  13. Shower again with the CHG soap on the day of surgery prior to arriving at the hospital.  14. Do not apply any deodorants/lotions/powders.  15. Please wear clean clothes to the hospital.

## 2023-03-27 LAB — URINE CULTURE: Culture: NO GROWTH

## 2023-04-01 ENCOUNTER — Encounter: Payer: Self-pay | Admitting: Urgent Care

## 2023-04-01 ENCOUNTER — Ambulatory Visit: Payer: Self-pay | Admitting: Surgery

## 2023-04-01 DIAGNOSIS — Z7901 Long term (current) use of anticoagulants: Secondary | ICD-10-CM

## 2023-04-01 DIAGNOSIS — R011 Cardiac murmur, unspecified: Secondary | ICD-10-CM

## 2023-04-01 DIAGNOSIS — I10 Essential (primary) hypertension: Secondary | ICD-10-CM | POA: Diagnosis not present

## 2023-04-01 DIAGNOSIS — I4891 Unspecified atrial fibrillation: Secondary | ICD-10-CM | POA: Diagnosis not present

## 2023-04-01 HISTORY — DX: Cardiac murmur, unspecified: R01.1

## 2023-04-01 HISTORY — DX: Long term (current) use of anticoagulants: Z79.01

## 2023-04-01 NOTE — Progress Notes (Signed)
Perioperative Services Pre-Admission/Anesthesia Testing    Date: 04/01/23  Name: Jeff Ryan MRN:   [id number]  Re: Clearance for surgery  Planned Surgical Procedure(s):    Case: [id number] Date/Time: 04/05/23 0715   Procedures:      XI ROBOTIC VS HAND ASSISTED LAPAROSCOPIC NEPHRECTOMY (Left)     HERNIA REPAIR EPIGASTRIC ADULT   Anesthesia type: General   Pre-op diagnosis:      Severe Left Hydronephrosis, Left Renal Atrophy     open epigastric hernia repair   Location: [facility] OR ROOM 07 / [facility]   Surgeons: [name], MD; Campbell Lerner, MD      Clinical Notes:  Patient is scheduled for the above procedure on 04/05/2023 with Dr. [name], MD and Dr. Campbell Lerner, MD.  In preparation for patient's procedure, patient presented to the PAT clinic on 03/26/2023 for preoperative labs and ECG.  In review of his preoperative ECG, patient to be in atrial fibrillation with slow ventricular response at a rate of 59 bpm.  When compared to previously obtained tracing, this was a new finding.  CHA2DS2-VASc Score = 4 (age x 2, HTN, vascular disease history). Patient was referred to cardiology for further evaluation.  Patient was seen and outpatient consult by cardiology Jeff Sing, MD) on 04/01/2023; notes reviewed.  Patient denied any significant cardiovascular history.  He had no complaints of chest pain, shortness breath, PND, orthopnea, palpitations, significant peripheral edema, weakness, fatigue, vertiginous symptoms, or presyncope/syncope.  Blood pressure was elevated in the office, however patient advised cardiologist that SBP runs in the 120 range at home on prescribed ACEi monotherapy (lisinopril).  Patient was taking simvastatin for his HLD diagnosis and further ASCVD prevention.  Patient not diabetic. Patient does not have an OSAH diagnosis.    Patient active per baseline.  He is able to complete all of his ADLs/ADLs independently without significant  cardiovascular limitation.  Per the [facility]AS[facility], patient able to achieve >4 METS of physical activity without experiencing any degree of significant angina/anginal equivalent symptoms.  In the setting of new onset atrial fibrillation and upcoming surgery, cardiologist sent patient for TTE.   TTE performed today (04/01/2023) revealed a low normal left ventricular systolic function with an EF of 50-55%.  There were no regional wall motion abnormalities.  Right ventricular size and function normal.  There were no significant valvular abnormalities.  All transvalvular gradients were noted to be normal providing no evidence suggestive of valvular stenosis.  Aorta was normal in size with no evidence of aneurysmal dilatation.  Exam was overall unremarkable with the exception of a grade 2/6 systolic murmur noted in the mitral area.  Patient euvolemic.  Decision was made to start patient on low-dose beta-blocker therapy (metoprolol tartrate 25 mg twice daily) and standard dose apixaban.  Initiation of DOAC therapy to begin postoperatively.  Per cardiology, "he will be at acceptable, low to intermediate risk for surgery. No further cardiac evaluation is indicated. Optimized from cardiac standpoint". No other changes were made to his medication regimen.  Patient to follow-up with outpatient cardiology in 6 weeks or sooner if needed.  CHL updated with new diagnosis of atrial fibrillation and cardiac murmur.  Medication list updated to include new beta-blocker and DOAC medication.  Impression and Plan:  Jeff Ryan scheduled for surgery on 04/05/2023.  Newly diagnosed with atrial fibrillation.  Patient referred to cardiology.  Consult with cardiology occurred on 04/01/2023.  Patient had echo that was unremarkable; normal.  Started on beta blocker and DOAC,  with DOAC therapy to begin when cleared postoperatively by Dr. [name] and Dr. [name]. Patient has been appropriately cleared by cardiology to proceed with surgery  at an acceptable (LOW to INTERMEDIATE) risk of significant perioperative cardiovascular complications. Appreciate the expedited consult and testing by our cardiology colleagues at [facility].   Based on clinical review performed today (04/01/23), barring any significant acute changes in the patient's overall condition, it is anticipated that she will be able to proceed with the planned surgical intervention. Any acute changes in clinical condition may necessitate her procedure being postponed and/or cancelled. Patient will meet with anesthesia team (MD and/or CRNA) on the day of her procedure for preoperative evaluation/assessment. Questions regarding anesthetic course will be fielded at that time.    Pre-surgical instructions were reviewed with the patient during her PAT appointment, and questions were fielded to satisfaction by PAT clinical staff. She has been instructed on which medications that she will need to hold prior to surgery, as well as the ones that have been deemed safe/appropriate to take on the day of her procedure. As part of the general education provided by PAT, patient made aware both verbally and in writing, that she would need to abstain from the use of any illegal substances during her perioperative course.  She was advised that failure to follow the provided instructions could necessitate case cancellation or result in serious perioperative complications up to and including death. Patient encouraged to contact PAT and/or her surgeon's office to discuss any questions or concerns that may arise prior to surgery; verbalized understanding.   [name], MSN, APRN, FNP-C, [facility] [address]  Perioperative Services Nurse Practitioner Phone: [phone] Fax: [phone] 04/01/23 3:14 PM  NOTE: This note has been prepared using Dragon dictation software. Despite my best ability to proofread, there is always the potential that unintentional transcriptional errors  may still occur from this process.

## 2023-04-02 LAB — TYPE AND SCREEN
ABO/RH(D): O POS
Antibody Screen: NEGATIVE

## 2023-04-05 ENCOUNTER — Other Ambulatory Visit: Payer: Self-pay

## 2023-04-05 ENCOUNTER — Encounter: Payer: Self-pay | Admitting: Urology

## 2023-04-05 ENCOUNTER — Inpatient Hospital Stay: Payer: Medicare Other | Admitting: Urgent Care

## 2023-04-05 ENCOUNTER — Encounter
Admission: RE | Disposition: A | Discharge status: Home / Self Care | Payer: Self-pay | Source: Home / Self Care | Attending: Urology

## 2023-04-05 ENCOUNTER — Inpatient Hospital Stay
Admission: RE | Admit: 2023-04-05 | Discharge: 2023-04-07 | DRG: 354 | Disposition: A | Discharge status: Home / Self Care | Payer: Medicare Other | Attending: Urology | Admitting: Urology

## 2023-04-05 DIAGNOSIS — N261 Atrophy of kidney (terminal): Secondary | ICD-10-CM | POA: Diagnosis not present

## 2023-04-05 DIAGNOSIS — N183 Chronic kidney disease, stage 3 unspecified: Secondary | ICD-10-CM | POA: Diagnosis not present

## 2023-04-05 DIAGNOSIS — E785 Hyperlipidemia, unspecified: Secondary | ICD-10-CM | POA: Diagnosis present

## 2023-04-05 DIAGNOSIS — Z79899 Other long term (current) drug therapy: Secondary | ICD-10-CM | POA: Diagnosis not present

## 2023-04-05 DIAGNOSIS — N039 Chronic nephritic syndrome with unspecified morphologic changes: Secondary | ICD-10-CM

## 2023-04-05 DIAGNOSIS — R7303 Prediabetes: Secondary | ICD-10-CM | POA: Diagnosis not present

## 2023-04-05 DIAGNOSIS — I4891 Unspecified atrial fibrillation: Secondary | ICD-10-CM | POA: Diagnosis present

## 2023-04-05 DIAGNOSIS — I700 Atherosclerosis of aorta: Secondary | ICD-10-CM | POA: Diagnosis not present

## 2023-04-05 DIAGNOSIS — N132 Hydronephrosis with renal and ureteral calculous obstruction: Secondary | ICD-10-CM | POA: Diagnosis not present

## 2023-04-05 DIAGNOSIS — Z87891 Personal history of nicotine dependence: Secondary | ICD-10-CM | POA: Diagnosis not present

## 2023-04-05 DIAGNOSIS — N1330 Unspecified hydronephrosis: Secondary | ICD-10-CM | POA: Diagnosis not present

## 2023-04-05 DIAGNOSIS — Z8249 Family history of ischemic heart disease and other diseases of the circulatory system: Secondary | ICD-10-CM | POA: Diagnosis not present

## 2023-04-05 DIAGNOSIS — I10 Essential (primary) hypertension: Secondary | ICD-10-CM | POA: Diagnosis not present

## 2023-04-05 DIAGNOSIS — Z7901 Long term (current) use of anticoagulants: Secondary | ICD-10-CM | POA: Diagnosis not present

## 2023-04-05 DIAGNOSIS — I129 Hypertensive chronic kidney disease with stage 1 through stage 4 chronic kidney disease, or unspecified chronic kidney disease: Secondary | ICD-10-CM | POA: Diagnosis not present

## 2023-04-05 DIAGNOSIS — K439 Ventral hernia without obstruction or gangrene: Secondary | ICD-10-CM | POA: Diagnosis not present

## 2023-04-05 HISTORY — PX: EPIGASTRIC HERNIA REPAIR: SHX404

## 2023-04-05 HISTORY — PX: ROBOT ASSISTED LAPAROSCOPIC NEPHRECTOMY: SHX5140

## 2023-04-05 LAB — ABO/RH: ABO/RH(D): O POS

## 2023-04-05 SURGERY — NEPHRECTOMY, RADICAL, ROBOT-ASSISTED, LAPAROSCOPIC, ADULT
Anesthesia: General | Site: Abdomen

## 2023-04-05 MED ORDER — FENTANYL CITRATE (PF) 100 MCG/2ML IJ SOLN: 25.0000 ug | INTRAMUSCULAR | Status: DC | PRN

## 2023-04-05 MED ORDER — CHLORHEXIDINE GLUCONATE CLOTH 2 % EX PADS: 6.0000 | MEDICATED_PAD | Freq: Once | CUTANEOUS | Status: DC

## 2023-04-05 MED ORDER — PROPOFOL 10 MG/ML IV BOLUS
INTRAVENOUS | Status: DC | PRN
  Administered 2023-04-05: 170 mg via INTRAVENOUS

## 2023-04-05 MED ORDER — OXYCODONE HCL 5 MG PO TABS: 5.0000 mg | ORAL_TABLET | Freq: Once | ORAL | Status: DC | PRN

## 2023-04-05 MED ORDER — CEFAZOLIN SODIUM-DEXTROSE 2-4 GM/100ML-% IV SOLN
2.0000 g | INTRAVENOUS | Status: AC
  Administered 2023-04-05: 2 g via INTRAVENOUS
  Filled 2023-04-05: qty 100

## 2023-04-05 MED ORDER — CALCIUM CARBONATE ANTACID 500 MG PO CHEW
400.0000 mg | CHEWABLE_TABLET | Freq: Three times a day (TID) | ORAL | Status: DC | PRN
  Administered 2023-04-05: 400 mg via ORAL
  Filled 2023-04-05: qty 2

## 2023-04-05 MED ORDER — DOCUSATE SODIUM 100 MG PO CAPS
100.0000 mg | ORAL_CAPSULE | Freq: Two times a day (BID) | ORAL | Status: DC
  Administered 2023-04-05 – 2023-04-07 (×5): 100 mg via ORAL
  Filled 2023-04-05 (×5): qty 1

## 2023-04-05 MED ORDER — ROCURONIUM BROMIDE 100 MG/10ML IV SOLN
INTRAVENOUS | Status: DC | PRN
  Administered 2023-04-05: 20 mg via INTRAVENOUS
  Administered 2023-04-05: 80 mg via INTRAVENOUS
  Administered 2023-04-05: 20 mg via INTRAVENOUS

## 2023-04-05 MED ORDER — CHLORHEXIDINE GLUCONATE CLOTH 2 % EX PADS
6.0000 | MEDICATED_PAD | Freq: Once | CUTANEOUS | Status: AC
  Administered 2023-04-05: 6 via TOPICAL

## 2023-04-05 MED ORDER — FENTANYL CITRATE (PF) 100 MCG/2ML IJ SOLN
INTRAMUSCULAR | Status: DC | PRN
  Administered 2023-04-05 (×2): 50 ug via INTRAVENOUS
  Administered 2023-04-05: 100 ug via INTRAVENOUS
  Administered 2023-04-05: 50 ug via INTRAVENOUS

## 2023-04-05 MED ORDER — BUPIVACAINE HCL 0.5 % IJ SOLN
INTRAMUSCULAR | Status: DC | PRN
  Administered 2023-04-05: 30 mL

## 2023-04-05 MED ORDER — DEXAMETHASONE SODIUM PHOSPHATE 10 MG/ML IJ SOLN
INTRAMUSCULAR | Status: AC
  Filled 2023-04-05: qty 1

## 2023-04-05 MED ORDER — SIMVASTATIN 20 MG PO TABS
40.0000 mg | ORAL_TABLET | Freq: Every day | ORAL | Status: DC
  Administered 2023-04-05 – 2023-04-06 (×2): 40 mg via ORAL
  Filled 2023-04-05 (×2): qty 2

## 2023-04-05 MED ORDER — PHENYLEPHRINE HCL-NACL 20-0.9 MG/250ML-% IV SOLN
INTRAVENOUS | Status: DC | PRN
Start: 2023-04-05 — End: 2023-04-05
  Administered 2023-04-05: 30 ug/min via INTRAVENOUS

## 2023-04-05 MED ORDER — SEVOFLURANE IN SOLN
RESPIRATORY_TRACT | Status: AC
  Filled 2023-04-05: qty 250

## 2023-04-05 MED ORDER — ZOLPIDEM TARTRATE 5 MG PO TABS: 5.0000 mg | ORAL_TABLET | Freq: Every evening | ORAL | Status: DC | PRN

## 2023-04-05 MED ORDER — ACETAMINOPHEN 10 MG/ML IV SOLN: 1000.0000 mg | Freq: Once | INTRAVENOUS | Status: DC | PRN

## 2023-04-05 MED ORDER — ONDANSETRON HCL 4 MG/2ML IJ SOLN: 4.0000 mg | Freq: Once | INTRAMUSCULAR | Status: DC | PRN

## 2023-04-05 MED ORDER — LISINOPRIL 20 MG PO TABS
40.0000 mg | ORAL_TABLET | Freq: Every day | ORAL | Status: DC
  Administered 2023-04-06 – 2023-04-07 (×2): 40 mg via ORAL
  Filled 2023-04-05 (×3): qty 2

## 2023-04-05 MED ORDER — PHENYLEPHRINE 80 MCG/ML (10ML) SYRINGE FOR IV PUSH (FOR BLOOD PRESSURE SUPPORT)
PREFILLED_SYRINGE | INTRAVENOUS | Status: AC
  Filled 2023-04-05: qty 10

## 2023-04-05 MED ORDER — PHENYLEPHRINE HCL-NACL 20-0.9 MG/250ML-% IV SOLN
INTRAVENOUS | Status: AC
  Filled 2023-04-05: qty 250

## 2023-04-05 MED ORDER — DEXAMETHASONE SODIUM PHOSPHATE 10 MG/ML IJ SOLN
INTRAMUSCULAR | Status: DC | PRN
  Administered 2023-04-05: 10 mg via INTRAVENOUS

## 2023-04-05 MED ORDER — LACTATED RINGERS IV SOLN: INTRAVENOUS | Status: DC

## 2023-04-05 MED ORDER — ACETAMINOPHEN 325 MG PO TABS
650.0000 mg | ORAL_TABLET | ORAL | Status: DC | PRN
  Administered 2023-04-06: 650 mg via ORAL
  Filled 2023-04-05: qty 2

## 2023-04-05 MED ORDER — DIPHENHYDRAMINE HCL 12.5 MG/5ML PO ELIX: 12.5000 mg | ORAL_SOLUTION | Freq: Four times a day (QID) | ORAL | Status: DC | PRN

## 2023-04-05 MED ORDER — SUGAMMADEX SODIUM 200 MG/2ML IV SOLN
INTRAVENOUS | Status: DC | PRN
  Administered 2023-04-05: 200 mg via INTRAVENOUS

## 2023-04-05 MED ORDER — BUPIVACAINE-EPINEPHRINE (PF) 0.25% -1:200000 IJ SOLN
INTRAMUSCULAR | Status: AC
  Filled 2023-04-05: qty 30

## 2023-04-05 MED ORDER — BUPIVACAINE LIPOSOME 1.3 % IJ SUSP
INTRAMUSCULAR | Status: DC | PRN
Start: 2023-04-05 — End: 2023-04-05
  Administered 2023-04-05: 20 mL

## 2023-04-05 MED ORDER — ONDANSETRON HCL 4 MG/2ML IJ SOLN
INTRAMUSCULAR | Status: AC
  Filled 2023-04-05: qty 2

## 2023-04-05 MED ORDER — LIDOCAINE HCL (CARDIAC) PF 100 MG/5ML IV SOSY
PREFILLED_SYRINGE | INTRAVENOUS | Status: DC | PRN
  Administered 2023-04-05: 80 mg via INTRAVENOUS

## 2023-04-05 MED ORDER — FENTANYL CITRATE (PF) 100 MCG/2ML IJ SOLN
INTRAMUSCULAR | Status: AC
  Filled 2023-04-05: qty 2

## 2023-04-05 MED ORDER — MORPHINE SULFATE (PF) 2 MG/ML IV SOLN: 2.0000 mg | INTRAVENOUS | Status: DC | PRN

## 2023-04-05 MED ORDER — PROPOFOL 10 MG/ML IV BOLUS
INTRAVENOUS | Status: AC
  Filled 2023-04-05: qty 20

## 2023-04-05 MED ORDER — ACETAMINOPHEN 10 MG/ML IV SOLN
INTRAVENOUS | Status: DC | PRN
  Administered 2023-04-05: 1000 mg via INTRAVENOUS

## 2023-04-05 MED ORDER — BUPIVACAINE HCL (PF) 0.5 % IJ SOLN
INTRAMUSCULAR | Status: AC
  Filled 2023-04-05: qty 30

## 2023-04-05 MED ORDER — OXYBUTYNIN CHLORIDE ER 10 MG PO TB24
10.0000 mg | ORAL_TABLET | Freq: Every day | ORAL | Status: DC
  Administered 2023-04-05 – 2023-04-07 (×3): 10 mg via ORAL
  Filled 2023-04-05 (×3): qty 1

## 2023-04-05 MED ORDER — ACETAMINOPHEN 10 MG/ML IV SOLN
INTRAVENOUS | Status: AC
  Filled 2023-04-05: qty 100

## 2023-04-05 MED ORDER — SURGIFLO WITH THROMBIN (HEMOSTATIC MATRIX KIT) OPTIME
TOPICAL | Status: DC | PRN
  Administered 2023-04-05: 1 via TOPICAL

## 2023-04-05 MED ORDER — MIDAZOLAM HCL 2 MG/2ML IJ SOLN
INTRAMUSCULAR | Status: AC
  Filled 2023-04-05: qty 2

## 2023-04-05 MED ORDER — STERILE WATER FOR IRRIGATION IR SOLN
Status: DC | PRN
Start: 2023-04-05 — End: 2023-04-05
  Administered 2023-04-05: 500 mL
  Administered 2023-04-05: 3000 mL

## 2023-04-05 MED ORDER — BUPIVACAINE LIPOSOME 1.3 % IJ SUSP
INTRAMUSCULAR | Status: AC
  Filled 2023-04-05: qty 20

## 2023-04-05 MED ORDER — ONDANSETRON HCL 4 MG/2ML IJ SOLN
4.0000 mg | INTRAMUSCULAR | Status: DC | PRN
  Administered 2023-04-06: 4 mg via INTRAVENOUS
  Filled 2023-04-05: qty 2

## 2023-04-05 MED ORDER — CEFAZOLIN SODIUM-DEXTROSE 1-4 GM/50ML-% IV SOLN
1.0000 g | Freq: Three times a day (TID) | INTRAVENOUS | Status: AC
  Administered 2023-04-05 – 2023-04-06 (×2): 1 g via INTRAVENOUS
  Filled 2023-04-05 (×3): qty 50

## 2023-04-05 MED ORDER — HEPARIN SODIUM (PORCINE) 5000 UNIT/ML IJ SOLN
5000.0000 [IU] | Freq: Three times a day (TID) | INTRAMUSCULAR | Status: DC
  Administered 2023-04-05 – 2023-04-07 (×6): 5000 [IU] via SUBCUTANEOUS
  Filled 2023-04-05 (×6): qty 1

## 2023-04-05 MED ORDER — CHLORHEXIDINE GLUCONATE 0.12 % MT SOLN
15.0000 mL | Freq: Once | OROMUCOSAL | Status: AC
  Administered 2023-04-05: 15 mL via OROMUCOSAL

## 2023-04-05 MED ORDER — SODIUM CHLORIDE 0.9 % IV SOLN: INTRAVENOUS | Status: DC

## 2023-04-05 MED ORDER — ROCURONIUM BROMIDE 10 MG/ML (PF) SYRINGE
PREFILLED_SYRINGE | INTRAVENOUS | Status: AC
  Filled 2023-04-05: qty 20

## 2023-04-05 MED ORDER — PHENYLEPHRINE 80 MCG/ML (10ML) SYRINGE FOR IV PUSH (FOR BLOOD PRESSURE SUPPORT)
PREFILLED_SYRINGE | INTRAVENOUS | Status: DC | PRN
  Administered 2023-04-05 (×3): 80 ug via INTRAVENOUS

## 2023-04-05 MED ORDER — DIPHENHYDRAMINE HCL 50 MG/ML IJ SOLN: 12.5000 mg | Freq: Four times a day (QID) | INTRAMUSCULAR | Status: DC | PRN

## 2023-04-05 MED ORDER — MIDAZOLAM HCL 2 MG/2ML IJ SOLN
INTRAMUSCULAR | Status: DC | PRN
  Administered 2023-04-05: 1 mg via INTRAVENOUS

## 2023-04-05 MED ORDER — CHLORHEXIDINE GLUCONATE 0.12 % MT SOLN
OROMUCOSAL | Status: AC
  Filled 2023-04-05: qty 15

## 2023-04-05 MED ORDER — METOPROLOL TARTRATE 25 MG PO TABS
25.0000 mg | ORAL_TABLET | Freq: Two times a day (BID) | ORAL | Status: DC
  Administered 2023-04-05 – 2023-04-07 (×4): 25 mg via ORAL
  Filled 2023-04-05 (×4): qty 1

## 2023-04-05 MED ORDER — TRAZODONE HCL 100 MG PO TABS
100.0000 mg | ORAL_TABLET | Freq: Every day | ORAL | Status: DC
  Administered 2023-04-05 – 2023-04-06 (×2): 100 mg via ORAL
  Filled 2023-04-05 (×2): qty 1

## 2023-04-05 MED ORDER — ONDANSETRON HCL 4 MG/2ML IJ SOLN
INTRAMUSCULAR | Status: DC | PRN
  Administered 2023-04-05: 4 mg via INTRAVENOUS

## 2023-04-05 MED ORDER — OXYCODONE HCL 5 MG/5ML PO SOLN: 5.0000 mg | Freq: Once | ORAL | Status: DC | PRN

## 2023-04-05 MED ORDER — OXYCODONE HCL 5 MG PO TABS: 5.0000 mg | ORAL_TABLET | ORAL | Status: DC | PRN

## 2023-04-05 MED ORDER — ORAL CARE MOUTH RINSE: 15.0000 mL | Freq: Once | OROMUCOSAL | Status: AC

## 2023-04-05 SURGICAL SUPPLY — 110 items
ADH SKN CLS APL DERMABOND .7 (GAUZE/BANDAGES/DRESSINGS) ×4
AGENT HMST KT MTR STRL THRMB (HEMOSTASIS) ×2
ANCHOR TIS RET SYS 1550ML (BAG) IMPLANT
APL ESCP 34 STRL LF DISP (HEMOSTASIS) ×2
APL PRP STRL LF DISP 70% ISPRP (MISCELLANEOUS) ×6
APPLICATOR SURGIFLO ENDO (HEMOSTASIS) ×2 IMPLANT
APPLIER CLIP LOGIC TI 5 (MISCELLANEOUS) IMPLANT
APR CLP MED LRG 33X5 (MISCELLANEOUS)
BAG LAPAROSCOPIC 12 15 PORT 16 (BASKET) ×2 IMPLANT
BAG RETRIEVAL 12/15 (BASKET)
BAG SPEC RTRVL C1550 25.4 (BAG) ×2
BLADE CLIPPER SURG (BLADE) ×2 IMPLANT
BULB RESERV EVAC DRAIN JP 100C (MISCELLANEOUS) IMPLANT
CHLORAPREP W/TINT 26 (MISCELLANEOUS) ×6 IMPLANT
CLIP LIGATING HEM O LOK PURPLE (MISCELLANEOUS) ×8 IMPLANT
CLIP SUT LAPRA TY ABSORB (SUTURE) IMPLANT
COVER TIP SHEARS 8 DVNC (MISCELLANEOUS) ×2 IMPLANT
DEFOGGER SCOPE WARMER CLEARIFY (MISCELLANEOUS) ×2 IMPLANT
DERMABOND ADVANCED .7 DNX12 (GAUZE/BANDAGES/DRESSINGS) ×4 IMPLANT
DRAIN CHANNEL JP 19F (MISCELLANEOUS) IMPLANT
DRAPE ARM DVNC X/XI (DISPOSABLE) ×8 IMPLANT
DRAPE COLUMN DVNC XI (DISPOSABLE) ×2 IMPLANT
DRAPE LAPAROTOMY 100X77 ABD (DRAPES) ×2 IMPLANT
DRAPE SHEET LG 3/4 BI-LAMINATE (DRAPES) ×2 IMPLANT
ELECT CAUTERY BLADE 6.4 (BLADE) ×2 IMPLANT
ELECT REM PT RETURN 9FT ADLT (ELECTROSURGICAL) ×4
ELECTRODE REM PT RTRN 9FT ADLT (ELECTROSURGICAL) ×4 IMPLANT
FORCEPS BPLR 8 MD DVNC XI (FORCEP) ×2 IMPLANT
FORCEPS BPLR FENES DVNC XI (FORCEP) ×2 IMPLANT
FORCEPS PROGRASP DVNC XI (FORCEP) ×2 IMPLANT
GAUZE 4X4 16PLY ~~LOC~~+RFID DBL (SPONGE) ×2 IMPLANT
GLOVE BIO SURGEON STRL SZ 6.5 (GLOVE) ×4 IMPLANT
GLOVE ORTHO TXT STRL SZ7.5 (GLOVE) ×2 IMPLANT
GOWN STRL REUS W/ TWL LRG LVL3 (GOWN DISPOSABLE) ×10 IMPLANT
GOWN STRL REUS W/ TWL XL LVL3 (GOWN DISPOSABLE) ×4 IMPLANT
GOWN STRL REUS W/TWL LRG LVL3 (GOWN DISPOSABLE) ×10
GOWN STRL REUS W/TWL XL LVL3 (GOWN DISPOSABLE) ×4
GRASPER SUT TROCAR 14GX15 (MISCELLANEOUS) ×2 IMPLANT
GRASPER TIP-UP FEN DVNC XI (INSTRUMENTS) ×2 IMPLANT
HANDLE YANKAUER SUCT BULB TIP (MISCELLANEOUS) IMPLANT
HEMOSTAT SURGICEL 2X14 (HEMOSTASIS) ×2 IMPLANT
HOLDER FOLEY CATH W/STRAP (MISCELLANEOUS) ×2 IMPLANT
IRRIGATION STRYKERFLOW (MISCELLANEOUS) ×2 IMPLANT
IRRIGATOR STRYKERFLOW (MISCELLANEOUS) ×2
IV NS 1000ML (IV SOLUTION)
IV NS 1000ML BAXH (IV SOLUTION) ×2 IMPLANT
KIT PINK PAD W/HEAD ARE REST (MISCELLANEOUS) ×2
KIT PINK PAD W/HEAD ARM REST (MISCELLANEOUS) ×2 IMPLANT
KIT TURNOVER CYSTO (KITS) ×2 IMPLANT
KIT TURNOVER KIT A (KITS) ×2 IMPLANT
LOOP VESSEL MAXI 1X406 RED (MISCELLANEOUS) ×2 IMPLANT
MANIFOLD NEPTUNE II (INSTRUMENTS) ×4 IMPLANT
MESH VENTRALEX ST 1-7/10 CRC S (Mesh General) IMPLANT
MESH VENTRALEX ST 2.5 CRC MED (Mesh General) IMPLANT
MESH VENTRALEX ST 8CM LRG (Mesh General) IMPLANT
NDL HYPO 22X1.5 SAFETY MO (MISCELLANEOUS) ×4 IMPLANT
NDL INSUFFLATION 14GA 120MM (NEEDLE) ×2 IMPLANT
NEEDLE HYPO 22X1.5 SAFETY MO (MISCELLANEOUS) ×4
NEEDLE INSUFFLATION 14GA 120MM (NEEDLE) ×2
NS IRRIG 500ML POUR BTL (IV SOLUTION) ×4 IMPLANT
OBTURATOR OPTICAL STND 8 DVNC (TROCAR) ×4
OBTURATOR OPTICALSTD 8 DVNC (TROCAR) ×2 IMPLANT
PACK BASIN MINOR ARMC (MISCELLANEOUS) ×2 IMPLANT
PACK LAP CHOLECYSTECTOMY (MISCELLANEOUS) ×2 IMPLANT
RELOAD STAPLE 60 2.5 WHT DVNC (STAPLE) IMPLANT
RELOAD STAPLE 60 2.6 WHT THN (STAPLE) IMPLANT
SCISSORS MNPLR CVD DVNC XI (INSTRUMENTS) ×2 IMPLANT
SEAL UNIV 5-12 XI (MISCELLANEOUS) ×8 IMPLANT
SET TUBE SMOKE EVAC HIGH FLOW (TUBING) ×2 IMPLANT
SLEEVE PROTECTION STRL DISP (MISCELLANEOUS) IMPLANT
SLEEVE Z-THREAD 5X100MM (TROCAR) IMPLANT
SOL ELECTROSURG ANTI STICK (MISCELLANEOUS) ×2
SOLUTION ELECTROSURG ANTI STCK (MISCELLANEOUS) ×2 IMPLANT
SPONGE T-LAP 18X18 ~~LOC~~+RFID (SPONGE) ×2 IMPLANT
SPONGE VERSALON 4X4 4PLY (MISCELLANEOUS) IMPLANT
STAPLE ECHEON FLEX 60 POW ENDO (STAPLE) IMPLANT
STAPLER 60 SUREFORM DVNC (STAPLE) IMPLANT
STAPLER RELOAD 2.5X60 WHT DVNC (STAPLE)
STAPLER RELOAD WHITE 60MM (STAPLE) ×6
STAPLER SKIN PROX 35W (STAPLE) ×2 IMPLANT
STRAP SAFETY 5IN WIDE (MISCELLANEOUS) ×4 IMPLANT
SURGIFLO W/THROMBIN 8M KIT (HEMOSTASIS) ×2 IMPLANT
SUT DVC VLOC 90 3-0 CV23 UNDY (SUTURE) IMPLANT
SUT ETHIBOND 0 MO6 C/R (SUTURE) ×2 IMPLANT
SUT ETHILON 3-0 FS-10 30 BLK (SUTURE)
SUT MNCRL 4-0 (SUTURE) ×2
SUT MNCRL 4-0 27XMFL (SUTURE) ×2
SUT MNCRL AB 4-0 PS2 18 (SUTURE) ×4 IMPLANT
SUT PDS AB 0 CT1 27 (SUTURE) IMPLANT
SUT PDS AB 1 TP1 96 (SUTURE) ×2 IMPLANT
SUT PROLENE 5 0 RB 1 DA (SUTURE) IMPLANT
SUT VIC AB 0 CT1 36 (SUTURE) IMPLANT
SUT VIC AB 2-0 SH 27 (SUTURE)
SUT VIC AB 2-0 SH 27XBRD (SUTURE) IMPLANT
SUT VIC AB 3-0 SH 27 (SUTURE) ×2
SUT VIC AB 3-0 SH 27X BRD (SUTURE) ×2 IMPLANT
SUT VICRYL 0 UR6 27IN ABS (SUTURE) IMPLANT
SUTURE EHLN 3-0 FS-10 30 BLK (SUTURE) IMPLANT
SUTURE MNCRL 4-0 27XMF (SUTURE) ×2 IMPLANT
SYR 20ML LL LF (SYRINGE) ×2 IMPLANT
SYR BULB IRRIG 60ML STRL (SYRINGE) ×2 IMPLANT
TAPE CLOTH 3X10 WHT NS LF (GAUZE/BANDAGES/DRESSINGS) ×4 IMPLANT
TRAP FLUID SMOKE EVACUATOR (MISCELLANEOUS) ×4 IMPLANT
TRAY FOLEY MTR SLVR 16FR STAT (SET/KITS/TRAYS/PACK) ×2 IMPLANT
TROCAR 12M 150ML BLUNT (TROCAR) IMPLANT
TROCAR ENDOPATH XCEL 12X100 BL (ENDOMECHANICALS) IMPLANT
TROCAR Z-THREAD FIOS 12X100MM (TROCAR) ×4 IMPLANT
TROCAR Z-THREAD FIOS 5X100MM (TROCAR) IMPLANT
WATER STERILE IRR 3000ML UROMA (IV SOLUTION) IMPLANT
WATER STERILE IRR 500ML POUR (IV SOLUTION) ×4 IMPLANT

## 2023-04-05 NOTE — Transfer of Care (Signed)
Immediate Anesthesia Transfer of Care Note  Patient: Jeff Ryan  Procedure(s) Performed: XI ROBOTIC ASSISTED LAPAROSCOPIC NEPHRECTOMY (Left) HERNIA REPAIR EPIGASTRIC ADULT (Abdomen)  Patient Location: PACU  Anesthesia Type:General  Level of Consciousness: awake  Airway & Oxygen Therapy: Patient Spontanous Breathing and Patient connected to face mask oxygen  Post-op Assessment: Report given to RN and Post -op Vital signs reviewed and stable  Post vital signs: Reviewed and stable  Last Vitals:  Vitals Value Taken Time  BP    Temp    Pulse 96 04/05/23 1104  Resp 14 04/05/23 1104  SpO2 100 % 04/05/23 1104  Vitals shown include unfiled device data.  Last Pain:  Vitals:   04/05/23 0625  TempSrc: Temporal  PainSc: 0-No pain         Complications: No notable events documented.

## 2023-04-05 NOTE — Anesthesia Procedure Notes (Addendum)
Procedure Name: Intubation Date/Time: 04/05/2023 7:46 AM  Performed by: Elisabeth Pigeon, CRNAPre-anesthesia Checklist: Patient identified, Patient being monitored, Timeout performed, Emergency Drugs available and Suction available Patient Re-evaluated:Patient Re-evaluated prior to induction Oxygen Delivery Method: Circle system utilized Preoxygenation: Pre-oxygenation with 100% oxygen Induction Type: IV induction Ventilation: Mask ventilation without difficulty Laryngoscope Size: Mac, McGraph and 4 Grade View: Grade I Tube type: Oral Tube size: 7.0 mm Number of attempts: 1 Airway Equipment and Method: Stylet Placement Confirmation: ETT inserted through vocal cords under direct vision, positive ETCO2 and breath sounds checked- equal and bilateral Secured at: 23 cm Tube secured with: Tape Dental Injury: Teeth and Oropharynx as per pre-operative assessment

## 2023-04-05 NOTE — Op Note (Signed)
Anterior abdominal wall hernia repair, initial, reducible, fascial defect diameter 3.4 cm.  Pre-operative Diagnosis: Epigastric hernia, reducible.  Post-operative Diagnosis: same.    Surgeon: Campbell Lerner, M.D., [facility]  Anesthesia: General Endotracheal  Findings: Supraumbilical defect, just to the right of midline with significantly enlarged hernia sac through a hernia fascial defect of 3.4 cm diameter.  Estimated Blood Loss: 5 mL         Specimens: None          Complications: none              Procedure Details  The patient was seen again in the Holding Room. The benefits, complications, treatment options, and expected outcomes were discussed with the patient. The risks of bleeding, infection, recurrence of symptoms, failure to resolve symptoms, unanticipated injury, prosthetic placement, prosthetic infection, any of which could require further surgery were reviewed with the patient. The likelihood of improving the patient's symptoms with return to their baseline status is anticipated.  The patient and/or family concurred with the proposed plan, giving informed consent.  The patient was taken to Operating Room, identified and the procedure verified.    A Time Out was held and the above information confirmed. I assume the remainder of the procedure following Dr. Delana Meyer robotic left nephrectomy.  There were 4 robotic 8 mm trocars of the left abdomen.  There were two 12 mm laparoscopic assistant ports in the epigastrium, 1 sufficiently adjacent to the known hernia defect for utilization of specimen extraction. I removed the adjacent port, and proceeded with skin incision to extend from that port site to the right side of the supraumbilical area where the hernia sac was present. I then proceeded with dissecting out the hernia sac, dissecting it free from the fascia and reducing it with the preperitoneal contents.  With attempts to remove the left kidney within the specimen bag, I  eventually extended the midline incision cephalad just sufficiently to remove the specimen with minimal difficulty. This took a 3.4 cm defect and extended it approximately 2 cm along the midline.  His abdominal wall did not provide any tension for primary closure of this midline incision.  Therefore we proceeded with closure of the midline fascia with a running 0 PDS.  With this completed we then reinsufflated the abdomen for tap infiltration of local anesthesia. Under direct visualization we proceeded with injecting a total of 50 mL of a mixture of Exparel with half percent Marcaine. We then closed the fascia of the second 12 mm laparoscopic port.  I provided additional fascial closure of one of the 8 mm ports as it was leaking air during reinsufflation. I then irrigated the largest wound at the extraction site, ensured adequate hemostasis, and closed the dead space with interrupted 3-0 Vicryl sutures.  I then closed all the incisions with a combination of interrupted and running 4-0 Monocryl subcuticular's.  I then sealed all the incisions with Dermabond.  Patient appeared to tolerate the procedure well, was subsequently awakened, transferred to the PACU in stable condition.     Campbell Lerner M.D., [facility] 04/05/2023 11:02 AM

## 2023-04-05 NOTE — Interval H&P Note (Signed)
History and Physical Interval Note:  04/05/2023 7:32 AM  Jeff Ryan  has presented today for surgery, with the diagnosis of Severe Left Hydronephrosis, Left Renal Atrophy open epigastric hernia repair.  The various methods of treatment have been discussed with the patient and family. After consideration of risks, benefits and other options for treatment, the patient has consented to  Procedure(s): XI ROBOTIC VS HAND ASSISTED LAPAROSCOPIC NEPHRECTOMY (Left) HERNIA REPAIR EPIGASTRIC ADULT (N/A) as a surgical intervention.  The patient's history has been reviewed, patient examined, no change in status, stable for surgery.  I have reviewed the patient's chart and labs.  Questions were answered to the patient's satisfaction.     Jeff Ryan

## 2023-04-05 NOTE — H&P (Signed)
04/05/23  RRR CTAB  Cleared by cardiology  No there changes in H&P since office visit  Referring provider: [name], DO [address] [place],  [place] [address]       Chief Complaint  Patient presents with   Results      CT scan       HPI: 77 year-old male who returns today for follow-up CT urogram.    He initially was seen and evaluated for elevated/rising PSA after renal ultrasound demonstrated severe chronic left hydronephrosis of unclear etiology. He underwent CT urogram of which the final read is pending. However, he has a severely atrophic left kidney with severe hydronephrosis and hydroureter to the level of the UVJ where he has a large, 2.2 centimeter stone, 1670 hounsfield units. I personally reviewed that study today awaiting final radiologic interpretation.    Reports occasional back pain, especially during long drives. He states that his hernia is mainly bothersome when he develops gas.      PMH:     Past Medical History:  Diagnosis Date   Dyslipidemia     Essential hypertension            Surgical History:      Past Surgical History:  Procedure Laterality Date   CATARACT EXTRACTION, BILATERAL        left 10/26/2014 right 10/12/2014   TONSILLECTOMY AND ADENOIDECTOMY       US ECHOCARDIOGRAPHY        10/2014   VASECTOMY              Home Medications:  Allergies as of 01/29/2023   No Known Allergies         Medication List           Accurate as of January 29, 2023 11:59 PM. If you have any questions, ask your nurse or doctor.              lisinopril 40 MG tablet Commonly known as: ZESTRIL TAKE 1 TABLET BY MOUTH DAILY    Omega 3 1000 MG Caps Take 1 capsule (1,000 mg total) by mouth 3 (three) times daily.    OVER THE COUNTER MEDICATION Take 2 tablets by mouth 2 (two) times daily with a meal. Kidney Cop (Calcium Oxalate Protector)    simvastatin 40 MG tablet Commonly known as: ZOCOR TAKE 1 TABLET BY MOUTH DAILY  WITH SUPPER     traZODone 100 MG tablet Commonly known as: DESYREL Take 1 tablet (100 mg total) by mouth at bedtime.             Family History:      Family History  Problem Relation Age of Onset   Heart disease Brother     Heart attack Brother          x 3   Prostate cancer Neg Hx     Colon cancer Neg Hx            Social History:  reports that he quit smoking about 16 years ago. His smoking use included cigarettes. He has quit using smokeless tobacco. He reports current alcohol use. He reports that he does not use drugs.     Physical Exam: BP 116/75 (BP Location: Left Arm, Patient Position: Sitting, Cuff Size: Normal)   Pulse 75   Ht 5\' 11"  (1.803 m)   Wt 189 lb 9.6 oz (86 kg)   BMI 26.44 kg/m   Constitutional:  Alert and oriented, No acute distress. Abd: Fascial  defect just superior and to the right of the umbilicus, 4-5 centimeters x about 3 centimeters HEENT: [place] AT, moist mucus membranes.  Trachea midline, no masses. Neurologic: Grossly intact, no focal deficits, moving all 4 extremities. Psychiatric: Normal mood and affect.   Pertinent Imaging:   He underwent CT urogram of which the final read is pending. However, he has a severely atrophic left kidney with severe hydronephrosis and hydroureter in the UVJ where he has a large, 2.2 centimeter stone, 1670 hounsfield units. Personally reviewed that study today awaiting final radiologic interpretation.      Assessment & Plan:     Severe left hydronephrosis/atrophy -Based on the appearance on CT scan there's little to no function, question the benefit of treating.  -Very chronic process, relatively abrupt change in creatinine is likely unrelated -Role of Lasix renal study to assess function is relatively limited based on the overall appearance and would necessitate intervention/drainage to unobstruct the kidney to accurately assess.  Will defer this study, patient is agreeable. -Could consider radical nephrectomy to remove kidney  is a nidus for infection and pain; alternatively manage conservatively with no intervention although could cause issues down the road. Lastly we could treat the stone which would relieve the obstruction but not likely improving overall renal function and it's a fairly significant stone burden would be somewhat difficult.  -Could also consider ureterotomy with stone removal at the time of at the time of surgery if technically feasible.  If it would necessitate additional significant dissection, would likely defer as we will no longer be in continuity with the collecting system. -He is most interested in simple left nephrectomy.  Risk and benefits including risk of bleeding, infection, damage surrounding structures, postoperative course, amongst others were all discussed in detail.  We also discussed the risk of anesthesia including heart attack, stroke, death, etc.  He is willing to proceed as planned.   Obstructing left ureteral calculus -As above   Abdominal wall hernia -Sometimes bothersome to him. He wonders if we can get this all done at once. We'll decide whether or not to proceed with hand assist versus robotic depending on port placement and whether or not we'll plan on fixing his hernia, concomitant. We'll coordinate with general surgeon regarding the recommendations.  -We'll have him referral to general surgery.   4. CKD -No anticipate creatinine to change much postoperatively based on minimal to no function of his left kidney,       Return for refer to general surgeon.     [facility] [address] [place], [place] [address] [phone]

## 2023-04-05 NOTE — H&P (Signed)
Patient ID: Jeff Ryan, male   DOB: [birth year], 77 y.o.   MRN: [id number]  Chief Complaint: Longstanding epigastric hernia   History of Present Illness Jeff Ryan is a 77 y.o. male with a 20-year history of a supraumbilical hernia bulge.  Denies any significant symptoms or pain.  Has been retired for multiple years.  Wife has been encouraging him to get it repaired.   Past Medical History Past Medical History:  Diagnosis Date   Aortic atherosclerosis (HCC)    Atrial fibrillation with slow ventricular response (HCC) 03/26/2023   a.) noted on preop ECG 03/26/2023; b.) CHA2DS2-VASc: 4 (age x 2, HTN, vascular disease history); rate/rhythm maintained on oral metoprolol tartrate; chronically anticoagulated using standard dose apixaban (will start DOAC postop once cleared by surgery on 04/05/2023).   Atrophy of kidney    Dyslipidemia    Epigastric hernia    Essential hypertension    Insomnia    Nephrolithiasis    On apixaban therapy 04/01/2023   Pre-diabetes    Systolic murmur 04/01/2023   a.) grade II/VI; mitral area   Umbilical hernia       Past Surgical History:  Procedure Laterality Date   CATARACT EXTRACTION, BILATERAL     left 10/26/2014 right 10/12/2014   TONSILLECTOMY AND ADENOIDECTOMY     US ECHOCARDIOGRAPHY     10/2014   VASECTOMY      No Known Allergies  Current Facility-Administered Medications  Medication Dose Route Frequency Provider Last Rate Last Admin   ceFAZolin (ANCEF) IVPB 2g/100 mL premix  2 g Intravenous 30 min Pre-Op [name], MD       Chlorhexidine Gluconate Cloth 2 % PADS 6 each  6 each Topical Once [name], MD       Chlorhexidine Gluconate Cloth 2 % PADS 6 each  6 each Topical Once Campbell Lerner, MD       And   Chlorhexidine Gluconate Cloth 2 % PADS 6 each  6 each Topical Once Campbell Lerner, MD       lactated ringers infusion   Intravenous Continuous Reed Breech, MD 10 mL/hr at 04/05/23 0647 New Bag at 04/05/23 0647     Family History Family History  Problem Relation Age of Onset   Heart disease Brother    Heart attack Brother        x 3   Prostate cancer Neg Hx    Colon cancer Neg Hx       Social History Social History   Tobacco Use   Smoking status: Former    Current packs/day: 0.00    Types: Cigarettes    Quit date: 01/11/2007    Years since quitting: 16.2   Smokeless tobacco: Former  Building services engineer status: Never Used  Substance Use Topics   Alcohol use: Yes    Alcohol/week: 7.0 standard drinks of alcohol    Types: 7 Cans of beer per week   Drug use: No        Review of Systems  Constitutional: Negative.   HENT: Negative.    Eyes: Negative.   Respiratory: Negative.    Cardiovascular: Negative.   Gastrointestinal: Negative.   Genitourinary:  Positive for frequency.  Skin: Negative.   Neurological: Negative.   Psychiatric/Behavioral: Negative.       Physical Exam Blood pressure (!) 146/92, pulse 64, temperature 98.6 F (37 C), temperature source Temporal, resp. rate 16, height 5\' 11"  (1.803 m), weight 80.7 kg, SpO2 100%. Last Weight  Most recent update: 04/05/2023  6:26 AM    Weight  80.7 kg (178 lb)             CONSTITUTIONAL: Well developed, and nourished, appropriately responsive and aware without distress.   EYES: Sclera non-icteric.   EARS, NOSE, MOUTH AND THROAT:  The oropharynx is clear. Oral mucosa is pink and moist.    Hearing is intact to voice.  NECK: Trachea is midline, and there is no jugular venous distension.  LYMPH NODES:  Lymph nodes in the neck are not appreciated. RESPIRATORY:  Lungs are clear, and breath sounds are equal bilaterally.  Normal respiratory effort without pathologic use of accessory muscles. CARDIOVASCULAR: Heart is regular in rate and rhythm.   Well perfused.  GI: The abdomen has a supraumbilical midline mass that is easily reducible to what feels like a defect likely 2 cm in diameter.  Soft, nontender, and nondistended.  There were no other palpable masses.  I did not appreciate hepatosplenomegaly.  MUSCULOSKELETAL:  Symmetrical muscle tone appreciated in all four extremities.    SKIN: Skin turgor is normal. No pathologic skin lesions appreciated.  NEUROLOGIC:  Motor and sensation appear grossly normal.  Cranial nerves are grossly without defect. PSYCH:  Alert and oriented to person, place and time. Affect is appropriate for situation.  Data Reviewed I have personally reviewed what is currently available of the patient's imaging, recent labs and medical records.   Labs:     Latest Ref Rng & Units 07/20/2022    8:20 AM 06/23/2021    9:33 AM 06/17/2020    8:25 AM  CBC  WBC 3.8 - 10.8 Thousand/uL 7.2  6.7  5.7   Hemoglobin 13.2 - 17.1 g/dL 78.2  95.6  21.3   Hematocrit 38.5 - 50.0 % 45.7  45.7  40.1   Platelets 140 - 400 Thousand/uL 291  225  235       Latest Ref Rng & Units 10/26/2022    8:05 AM 07/20/2022    8:20 AM 07/21/2021    8:54 AM  CMP  Glucose 65 - 99 mg/dL 086  578  469   BUN 7 - 25 mg/dL 29  30  28    Creatinine 0.70 - 1.28 mg/dL 6.29  5.28  4.13   Sodium 135 - 146 mmol/L 131  135  137   Potassium 3.5 - 5.3 mmol/L 5.2  5.2  5.1   Chloride 98 - 110 mmol/L 92  97  99   CO2 20 - 32 mmol/L 28  31  30    Calcium 8.6 - 10.3 mg/dL 9.8  24.4  9.7   Total Protein 6.1 - 8.1 g/dL  7.6    Total Bilirubin 0.2 - 1.2 mg/dL  0.6    AST 10 - 35 U/L  22    ALT 9 - 46 U/L  21        Imaging: Radiological images reviewed:   Within last 24 hrs: No results found.  Assessment    Longstanding epigastric hernia, less than 3 cm diameter. Patient Active Problem List   Diagnosis Date Noted   Epigastric hernia 02/11/2023   History of nephrolithiasis 05/08/2019   Overweight (BMI 25.0-29.9) 04/04/2018   Former smoker 03/02/2017   Screening for colon cancer 03/02/2017   Elevated blood sugar 08/20/2016   Benign hypertension with CKD (chronic kidney disease) stage III (HCC) 04/15/2015   Hyperlipidemia  04/15/2015    Plan    Will coordinate left nephrectomy with Dr. [name], and assist with repair of this  anterior abdominal wall hernia.  Unlikely that mesh will be necessary.  Perhaps could utilize the epigastric defect for extraction of the specimen.  Would anticipate primary suture repair, and closure.   Hernia risk discussed with patient.  Potential for recurrence, anesthetic risks, infection, mesh issues (although I do not anticipate utilization of mesh in this circumstance) bleeding etc.     Face-to-face time spent with the patient and accompanying care providers(if present) was 30 minutes, with more than 50% of the time spent counseling, educating, and coordinating care of the patient.     These notes generated with voice recognition software. I apologize for typographical errors.    Panel 1 XI ROBOTIC VS HAND ASSISTED LAPAROSCOPIC NEPHRECTOMY:  Panel 2 HERNIA REPAIR EPIGASTRIC ADULT:   Face-to-face time spent with the patient and accompanying care providers(if present) was 30 minutes, with more than 50% of the time spent counseling, educating, and coordinating care of the patient.    These notes generated with voice recognition software. I apologize for typographical errors.  Campbell Lerner M.D., FACS 04/05/2023, 7:29 AM

## 2023-04-05 NOTE — Plan of Care (Signed)
  Problem: Education: Goal: Knowledge of General Education information will improve Description: Including pain rating scale, medication(s)/side effects and non-pharmacologic comfort measures Outcome: Progressing   Problem: Clinical Measurements: Goal: Ability to maintain clinical measurements within normal limits will improve Outcome: Progressing Goal: Respiratory complications will improve Outcome: Progressing   Problem: Nutrition: Goal: Adequate nutrition will be maintained Outcome: Progressing

## 2023-04-05 NOTE — Anesthesia Postprocedure Evaluation (Signed)
Anesthesia Post Note  Patient: Jeff Ryan  Procedure(s) Performed: XI ROBOTIC ASSISTED LAPAROSCOPIC NEPHRECTOMY (Left) HERNIA REPAIR EPIGASTRIC ADULT (Abdomen)  Patient location during evaluation: PACU Anesthesia Type: General Level of consciousness: awake and alert Pain management: pain level controlled Vital Signs Assessment: post-procedure vital signs reviewed and stable Respiratory status: spontaneous breathing, nonlabored ventilation, respiratory function stable and patient connected to nasal cannula oxygen Cardiovascular status: blood pressure returned to baseline and stable Postop Assessment: no apparent nausea or vomiting Anesthetic complications: no   No notable events documented.   Last Vitals:  Vitals:   04/05/23 1245 04/05/23 1310  BP: 110/70 130/81  Pulse: 89 98  Resp: 20 18  Temp:  36.6 C  SpO2: 95% 96%    Last Pain:  Vitals:   04/05/23 1310  TempSrc: Oral  PainSc: 0-No pain                 Corinda Gubler

## 2023-04-05 NOTE — Progress Notes (Signed)
Transition of Care [facility]) - Inpatient Brief Assessment   Patient Details  Name: Jeff Ryan MRN: [id number] Date of Birth: 1945-11-20  [facility] CM/SW Contact:    Darolyn Rua, LCSW Phone Number: 04/05/2023, 2:00 PM   Clinical Narrative:  Patient from home with wife, presented for left hernia repair surgery.   PCP Karamalagos, DO Insurance [employer] Medicare  No identified TOC needs at this time. Please consult TOC if needs arise.    Transition of Care Asessment: Insurance and Status: Insurance coverage has been reviewed Patient has primary care physician: Yes Home environment has been reviewed: home with wife Prior level of function:: independent Prior/Current Home Services: No current home services Social Determinants of Health Reivew: SDOH reviewed no interventions necessary Readmission risk has been reviewed: Yes Transition of care needs: no transition of care needs at this time

## 2023-04-05 NOTE — Anesthesia Preprocedure Evaluation (Signed)
Anesthesia Evaluation  Patient identified by MRN, date of birth, ID band Patient awake    Reviewed: Allergy & Precautions, NPO status , Patient's Chart, lab work & pertinent test results  History of Anesthesia Complications Negative for: history of anesthetic complications  Airway Mallampati: II  TM Distance: >3 FB Neck ROM: Full    Dental  (+) Partial Lower, Upper Dentures   Pulmonary neg pulmonary ROS, neg sleep apnea, neg COPD, Patient abstained from smoking.Not current smoker, former smoker   Pulmonary exam normal breath sounds clear to auscultation       Cardiovascular Exercise Tolerance: Good METShypertension, Pt. on medications (-) CAD and (-) Past MI (-) dysrhythmias  Rhythm:Irregular Rate:Normal - Systolic murmurs Recently diagnosed with afib on routine preop EKG. No cardiac history, good exercise tolerance. Saw cardiologist, TTE unremarkable, decision to delay eliquis until after this surgery   Neuro/Psych negative neurological ROS  negative psych ROS   GI/Hepatic ,neg GERD  ,,(+)     (-) substance abuse    Endo/Other  neg diabetes    Renal/GU Renal diseasenegative Renal ROS     Musculoskeletal   Abdominal   Peds  Hematology   Anesthesia Other Findings Past Medical History: No date: Aortic atherosclerosis (HCC) 03/26/2023: Atrial fibrillation with slow ventricular response (HCC)     Comment:  a.) noted on preop ECG 03/26/2023; b.) CHA2DS2-VASc: 4               (age x 2, HTN, vascular disease history); rate/rhythm               maintained on oral metoprolol tartrate; chronically               anticoagulated using standard dose apixaban (will start               DOAC postop once cleared by surgery on 04/05/2023). No date: Atrophy of kidney No date: Dyslipidemia No date: Epigastric hernia No date: Essential hypertension No date: Insomnia No date: Nephrolithiasis 04/01/2023: On apixaban therapy No  date: Pre-diabetes 04/01/2023: Systolic murmur     Comment:  a.) grade II/VI; mitral area No date: Umbilical hernia  Reproductive/Obstetrics                              Anesthesia Physical Anesthesia Plan  ASA: 3  Anesthesia Plan: General   Post-op Pain Management: Ofirmev IV (intra-op)*   Induction: Intravenous  PONV Risk Score and Plan: 4 or greater and Ondansetron, Dexamethasone and Midazolam  Airway Management Planned: Oral ETT and Video Laryngoscope Planned  Additional Equipment: None  Intra-op Plan:   Post-operative Plan: Extubation in OR  Informed Consent: I have reviewed the patients History and Physical, chart, labs and discussed the procedure including the risks, benefits and alternatives for the proposed anesthesia with the patient or authorized representative who has indicated his/her understanding and acceptance.     Dental advisory given  Plan Discussed with: CRNA and Surgeon  Anesthesia Plan Comments: (Discussed risks of anesthesia with patient, including PONV, sore throat, lip/dental/eye damage. Rare risks discussed as well, such as cardiorespiratory and neurological sequelae, and allergic reactions. Discussed the role of CRNA in patient's perioperative care. Patient understands.)         Anesthesia Quick Evaluation

## 2023-04-05 NOTE — Op Note (Signed)
DATE OF PROCEDURE:  04/05/23  PREOPERATIVE DIAGNOSES: Left hydronephrosis, left renal atrophy  POSTOPERATIVE DIAGNOSES: same as above   PROCEDURE PERFORMED: Left robotic nephrectomy   ATTENDING SURGEON: Claris Gladden, MD    Assistant: Legrand Rams, MD   ANESTHESIA: General anesthesia.   ESTIMATED BLOOD LOSS: 50 mL.     DRAINS: 16 Fr Foley   COMPLICATIONS: None.     SPECIMENS: Left kidney    INDICATION: This is a 77 year old man with a severely hydronephrotic and atrophic left kidney. Risk and benefits of the procedure were explained in detail to the patient who agreed to proceed as planned.     PROCEDURE: The patient was correctly identified in the preoperative holding area and informed consent was confirmed.  He was brought to the operating suite and placed on the table in the supine position. At this time, universal timeout protocol performed. All team members were identified. Venodyne boots were placed and she was administered 2 grams of IV Ancef in the perioperative period. An 47 French 3-way Foley catheter was then placed using standard sterile technique and hooked up to a CBI for planned use later in the procedure.  He was then repositioned in the lateral decubitus position with the left flank up and all pressure points were carefully padded. He was secured to the table using straps, gel pads, and tape.  He was then prepped and draped in standard surgical fashion. At this point in time, a Veress needle was used in the left lateral mid abdomen to insufflate the abdominal cavity. There was negative saline drop test without any bloody aspirate and she did have relatively low opening pressures of 3 mmHg. The abodmen insufflated nicely and it was well tolerated. At this point, all port sites were carefully planned and mapped out across her left hemi-abdomen; Lipsoma marcaine was used in all of the laparoscopic port sites. The port sites were as follows: A 12 mm assistant port was placed  in the just to left  and superior the level of the umbilicus, an additional 12 mm midabdominal port site was placed and used for a camera port using a bariatric 12 mm trocar, an 8 mm robotic port was placed in the left upper quadrant in the mammary line just below the costal margin, 2 additional 8 mm robotic port sites were placed in the left lateral lower abdomen and left lower mid abdomen as used for additional robotic port sites. The initial trocar was placed using a 8 mm.. There was no injury noted from the Veress needle or trocar sites. The robot was then brought in and docked using bipolar [place] in the left, scissors on the right with a tip up grasper in the 4th arm.   The white line of Toldt was incised and the left hemicolon was reflected medially. There was a nice plane here and Gerota fascia could be easily identified. The ureter and gonadal vein were then identified in the lower portion.  The ureter was grossly abnormal and dilated and tortuous as seen on CT scan.  The tail of Gerota was then traced superiorly towards the hilum. The gonadal vein was dissected out and followed superiorly toward the hilum. The hilum was then further dissected very carefully and precisely such that 2 distinct vessels were noted, the main hilum with artery and vein as well as a accessory lower pole.  Artery vein which was much smaller.  The dissection was then carried up inferiorly and a plane was created between the  adrenal gland and the middle aspect of the upper pole of the kidney. The splenorenal ligament was then incised and the spleen was mobilized superiorly as well. A plane was then created posterior to the kidney, and the kidney was retracted superiorly to place the hilum on stretch. At this point in time, a 60 mm vascular stapler was used to ligate the accessory lower pole vessels followed by the main hilum each en bloc.. There was no bleeding or complication associated with the stapling of the hilum. The  kidney was then further mobilized and the upper pole was eventually able to be freed.  Another staple load was used in the medial upper pole.  The tail of Gerota's was then freed up until the kidney was completely free from its attachments other than by ureter. The ureter was then stapled distally as it was too large to [facility].  At this point in time, the kidney and ureter were placed in a large endoscopic bag, which was brought in using the 12 mm port incision near the umbilicus. The hilum was then reinspected as well as the renal fossa and splenorenal ligament. There was no active bleeding noted. Additional hemostasis was achieved using Surgicel near the hilum as well as Surgiflo.   The remainder of the case was turned over to Dr. [name] to remove parts extract the specimen and repair of the hernia.  Please see his notes for details.  There were no complications in this case. All needle counts and instrument counts as well as laps were correct at the end of the case.      An assistant was required for this surgical procedure. The duties of the assistant included but were not limited to suctioning, passing suture, camera manipulation, retraction. This procedure would not be able to be performed without an assistant.   ____________________________ Claris Gladden, MD

## 2023-04-06 DIAGNOSIS — N1330 Unspecified hydronephrosis: Secondary | ICD-10-CM

## 2023-04-06 DIAGNOSIS — N261 Atrophy of kidney (terminal): Secondary | ICD-10-CM

## 2023-04-06 LAB — CBC
HCT: 35.2 % — ABNORMAL LOW (ref 39.0–52.0)
Hemoglobin: 11.9 g/dL — ABNORMAL LOW (ref 13.0–17.0)
MCH: 32.2 pg (ref 26.0–34.0)
MCHC: 33.8 g/dL (ref 30.0–36.0)
MCV: 95.4 fL (ref 80.0–100.0)
Platelets: 207 10*3/uL (ref 150–400)
RBC: 3.69 MIL/uL — ABNORMAL LOW (ref 4.22–5.81)
RDW: 13.3 % (ref 11.5–15.5)
WBC: 9.2 10*3/uL (ref 4.0–10.5)
nRBC: 0 % (ref 0.0–0.2)

## 2023-04-06 LAB — BASIC METABOLIC PANEL
Anion gap: 7 (ref 5–15)
BUN: 17 mg/dL (ref 8–23)
CO2: 25 mmol/L (ref 22–32)
Calcium: 8.5 mg/dL — ABNORMAL LOW (ref 8.9–10.3)
Chloride: 102 mmol/L (ref 98–111)
Creatinine, Ser: 1.18 mg/dL (ref 0.61–1.24)
GFR, Estimated: >60 mL/min (ref >60)
Glucose, Bld: 127 mg/dL — ABNORMAL HIGH (ref 70–99)
Potassium: 4.2 mmol/L (ref 3.5–5.1)
Sodium: 134 mmol/L — ABNORMAL LOW (ref 135–145)

## 2023-04-06 MED ORDER — FAMOTIDINE 20 MG PO TABS
20.0000 mg | ORAL_TABLET | Freq: Every day | ORAL | Status: DC
  Administered 2023-04-06 – 2023-04-07 (×2): 20 mg via ORAL
  Filled 2023-04-06 (×2): qty 1

## 2023-04-06 MED ORDER — CHLORHEXIDINE GLUCONATE CLOTH 2 % EX PADS
6.0000 | MEDICATED_PAD | Freq: Every day | CUTANEOUS | Status: DC
  Administered 2023-04-06: 6 via TOPICAL

## 2023-04-06 NOTE — Progress Notes (Signed)
Mobility Specialist - Progress Note   04/06/23 1000  Mobility  Activity Ambulated with assistance in hallway  Level of Assistance Standby assist, set-up cues, supervision of patient - no hands on  Assistive Device Front wheel walker  Distance Ambulated (ft) 160 ft  Activity Response Tolerated well  $Mobility charge 1 Mobility     Pre-mobility: 91 HR, 152/92 BP, 96% SpO2   Pt lying in bed upon arrival, utilizing RA. Completed bed mobility with minA with cueing for log roll technique. Pain at incision site 8/10 with movement. Dizzy (throughout session) and nauseous upon sitting. BP checked 155/102 then rechecked 152/92. STS with light assist and ambulation in hallway with minG. No LOB. 1 short standing rest break. Voiced mild SOB with activity. Pt returned to bed with assist onto LE. Pt left semi-supine with alarm set, needs in reach.    Filiberto Pinks Mobility Specialist 04/06/23, 10:41 AM

## 2023-04-06 NOTE — Plan of Care (Signed)

## 2023-04-06 NOTE — Progress Notes (Signed)
[facility] SURGICAL PROGRESS NOTE  Hospital Day(s): 1.   Post op day(s): 1 Day Post-Op.   Interval History:  Patient seen and examined No acute events or new complaints overnight.  Patient reports he is very sore this morning Having hiccups and an episode of emesis last night No fever/chills No new labs this AM - these are pending UO - 1320 ccs CLD; tolerating but not hungry No flatus Has not ambulated   Vital signs in last 24 hours: [min-max] current  Temp:  [97.2 F (36.2 C)-98.3 F (36.8 C)] 98.3 F (36.8 C) (09/24 0319) Pulse Rate:  [79-98] 88 (09/24 0319) Resp:  [12-20] 16 (09/24 0319) BP: (110-158)/(70-93) 130/80 (09/24 0319) SpO2:  [92 %-100 %] 98 % (09/24 0319)     Height: 5\' 11"  (180.3 cm) Weight: 80.7 kg BMI (Calculated): 24.84   Intake/Output last 2 shifts:  09/23 0701 - 09/24 0700 In: 3324.4 [I.V.:3124.4; IV Piggyback:200] Out: 1340 [Urine:1320; Blood:20]   Physical Exam:  Constitutional: alert, cooperative and no distress  Respiratory: breathing non-labored at rest  Cardiovascular: regular rate and sinus rhythm  Gastrointestinal: Soft, incisional tenderness expectedly, he is mildly distended and tympanic with percussion. No rebound/guarding.  Integumentary: Laparoscopic incisions are CDI with dermabond, early ecchymosis, no erythema or drainage   Labs:     Latest Ref Rng & Units 07/20/2022    8:20 AM 06/23/2021    9:33 AM 06/17/2020    8:25 AM  CBC  WBC 3.8 - 10.8 Thousand/uL 7.2  6.7  5.7   Hemoglobin 13.2 - 17.1 g/dL 16.1  09.6  04.5   Hematocrit 38.5 - 50.0 % 45.7  45.7  40.1   Platelets 140 - 400 Thousand/uL 291  225  235       Latest Ref Rng & Units 10/26/2022    8:05 AM 07/20/2022    8:20 AM 07/21/2021    8:54 AM  CMP  Glucose 65 - 99 mg/dL 409  811  914   BUN 7 - 25 mg/dL 29  30  28    Creatinine 0.70 - 1.28 mg/dL 7.82  9.56  2.13   Sodium 135 - 146 mmol/L 131  135  137   Potassium 3.5 - 5.3 mmol/L 5.2  5.2  5.1   Chloride  98 - 110 mmol/L 92  97  99   CO2 20 - 32 mmol/L 28  31  30    Calcium 8.6 - 10.3 mg/dL 9.8  08.6  9.7   Total Protein 6.1 - 8.1 g/dL  7.6    Total Bilirubin 0.2 - 1.2 mg/dL  0.6    AST 10 - 35 U/L  22    ALT 9 - 46 U/L  21       Imaging studies: No new pertinent imaging studies   Assessment/Plan:  77 y.o. male 1 Day Post-Op s/p primary repair of anterior abdominal wall hernia (3.4 cm) as well as concomitant robotic assisted laparoscopic left nephrectomy with urology.    - May be developing ileus given distension/tympany and frequent hiccups. Likely needs to continue CLD; low threshold for NPO if nausea/emesis worsen   - Defer foley management to urology; good UO - Follow up morning labs - Monitor abdominal examination; on-going bowel function    - Pain control prn; antiemetics prn - Mobilize; Encouraged him to get out of bed today  - Further management per primary service; We will be happy to remain available to assist with care however needed    -  Discharge Planning: From a hernia standpoint, will arrange follow up in 2-3 weeks. Post-operative instructions updated from a hernia repair standpoint.    All of the above findings and recommendations were discussed with the patient, patient's family (wife at bedside), and the medical team, and all of patient's and family's questions were answered to their expressed satisfaction.  -- Lynden Oxford, PA-C [facility] 04/06/2023, 6:42 AM M-F: 7am - 4pm

## 2023-04-06 NOTE — Discharge Instructions (Signed)
In addition to included general post-operative instructions for HERNIA REPAIR,  Diet: Resume home diet.   Activity: No heavy lifting >20 pounds (children, pets, laundry, garbage) or strenuous activity for 6 weeks, but light activity and walking are encouraged. Do not drive or drink alcohol if taking narcotic pain medications or having pain that might distract from driving.  Wound care: 2 days after surgery (09/25), you may shower/get incision wet with soapy water and pat dry (do not rub incisions), but no baths or submerging incision underwater until follow-up.   Call office [phone] - Dr Benson [place] Office) at any time if any questions, worsening pain, fevers/chills, bleeding, drainage from incision site, or other concerns.

## 2023-04-06 NOTE — Progress Notes (Signed)
Urology Inpatient Progress Note  Subjective: No acute events overnight.  He is afebrile, VSS. Creatinine down, 1.18.  Hemoglobin down, 11.9. He was kept on clear liquids this morning per gen surg due to concerns for early ileus, however as of midday he is passing flatus and diet was advanced.  He subsequently was able to eat and is tolerating p.o.  He has been ambulatory, but admits to feeling unsteady on his feet.  He requests to stay another night to his unsteadiness.  Pain has been well-controlled. Foley catheter removed in the morning and he was subsequently able to void.  Anti-infectives: Anti-infectives (From admission, onward)    Start     Dose/Rate Route Frequency Ordered Stop   04/05/23 1600  ceFAZolin (ANCEF) IVPB 1 g/50 mL premix        1 g 100 mL/hr over 30 Minutes Intravenous Every 8 hours 04/05/23 1310 04/06/23 0700   04/05/23 0616  ceFAZolin (ANCEF) IVPB 2g/100 mL premix        2 g 200 mL/hr over 30 Minutes Intravenous 30 min pre-op 04/05/23 6644 04/05/23 0803       Current Facility-Administered Medications  Medication Dose Route Frequency Provider Last Rate Last Admin   0.9 %  sodium chloride infusion   Intravenous Continuous [name], MD   Stopped at 04/06/23 1403   acetaminophen (TYLENOL) tablet 650 mg  650 mg Oral Q4H PRN [name], MD   650 mg at 04/06/23 0601   calcium carbonate (TUMS - dosed in mg elemental calcium) chewable tablet 400 mg of elemental calcium  400 mg of elemental calcium Oral TID PRN [name], MD   400 mg of elemental calcium at 04/05/23 1957   Chlorhexidine Gluconate Cloth 2 % PADS 6 each  6 each Topical Daily [name], MD   6 each at 04/06/23 0859   diphenhydrAMINE (BENADRYL) injection 12.5 mg  12.5 mg Intravenous Q6H PRN [name], MD       Or   diphenhydrAMINE (BENADRYL) 12.5 MG/5ML elixir 12.5 mg  12.5 mg Oral Q6H PRN [name], MD       docusate sodium (COLACE) capsule 100 mg  100 mg Oral BID [name], MD   100 mg at 04/06/23 0858   famotidine (PEPCID) tablet 20 mg  20 mg Oral Daily [name], MD   20 mg at 04/06/23 0858   heparin injection 5,000 Units  5,000 Units Subcutaneous Q8H [name], MD   5,000 Units at 04/06/23 1405   lisinopril (ZESTRIL) tablet 40 mg  40 mg Oral Daily [name], MD   40 mg at 04/06/23 0858   metoprolol tartrate (LOPRESSOR) tablet 25 mg  25 mg Oral BID [name], MD   25 mg at 04/06/23 0858   morphine (PF) 2 MG/ML injection 2-4 mg  2-4 mg Intravenous Q2H PRN [name], MD       ondansetron [facility]) injection 4 mg  4 mg Intravenous Q4H PRN [name], MD   4 mg at 04/06/23 0022   oxybutynin (DITROPAN-XL) 24 hr tablet 10 mg  10 mg Oral Daily [name], MD   10 mg at 04/06/23 0347   oxyCODONE (Oxy IR/ROXICODONE) immediate release tablet 5 mg  5 mg Oral Q4H PRN [name], MD       simvastatin (ZOCOR) tablet 40 mg  40 mg Oral QHS [name], MD   40 mg at 04/05/23 2200   traZODone (DESYREL) tablet 100 mg  100 mg Oral QHS  [name], MD   100 mg at 04/05/23 2200   zolpidem (AMBIEN) tablet 5 mg  5 mg Oral QHS PRN [name], MD         Objective: Vital signs in last 24 hours: Temp:  [97.4 F (36.3 C)-98.3 F (36.8 C)] 97.4 F (36.3 C) (09/24 1540) Pulse Rate:  [78-94] 78 (09/24 1540) Resp:  [14-16] 14 (09/24 1540) BP: (129-150)/(78-93) 150/82 (09/24 1540) SpO2:  [97 %-99 %] 97 % (09/24 1540)  Intake/Output from previous day: 09/23 0701 - 09/24 0700 In: 3324.4 [I.V.:3124.4; IV Piggyback:200] Out: 1340 [Urine:1320; Blood:20] Intake/Output this shift: Total I/O In: 1323.8 [I.V.:1323.8] Out: 850 [Urine:850]  Physical Exam Vitals and nursing note reviewed.  Constitutional:      General: He is not in acute distress.    Appearance: He is not ill-appearing, toxic-appearing or diaphoretic.  HENT:     Head: Normocephalic and atraumatic.  Pulmonary:     Effort: Pulmonary effort is normal. No  respiratory distress.  Abdominal:     Comments: Abdomen is soft with appropriate postoperative tenderness, no rigidity or rebound.  Incisions clean, dry, and intact with overlying surgical adhesive.  No incisional bruising or bleeding.  Skin:    General: Skin is warm and dry.  Neurological:     Mental Status: He is alert and oriented to person, place, and time.  Psychiatric:        Mood and Affect: Mood normal.        Behavior: Behavior normal.    Lab Results:  Recent Labs    04/06/23 0803  WBC 9.2  HGB 11.9*  HCT 35.2*  PLT 207   BMET Recent Labs    04/06/23 0803  NA 134*  K 4.2  CL 102  CO2 25  GLUCOSE 127*  BUN 17  CREATININE 1.18  CALCIUM 8.5*   Assessment & Plan: 77 year old male POD 1 from combined left robotic nephrectomy with Dr. [name] for management of left hydronephrosis and left renal atrophy and abdominal hernia repair with Dr. [name] for management of a reducible epigastric hernia.  Bowel function began to return this afternoon, diet was advanced, and he is tolerating p.o.  He is recovering well with normal postoperative changes on a.m. labs.  Will keep him an additional night but anticipate discharge tomorrow.  Carman Ching, PA-C 04/06/2023

## 2023-04-06 NOTE — Discharge Summary (Signed)
Date of admission: 04/05/2023  Date of discharge: 04/07/2023  Admission diagnosis: Left hydronephrosis, left renal atrophy, reducible epigastric hernia  Discharge diagnosis: Same as above  Secondary diagnoses:  Patient Active Problem List   Diagnosis Date Noted   Hydronephrosis, left 04/05/2023   Epigastric hernia 02/11/2023   History of nephrolithiasis 05/08/2019   Overweight (BMI 25.0-29.9) 04/04/2018   Former smoker 03/02/2017   Screening for colon cancer 03/02/2017   Elevated blood sugar 08/20/2016   Benign hypertension with CKD (chronic kidney disease) stage III (HCC) 04/15/2015   Hyperlipidemia 04/15/2015   History and Physical: For full details, please see admission history and physical. Briefly, Jeff Ryan is a 77 y.o. year old patient admitted on 04/05/2023 for scheduled combined left robotic nephrectomy with Dr. [name] and abdominal wall hernia repair with Dr. [name].    Hospital Course: Patient tolerated the procedure well.  He was then transferred to the floor after an uneventful PACU stay.  His hospital course was uncomplicated.  On POD#2 he had met discharge criteria: was eating a regular diet, was up and ambulating independently,  pain was well controlled, was voiding without a catheter, and was ready for discharge.  Physical exam: Constitutional:  Well nourished. Alert and oriented, No acute distress. HEENT: [place] AT, moist mucus membranes.  Trachea midline.   Cardiovascular: No clubbing, cyanosis, or edema. Respiratory: Normal respiratory effort, no increased work of breathing. GI: Abdomen is soft, non tender, mildly distended, no abdominal masses.  Incisions are clean and dry. GU: No CVA tenderness.  No bladder fullness or masses.   Neurologic: Grossly intact, no focal deficits, moving all 4 extremities. Psychiatric: Normal mood and affect.    Laboratory values:  Recent Labs    04/06/23 0803  WBC 9.2  HGB 11.9*  HCT 35.2*   Recent Labs     04/06/23 0803  NA 134*  K 4.2  CL 102  CO2 25  GLUCOSE 127*  BUN 17  CREATININE 1.18  CALCIUM 8.5*   No results for input(s): "LABPT", "INR" in the last 72 hours. No results for input(s): "LABURIN" in the last 72 hours. Results for orders placed or performed during the hospital encounter of 03/26/23  Urine Culture     Status: None   Collection Time: 03/26/23  2:13 PM   Specimen: Urine, Clean Catch  Result Value Ref Range Status   Specimen Description   Final    URINE, CLEAN CATCH Performed at [facility], [address] [place], [place] [address]    Special Requests   Final    NONE Performed at [facility], [address], [place], [place] [address]    Culture   Final    NO GROWTH Performed at [facility], [address], [place], [place] [address]    Report Status 03/27/2023 FINAL  Final    Disposition: Home  Discharge instruction: The patient was instructed to be ambulatory but told to refrain from heavy lifting or strenuous activity for the next six weeks.  He can drive after one week if he is no longer taking pain medications or having pain that may distract him from driving.  Discharge medications:  Allergies as of 04/07/2023   No Known Allergies      Medication List     TAKE these medications    apixaban 5 MG Tabs tablet Commonly known as: ELIQUIS Take 5 mg by mouth 2 (two) times daily.   docusate sodium 100 MG capsule Commonly known as:  COLACE Take 1 capsule (100 mg total) by mouth 2 (two) times daily.   lisinopril 40 MG tablet Commonly known as: ZESTRIL Take 1 tablet (40 mg total) by mouth daily.   metoprolol tartrate 25 MG tablet Commonly known as: LOPRESSOR Take 25 mg by mouth 2 (two) times daily.   oxyCODONE 5 MG immediate release tablet Commonly known as: Oxy IR/ROXICODONE Take 1 tablet (5 mg total) by mouth every 4 (four) hours as needed for moderate pain.   simvastatin 40 MG tablet Commonly known as:  ZOCOR TAKE 1 TABLET BY MOUTH DAILY  WITH SUPPER   traZODone 100 MG tablet Commonly known as: DESYREL Take 1 tablet (100 mg total) by mouth at bedtime.        Followup:  He has a follow-up with Dr. [name] and her Marden Noble the office on October 23 at 915   Follow-up Information     Campbell Lerner, MD. Go on 04/22/2023.   Specialty: General Surgery Why: Go to appointment on 10/10 at 900 AM Contact information: [address] [place] [address] [phone]

## 2023-04-07 LAB — BASIC METABOLIC PANEL
Anion gap: 5 (ref 5–15)
BUN: 16 mg/dL (ref 8–23)
CO2: 28 mmol/L (ref 22–32)
Calcium: 8.3 mg/dL — ABNORMAL LOW (ref 8.9–10.3)
Chloride: 102 mmol/L (ref 98–111)
Creatinine, Ser: 1.2 mg/dL (ref 0.61–1.24)
GFR, Estimated: >60 mL/min (ref >60)
Glucose, Bld: 110 mg/dL — ABNORMAL HIGH (ref 70–99)
Potassium: 4.3 mmol/L (ref 3.5–5.1)
Sodium: 135 mmol/L (ref 135–145)

## 2023-04-07 LAB — CBC
HCT: 30.5 % — ABNORMAL LOW (ref 39.0–52.0)
Hemoglobin: 10.6 g/dL — ABNORMAL LOW (ref 13.0–17.0)
MCH: 32.4 pg (ref 26.0–34.0)
MCHC: 34.8 g/dL (ref 30.0–36.0)
MCV: 93.3 fL (ref 80.0–100.0)
Platelets: 187 10*3/uL (ref 150–400)
RBC: 3.27 MIL/uL — ABNORMAL LOW (ref 4.22–5.81)
RDW: 13.4 % (ref 11.5–15.5)
WBC: 8 10*3/uL (ref 4.0–10.5)
nRBC: 0 % (ref 0.0–0.2)

## 2023-04-07 LAB — CBC WITH DIFFERENTIAL/PLATELET
Abs Immature Granulocytes: 0.03 10*3/uL (ref 0.00–0.07)
Basophils Absolute: 0 10*3/uL (ref 0.0–0.1)
Basophils Relative: 0 %
Eosinophils Absolute: 0 10*3/uL (ref 0.0–0.5)
Eosinophils Relative: 0 %
HCT: 33.2 % — ABNORMAL LOW (ref 39.0–52.0)
Hemoglobin: 11.5 g/dL — ABNORMAL LOW (ref 13.0–17.0)
Immature Granulocytes: 0 %
Lymphocytes Relative: 17 %
Lymphs Abs: 1.6 10*3/uL (ref 0.7–4.0)
MCH: 32.7 pg (ref 26.0–34.0)
MCHC: 34.6 g/dL (ref 30.0–36.0)
MCV: 94.3 fL (ref 80.0–100.0)
Monocytes Absolute: 0.6 10*3/uL (ref 0.1–1.0)
Monocytes Relative: 6 %
Neutro Abs: 7.6 10*3/uL (ref 1.7–7.7)
Neutrophils Relative %: 77 %
Platelets: 198 10*3/uL (ref 150–400)
RBC: 3.52 MIL/uL — ABNORMAL LOW (ref 4.22–5.81)
RDW: 13.6 % (ref 11.5–15.5)
WBC: 9.8 10*3/uL (ref 4.0–10.5)
nRBC: 0 % (ref 0.0–0.2)

## 2023-04-07 LAB — SURGICAL PATHOLOGY

## 2023-04-07 MED ORDER — DOCUSATE SODIUM 100 MG PO CAPS: 100.0000 mg | ORAL_CAPSULE | Freq: Two times a day (BID) | ORAL | 0 refills | Status: DC

## 2023-04-07 MED ORDER — OXYCODONE HCL 5 MG PO TABS: 5.0000 mg | ORAL_TABLET | ORAL | 0 refills | Status: DC | PRN

## 2023-04-07 NOTE — Progress Notes (Signed)
Discharge instructions were reviewed with patient and family. Questions were answered and encourage. IV were removed. Belongins collected by family.

## 2023-04-07 NOTE — Plan of Care (Signed)
  Problem: Education: Goal: Knowledge of the prescribed therapeutic regimen will improve Outcome: Progressing   Problem: Coping: Goal: Level of anxiety will decrease Outcome: Progressing

## 2023-04-07 NOTE — Plan of Care (Signed)
  Problem: Bowel/Gastric: Goal: Gastrointestinal status for postoperative course will improve Outcome: Progressing   Problem: Clinical Measurements: Goal: Postoperative complications will be avoided or minimized Outcome: Progressing   Problem: Urinary Elimination: Goal: Ability to avoid or minimize complications of infection will improve Outcome: Progressing   Problem: Health Behavior/Discharge Planning: Goal: Ability to manage health-related needs will improve Outcome: Progressing   Problem: Activity: Goal: Risk for activity intolerance will decrease Outcome: Progressing   Problem: Nutrition: Goal: Adequate nutrition will be maintained Outcome: Progressing   Problem: Pain Managment: Goal: General experience of comfort will improve Outcome: Progressing   Problem: Safety: Goal: Ability to remain free from injury will improve Outcome: Progressing   Problem: Skin Integrity: Goal: Risk for impaired skin integrity will decrease Outcome: Progressing

## 2023-04-19 ENCOUNTER — Other Ambulatory Visit: Payer: Self-pay | Admitting: Family Medicine

## 2023-04-19 DIAGNOSIS — E7849 Other hyperlipidemia: Secondary | ICD-10-CM

## 2023-04-19 NOTE — Telephone Encounter (Signed)
Medication Refill - Medication: simvastatin (ZOCOR) 40 MG tablet [[id number]] requesting a 90 day script  Has the patient contacted their pharmacy? Yes.   (Agent: If no, request that the patient contact the pharmacy for the refill. If patient does not wish to contact the pharmacy document the reason why and proceed with request.) (Agent: If yes, when and what did the pharmacy advise?)  Preferred Pharmacy (with phone number or street name):  [facility] [place], [place] - [address] Phone: [phone]  Fax: [phone]     Has the patient been seen for an appointment in the last year OR does the patient have an upcoming appointment? Yes.    Agent: Please be advised that RX refills may take up to 3 business days. We ask that you follow-up with your pharmacy.

## 2023-04-19 NOTE — Progress Notes (Signed)
[facility] POST-OP OFFICE VISIT  04/27/2023  HPI: Tyshaun Vinzant is a 77 y.o. male 15 days s/p hernia repair with utilization of the defect as extraction site.  He reports no abdominal pain, no bulge no issues.  Tolerating regular diet, denying nausea or vomiting.  No fevers no chills.  Doing well  Vital signs: BP 124/75 (BP Location: Left Arm, Patient Position: Sitting, Cuff Size: Large)   Pulse 86   Ht 5\' 11"  (1.803 m)   Wt 178 lb (80.7 kg)   SpO2 98%   BMI 24.83 kg/m    Physical Exam: Constitutional: Appears well, back at his baseline. Abdomen: Nondistended, soft and nontender aside from anticipated incisional areas. Skin: Incisions healing nicely.  Assessment/Plan: This is a 77 y.o. male 15 days s/p epigastric hernia repair  Patient Active Problem List   Diagnosis Date Noted   Hydronephrosis, left 04/05/2023   Epigastric hernia 02/11/2023   History of nephrolithiasis 05/08/2019   Overweight (BMI 25.0-29.9) 04/04/2018   Former smoker 03/02/2017   Screening for colon cancer 03/02/2017   Elevated blood sugar 08/20/2016   Benign hypertension with CKD (chronic kidney disease) stage III (HCC) 04/15/2015   Hyperlipidemia 04/15/2015    -We discussed avoiding certain weight lifting, bearing down and straining.  I believe he is well aware of his risk of recurrent hernia, but will follow-up as needed.   Campbell Lerner M.D., FACS 04/27/2023, 1:25 PM

## 2023-04-20 ENCOUNTER — Ambulatory Visit: Payer: Medicare Other | Admitting: Surgery

## 2023-04-20 ENCOUNTER — Encounter: Payer: Self-pay | Admitting: Surgery

## 2023-04-20 VITALS — BP 124/75 | HR 86 | Ht 71.0 in | Wt 178.0 lb

## 2023-04-20 DIAGNOSIS — K439 Ventral hernia without obstruction or gangrene: Secondary | ICD-10-CM

## 2023-04-20 DIAGNOSIS — Z09 Encounter for follow-up examination after completed treatment for conditions other than malignant neoplasm: Secondary | ICD-10-CM

## 2023-04-20 MED ORDER — SIMVASTATIN 40 MG PO TABS: 40.0000 mg | ORAL_TABLET | Freq: Every day | ORAL | 1 refills | Status: DC

## 2023-04-20 NOTE — Patient Instructions (Signed)

## 2023-04-20 NOTE — Telephone Encounter (Signed)
Requested Prescriptions  Pending Prescriptions Disp Refills   simvastatin (ZOCOR) 40 MG tablet 90 tablet 1    Sig: Take 1 tablet (40 mg total) by mouth daily with supper.     Cardiovascular:  Antilipid - Statins Failed - 04/19/2023 10:01 AM      Failed - Lipid Panel in normal range within the last 12 months    Cholesterol, Total  Date Value Ref Range Status  04/15/2015 144 100 - 199 mg/dL Final   Cholesterol  Date Value Ref Range Status  07/20/2022 165 <200 mg/dL Final   LDL Cholesterol (Calc)  Date Value Ref Range Status  07/20/2022 84 mg/dL (calc) Final    Comment:    Reference range: <100 . Desirable range <100 mg/dL for primary prevention;   <70 mg/dL for patients with CHD or diabetic patients  with > or = 2 CHD risk factors. Marland Kitchen LDL-C is now calculated using the Martin-Hopkins  calculation, which is a validated novel method providing  better accuracy than the Friedewald equation in the  estimation of LDL-C.  Horald Pollen et al. Lenox Ahr. 1610;960(45): 2061-2068  (http://education.[employer].com/faq/FAQ164)    HDL  Date Value Ref Range Status  07/20/2022 60 > OR = 40 mg/dL Final  40/98/1191 63 >47 mg/dL Final    Comment:    According to ATP-III Guidelines, HDL-C >59 mg/dL is considered a negative risk factor for CHD.    Triglycerides  Date Value Ref Range Status  07/20/2022 110 <150 mg/dL Final         Passed - Patient is not pregnant      Passed - Valid encounter within last 12 months    Recent Outpatient Visits           5 months ago Benign hypertension with CKD (chronic kidney disease) stage III [facility])   [place] [facility] [place], Netta Neat, DO   8 months ago Annual physical exam   [place] [facility] [name], DO   1 year ago Essential hypertension   [place] [facility] [name], DO   1 year ago Essential hypertension   [place] [facility] [name], DO   1 year ago Annual physical exam   [place] [facility] [name], DO       Future Appointments             In 2 weeks Althea Charon, Netta Neat, DO [place] [facility], [facility]

## 2023-04-22 ENCOUNTER — Encounter: Payer: Medicare Other | Admitting: Surgery

## 2023-05-04 ENCOUNTER — Ambulatory Visit: Payer: Medicare Other | Admitting: Family Medicine

## 2023-05-04 ENCOUNTER — Encounter: Payer: Self-pay | Admitting: Family Medicine

## 2023-05-04 ENCOUNTER — Other Ambulatory Visit: Payer: Self-pay | Admitting: Family Medicine

## 2023-05-04 VITALS — BP 130/74 | HR 72 | Ht 71.0 in | Wt 184.0 lb

## 2023-05-04 DIAGNOSIS — N182 Chronic kidney disease, stage 2 (mild): Secondary | ICD-10-CM

## 2023-05-04 DIAGNOSIS — Z23 Encounter for immunization: Secondary | ICD-10-CM

## 2023-05-04 DIAGNOSIS — R351 Nocturia: Secondary | ICD-10-CM

## 2023-05-04 DIAGNOSIS — E782 Mixed hyperlipidemia: Secondary | ICD-10-CM

## 2023-05-04 DIAGNOSIS — I129 Hypertensive chronic kidney disease with stage 1 through stage 4 chronic kidney disease, or unspecified chronic kidney disease: Secondary | ICD-10-CM

## 2023-05-04 DIAGNOSIS — N261 Atrophy of kidney (terminal): Secondary | ICD-10-CM

## 2023-05-04 DIAGNOSIS — Z125 Encounter for screening for malignant neoplasm of prostate: Secondary | ICD-10-CM

## 2023-05-04 DIAGNOSIS — Z905 Acquired absence of kidney: Secondary | ICD-10-CM

## 2023-05-04 DIAGNOSIS — F5101 Primary insomnia: Secondary | ICD-10-CM

## 2023-05-04 DIAGNOSIS — Z0000 Encounter for general adult medical examination without abnormal findings: Secondary | ICD-10-CM

## 2023-05-04 DIAGNOSIS — R739 Hyperglycemia, unspecified: Secondary | ICD-10-CM

## 2023-05-04 NOTE — Assessment & Plan Note (Signed)
Controlled HYPERTENSION - Home BP readings normal range  CKD II improved s/p L Nephrectomy after renal atrophy failure due to obstructed kidney stone  Off Thiazide  Plan:  1. Continue Lisinopril 40mg  daily 2. Encourage improved lifestyle - low sodium diet, regular exercise 3. Continue monitor BP outside office, bring readings to next visit, if persistently >140/90 or new symptoms notify office sooner  Goal to improve hydration, limit Advil PM can take more Tylenol

## 2023-05-04 NOTE — Progress Notes (Signed)
RBC 3.27 (L) 4.22 - 5.81 MIL/uL   Hemoglobin 10.6 (L) 13.0 - 17.0 g/dL   HCT 16.1 (L) 09.6 - 04.5 %   MCV 93.3 80.0 - 100.0 fL   MCH 32.4 26.0 - 34.0 pg   MCHC 34.8 30.0 - 36.0 g/dL   RDW 40.9 81.1 - 91.4 %   Platelets 187 150 - 400 K/uL   nRBC 0.0 0.0 - 0.2 %  Basic metabolic panel  Result Value Ref Range   Sodium 135 135 - 145 mmol/L   Potassium 4.3 3.5 - 5.1 mmol/L   Chloride 102 98 - 111 mmol/L   CO2 28 22 - 32 mmol/L   Glucose, Bld 110 (H) 70 - 99 mg/dL   BUN 16 8 - 23 mg/dL   Creatinine, Ser 7.82 0.61 - 1.24 mg/dL   Calcium 8.3 (L) 8.9 - 10.3 mg/dL   GFR, Estimated >95 >62 mL/min   Anion gap 5 5 - 15  CBC with Differential/Platelet  Result Value Ref Range   WBC 9.8 4.0 - 10.5 K/uL   RBC 3.52 (L) 4.22 - 5.81 MIL/uL   Hemoglobin 11.5 (L) 13.0 - 17.0 g/dL   HCT 13.0 (L) 86.5 - 78.4 %   MCV 94.3 80.0 - 100.0 fL   MCH 32.7 26.0 - 34.0 pg   MCHC 34.6 30.0 - 36.0 g/dL   RDW 69.6 29.5 - 28.4 %   Platelets 198 150 - 400 K/uL   nRBC 0.0 0.0 - 0.2 %   Neutrophils Relative % 77 %   Neutro Abs 7.6 1.7 - 7.7 K/uL   Lymphocytes Relative 17 %   Lymphs Abs 1.6 0.7 - 4.0 K/uL   Monocytes Relative 6 %   Monocytes Absolute 0.6 0.1 - 1.0 K/uL   Eosinophils Relative 0 %   Eosinophils Absolute 0.0 0.0 - 0.5 K/uL   Basophils Relative 0 %   Basophils Absolute 0.0 0.0 - 0.1 K/uL   Immature Granulocytes 0 %   Abs Immature Granulocytes 0.03 0.00 - 0.07 K/uL  ABO/Rh  Result Value Ref Range   ABO/RH(D)      O POS Performed at [facility], [address] [place], [place] [address]   Surgical pathology  Result Value Ref Range   SURGICAL PATHOLOGY      SURGICAL PATHOLOGY [facility] [address] [place], [place] [address] Telephone [phone] or [phone] Fax [phone]  REPORT OF SURGICAL PATHOLOGY   Accession #:  [id number] Patient Name: Jeff Ryan Visit # : [id number]  MRN: [id number] Physician: [name] DOB/Age 04-29-1946 (Age: 77) Gender: M Collected Date: 04/05/2023 Received Date: 04/05/2023  FINAL DIAGNOSIS       1. Kidney, resection, Left :       - DILATED CALYCES AND MARKEDLY ATROPHIC RENAL PARENCHYMA WITH CHRONIC      INTERSTITIAL NEPHRITIS CONSISTENT WITH END-STAGE POSTOBSTRUCTIVE NEPHROPATHY.      - SIMPLE CORTICAL CYSTS.      - INFLAMED UROTHELIUM LINING URETER.      - ONE REACTIVE LYMPH NODE.      - NEGATIVE FOR MALIGNANCY.       DATE SIGNED OUT: 04/07/2023 ELECTRONIC SIGNATURE : Oneita Kras Md, Delice Bison , Pathologist, Electronic Signature  MICROSCOPIC DESCRIPTION  CASE COMMENTS STAINS USED IN DIAGNOSIS: H&E H&E H&E H&E H&E    CL INICAL HISTORY  SPECIMEN(S) OBTAINED 1. Kidney, resection, Left  SPECIMEN COMMENTS: SPECIMEN CLINICAL INFORMATION: 1. s/p Severe left hydronephrosis, left renal atrophy; open epigastric  Subjective:    Patient ID: Jeff Ryan, male    DOB: 1945/08/06, 77 y.o.   MRN: [id number]  Jeff Ryan is a 77 y.o. male presenting on 05/04/2023 for Hypertension   HPI  Discussed the use of AI scribe software for clinical note transcription with the patient, who gave verbal consent to proceed.     The patient, a 77 year old with a history of kidney stones, presented for a follow-up visit after a series of significant health events over the past six months.   CHRONIC HYPERTENSION / CKD-II Monitoring Home BP controlled. Current Meds - Lisinopril 40mg , Metoprolol 25mg  BID - OFF HCTZ Reports good compliance, took meds today. Tolerating well, w/o complaints. Reduced nocturia He has had history of kidney stones for 30+ years, has had several removed.  Followed by Dr Suezanne Jacquet Nephrology initially for CKD II They did work up and Renal US, inconclusive, sent to Urology for CT and further work up Identified that LEFT kidney had severe hydronephrosis LEFT, with failed atrophic kidney Urology recommended surgical removal of kidney - He has post op follow-up with Urology tomorrow - Last lab 04/07/23, Creatinine 1.20 and GFR >60 The patient reported feeling better since the surgery, with improvements in sleep and urinary habits. The patient has been proactive in managing his health, adhering to a low-potassium diet to protect his remaining kidney. The patient expressed a desire to resume physical activity, specifically walking on a treadmill, pending clearance from the urologist. Denies CP, dyspnea, HA, edema, dizziness / lightheadedness   Atrial Fibrillation He was found to have Atrial Fibrillation, and could not get into [facility] fast enough He had ECHO completed and started on low dose Metoprolol and anticoagulation Eliquis to start after Renal surgery. Next visit   Epigastric Hernia S/p repair epigastric hernia The patient also underwent hernia repair surgery  during this period. The patient reported satisfaction with the outcome of the surgery and was surprised that the wound was sealed with a medical adhesive instead of stitches.   Overweight BMI >25 Previous reviewed elevated A1c CBGs: Not checking. Meds: Never on meds Currently on ACEi Lifestyle: Weight loss  Diet overhaul improved  Exercise (Goal to resume exercise) Denies hypoglycemia, polyuria, visual changes, numbness or tingling.    Insomnia Improved control on Trazodone 100mg  daily  Former smoker Quit 2008     Health Maintenance:   Flu shot today.   PSA 0.40 (negative, 07/2022), previously 0.32   Cologuard 04/2020 negative. We can reconsider at his Annual in 07/2023      05/04/2023    9:39 AM 11/02/2022    8:30 AM 07/27/2022    8:41 AM  Depression screen PHQ 2/9  Decreased Interest 0 0 0  Down, Depressed, Hopeless 0 0 0  PHQ - 2 Score 0 0 0  Altered sleeping 0 0 0  Tired, decreased energy 0 0 0  Change in appetite 0 0 0  Feeling bad or failure about yourself  0 0 0  Trouble concentrating 0 0 0  Moving slowly or fidgety/restless 0 0 0  Suicidal thoughts 0 0 0  PHQ-9 Score 0 0 0  Difficult doing work/chores   Not difficult at all    Social History   Tobacco Use   Smoking status: Former    Current packs/day: 0.00    Types: Cigarettes    Quit date: 01/11/2007    Years since quitting: 16.3   Smokeless tobacco: Former  Building services engineer status: Never Used  Subjective:    Patient ID: Jeff Ryan, male    DOB: 1945/08/06, 77 y.o.   MRN: [id number]  Jeff Ryan is a 77 y.o. male presenting on 05/04/2023 for Hypertension   HPI  Discussed the use of AI scribe software for clinical note transcription with the patient, who gave verbal consent to proceed.     The patient, a 77 year old with a history of kidney stones, presented for a follow-up visit after a series of significant health events over the past six months.   CHRONIC HYPERTENSION / CKD-II Monitoring Home BP controlled. Current Meds - Lisinopril 40mg , Metoprolol 25mg  BID - OFF HCTZ Reports good compliance, took meds today. Tolerating well, w/o complaints. Reduced nocturia He has had history of kidney stones for 30+ years, has had several removed.  Followed by Dr Suezanne Jacquet Nephrology initially for CKD II They did work up and Renal US, inconclusive, sent to Urology for CT and further work up Identified that LEFT kidney had severe hydronephrosis LEFT, with failed atrophic kidney Urology recommended surgical removal of kidney - He has post op follow-up with Urology tomorrow - Last lab 04/07/23, Creatinine 1.20 and GFR >60 The patient reported feeling better since the surgery, with improvements in sleep and urinary habits. The patient has been proactive in managing his health, adhering to a low-potassium diet to protect his remaining kidney. The patient expressed a desire to resume physical activity, specifically walking on a treadmill, pending clearance from the urologist. Denies CP, dyspnea, HA, edema, dizziness / lightheadedness   Atrial Fibrillation He was found to have Atrial Fibrillation, and could not get into [facility] fast enough He had ECHO completed and started on low dose Metoprolol and anticoagulation Eliquis to start after Renal surgery. Next visit   Epigastric Hernia S/p repair epigastric hernia The patient also underwent hernia repair surgery  during this period. The patient reported satisfaction with the outcome of the surgery and was surprised that the wound was sealed with a medical adhesive instead of stitches.   Overweight BMI >25 Previous reviewed elevated A1c CBGs: Not checking. Meds: Never on meds Currently on ACEi Lifestyle: Weight loss  Diet overhaul improved  Exercise (Goal to resume exercise) Denies hypoglycemia, polyuria, visual changes, numbness or tingling.    Insomnia Improved control on Trazodone 100mg  daily  Former smoker Quit 2008     Health Maintenance:   Flu shot today.   PSA 0.40 (negative, 07/2022), previously 0.32   Cologuard 04/2020 negative. We can reconsider at his Annual in 07/2023      05/04/2023    9:39 AM 11/02/2022    8:30 AM 07/27/2022    8:41 AM  Depression screen PHQ 2/9  Decreased Interest 0 0 0  Down, Depressed, Hopeless 0 0 0  PHQ - 2 Score 0 0 0  Altered sleeping 0 0 0  Tired, decreased energy 0 0 0  Change in appetite 0 0 0  Feeling bad or failure about yourself  0 0 0  Trouble concentrating 0 0 0  Moving slowly or fidgety/restless 0 0 0  Suicidal thoughts 0 0 0  PHQ-9 Score 0 0 0  Difficult doing work/chores   Not difficult at all    Social History   Tobacco Use   Smoking status: Former    Current packs/day: 0.00    Types: Cigarettes    Quit date: 01/11/2007    Years since quitting: 16.3   Smokeless tobacco: Former  Building services engineer status: Never Used  Subjective:    Patient ID: Jeff Ryan, male    DOB: 1945/08/06, 77 y.o.   MRN: [id number]  Jeff Ryan is a 77 y.o. male presenting on 05/04/2023 for Hypertension   HPI  Discussed the use of AI scribe software for clinical note transcription with the patient, who gave verbal consent to proceed.     The patient, a 77 year old with a history of kidney stones, presented for a follow-up visit after a series of significant health events over the past six months.   CHRONIC HYPERTENSION / CKD-II Monitoring Home BP controlled. Current Meds - Lisinopril 40mg , Metoprolol 25mg  BID - OFF HCTZ Reports good compliance, took meds today. Tolerating well, w/o complaints. Reduced nocturia He has had history of kidney stones for 30+ years, has had several removed.  Followed by Dr Suezanne Jacquet Nephrology initially for CKD II They did work up and Renal US, inconclusive, sent to Urology for CT and further work up Identified that LEFT kidney had severe hydronephrosis LEFT, with failed atrophic kidney Urology recommended surgical removal of kidney - He has post op follow-up with Urology tomorrow - Last lab 04/07/23, Creatinine 1.20 and GFR >60 The patient reported feeling better since the surgery, with improvements in sleep and urinary habits. The patient has been proactive in managing his health, adhering to a low-potassium diet to protect his remaining kidney. The patient expressed a desire to resume physical activity, specifically walking on a treadmill, pending clearance from the urologist. Denies CP, dyspnea, HA, edema, dizziness / lightheadedness   Atrial Fibrillation He was found to have Atrial Fibrillation, and could not get into [facility] fast enough He had ECHO completed and started on low dose Metoprolol and anticoagulation Eliquis to start after Renal surgery. Next visit   Epigastric Hernia S/p repair epigastric hernia The patient also underwent hernia repair surgery  during this period. The patient reported satisfaction with the outcome of the surgery and was surprised that the wound was sealed with a medical adhesive instead of stitches.   Overweight BMI >25 Previous reviewed elevated A1c CBGs: Not checking. Meds: Never on meds Currently on ACEi Lifestyle: Weight loss  Diet overhaul improved  Exercise (Goal to resume exercise) Denies hypoglycemia, polyuria, visual changes, numbness or tingling.    Insomnia Improved control on Trazodone 100mg  daily  Former smoker Quit 2008     Health Maintenance:   Flu shot today.   PSA 0.40 (negative, 07/2022), previously 0.32   Cologuard 04/2020 negative. We can reconsider at his Annual in 07/2023      05/04/2023    9:39 AM 11/02/2022    8:30 AM 07/27/2022    8:41 AM  Depression screen PHQ 2/9  Decreased Interest 0 0 0  Down, Depressed, Hopeless 0 0 0  PHQ - 2 Score 0 0 0  Altered sleeping 0 0 0  Tired, decreased energy 0 0 0  Change in appetite 0 0 0  Feeling bad or failure about yourself  0 0 0  Trouble concentrating 0 0 0  Moving slowly or fidgety/restless 0 0 0  Suicidal thoughts 0 0 0  PHQ-9 Score 0 0 0  Difficult doing work/chores   Not difficult at all    Social History   Tobacco Use   Smoking status: Former    Current packs/day: 0.00    Types: Cigarettes    Quit date: 01/11/2007    Years since quitting: 16.3   Smokeless tobacco: Former  Building services engineer status: Never Used  Subjective:    Patient ID: Jeff Ryan, male    DOB: 1945/08/06, 77 y.o.   MRN: [id number]  Jeff Ryan is a 77 y.o. male presenting on 05/04/2023 for Hypertension   HPI  Discussed the use of AI scribe software for clinical note transcription with the patient, who gave verbal consent to proceed.     The patient, a 77 year old with a history of kidney stones, presented for a follow-up visit after a series of significant health events over the past six months.   CHRONIC HYPERTENSION / CKD-II Monitoring Home BP controlled. Current Meds - Lisinopril 40mg , Metoprolol 25mg  BID - OFF HCTZ Reports good compliance, took meds today. Tolerating well, w/o complaints. Reduced nocturia He has had history of kidney stones for 30+ years, has had several removed.  Followed by Dr Suezanne Jacquet Nephrology initially for CKD II They did work up and Renal US, inconclusive, sent to Urology for CT and further work up Identified that LEFT kidney had severe hydronephrosis LEFT, with failed atrophic kidney Urology recommended surgical removal of kidney - He has post op follow-up with Urology tomorrow - Last lab 04/07/23, Creatinine 1.20 and GFR >60 The patient reported feeling better since the surgery, with improvements in sleep and urinary habits. The patient has been proactive in managing his health, adhering to a low-potassium diet to protect his remaining kidney. The patient expressed a desire to resume physical activity, specifically walking on a treadmill, pending clearance from the urologist. Denies CP, dyspnea, HA, edema, dizziness / lightheadedness   Atrial Fibrillation He was found to have Atrial Fibrillation, and could not get into [facility] fast enough He had ECHO completed and started on low dose Metoprolol and anticoagulation Eliquis to start after Renal surgery. Next visit   Epigastric Hernia S/p repair epigastric hernia The patient also underwent hernia repair surgery  during this period. The patient reported satisfaction with the outcome of the surgery and was surprised that the wound was sealed with a medical adhesive instead of stitches.   Overweight BMI >25 Previous reviewed elevated A1c CBGs: Not checking. Meds: Never on meds Currently on ACEi Lifestyle: Weight loss  Diet overhaul improved  Exercise (Goal to resume exercise) Denies hypoglycemia, polyuria, visual changes, numbness or tingling.    Insomnia Improved control on Trazodone 100mg  daily  Former smoker Quit 2008     Health Maintenance:   Flu shot today.   PSA 0.40 (negative, 07/2022), previously 0.32   Cologuard 04/2020 negative. We can reconsider at his Annual in 07/2023      05/04/2023    9:39 AM 11/02/2022    8:30 AM 07/27/2022    8:41 AM  Depression screen PHQ 2/9  Decreased Interest 0 0 0  Down, Depressed, Hopeless 0 0 0  PHQ - 2 Score 0 0 0  Altered sleeping 0 0 0  Tired, decreased energy 0 0 0  Change in appetite 0 0 0  Feeling bad or failure about yourself  0 0 0  Trouble concentrating 0 0 0  Moving slowly or fidgety/restless 0 0 0  Suicidal thoughts 0 0 0  PHQ-9 Score 0 0 0  Difficult doing work/chores   Not difficult at all    Social History   Tobacco Use   Smoking status: Former    Current packs/day: 0.00    Types: Cigarettes    Quit date: 01/11/2007    Years since quitting: 16.3   Smokeless tobacco: Former  Building services engineer status: Never Used  Subjective:    Patient ID: Jeff Ryan, male    DOB: 1945/08/06, 77 y.o.   MRN: [id number]  Jeff Ryan is a 77 y.o. male presenting on 05/04/2023 for Hypertension   HPI  Discussed the use of AI scribe software for clinical note transcription with the patient, who gave verbal consent to proceed.     The patient, a 77 year old with a history of kidney stones, presented for a follow-up visit after a series of significant health events over the past six months.   CHRONIC HYPERTENSION / CKD-II Monitoring Home BP controlled. Current Meds - Lisinopril 40mg , Metoprolol 25mg  BID - OFF HCTZ Reports good compliance, took meds today. Tolerating well, w/o complaints. Reduced nocturia He has had history of kidney stones for 30+ years, has had several removed.  Followed by Dr Suezanne Jacquet Nephrology initially for CKD II They did work up and Renal US, inconclusive, sent to Urology for CT and further work up Identified that LEFT kidney had severe hydronephrosis LEFT, with failed atrophic kidney Urology recommended surgical removal of kidney - He has post op follow-up with Urology tomorrow - Last lab 04/07/23, Creatinine 1.20 and GFR >60 The patient reported feeling better since the surgery, with improvements in sleep and urinary habits. The patient has been proactive in managing his health, adhering to a low-potassium diet to protect his remaining kidney. The patient expressed a desire to resume physical activity, specifically walking on a treadmill, pending clearance from the urologist. Denies CP, dyspnea, HA, edema, dizziness / lightheadedness   Atrial Fibrillation He was found to have Atrial Fibrillation, and could not get into [facility] fast enough He had ECHO completed and started on low dose Metoprolol and anticoagulation Eliquis to start after Renal surgery. Next visit   Epigastric Hernia S/p repair epigastric hernia The patient also underwent hernia repair surgery  during this period. The patient reported satisfaction with the outcome of the surgery and was surprised that the wound was sealed with a medical adhesive instead of stitches.   Overweight BMI >25 Previous reviewed elevated A1c CBGs: Not checking. Meds: Never on meds Currently on ACEi Lifestyle: Weight loss  Diet overhaul improved  Exercise (Goal to resume exercise) Denies hypoglycemia, polyuria, visual changes, numbness or tingling.    Insomnia Improved control on Trazodone 100mg  daily  Former smoker Quit 2008     Health Maintenance:   Flu shot today.   PSA 0.40 (negative, 07/2022), previously 0.32   Cologuard 04/2020 negative. We can reconsider at his Annual in 07/2023      05/04/2023    9:39 AM 11/02/2022    8:30 AM 07/27/2022    8:41 AM  Depression screen PHQ 2/9  Decreased Interest 0 0 0  Down, Depressed, Hopeless 0 0 0  PHQ - 2 Score 0 0 0  Altered sleeping 0 0 0  Tired, decreased energy 0 0 0  Change in appetite 0 0 0  Feeling bad or failure about yourself  0 0 0  Trouble concentrating 0 0 0  Moving slowly or fidgety/restless 0 0 0  Suicidal thoughts 0 0 0  PHQ-9 Score 0 0 0  Difficult doing work/chores   Not difficult at all    Social History   Tobacco Use   Smoking status: Former    Current packs/day: 0.00    Types: Cigarettes    Quit date: 01/11/2007    Years since quitting: 16.3   Smokeless tobacco: Former  Building services engineer status: Never Used

## 2023-05-04 NOTE — Patient Instructions (Addendum)
Thank you for coming to the office today.  Keep up treatment plan  Continue BP medications  Future consider dose reduction Lisinopril  Keep up with Specialists  Continue Atrial Fibrillation treatment plan and consider Electrocardioversion, let me know if need to switch to other Cardiology office.  Consider RSV vaccine at pharmacy. Age 77+   DUE for FASTING BLOOD WORK (no food or drink after midnight before the lab appointment, only water or coffee without cream/sugar on the morning of)  SCHEDULE "Lab Only" visit in the morning at the clinic for lab draw in 3 MONTHS   - Make sure Lab Only appointment is at about 1 week before your next appointment, so that results will be available  For Lab Results, once available within 2-3 days of blood draw, you can can log in to MyChart online to view your results and a brief explanation. Also, we can discuss results at next follow-up visit.   Please schedule a Follow-up Appointment to: Return in about 3 months (around 08/04/2023) for 3 month fasting lab only then 1 week later Annual Physical.  If you have any other questions or concerns, please feel free to call the office or send a message through MyChart. You may also schedule an earlier appointment if necessary.  Additionally, you may be receiving a survey about your experience at our office within a few days to 1 week by e-mail or mail. We value your feedback.  Saralyn Pilar, DO [facility], [place]

## 2023-05-05 ENCOUNTER — Ambulatory Visit: Payer: Medicare Other | Admitting: Urology

## 2023-05-05 ENCOUNTER — Encounter: Payer: Self-pay | Admitting: Urology

## 2023-05-05 VITALS — BP 143/87 | HR 75 | Ht 71.0 in | Wt 178.0 lb

## 2023-05-05 DIAGNOSIS — Z905 Acquired absence of kidney: Secondary | ICD-10-CM

## 2023-05-05 NOTE — Progress Notes (Signed)
I, Jeff Ryan, acting as a scribe for Jeff Scotland, MD., have documented all relevant documentation on the behalf of Jeff Scotland, MD, as directed by Jeff Scotland, MD while in the presence of Jeff Scotland, MD.  05/05/2023 12:57 PM   Jeff Ryan 1946-07-03 [id number]  Referring provider: [name], DO [address] [place],  [place] [address]  Chief Complaint  Patient presents with   Follow-up   Routine Post Op    HPI: 77 year old male with severe left renal atrophy secondary to chronic obstruction secondary to the left distal ureteral stone.   He ultimately elected left simple nephrectomy at which time his abdominal wall hernia which was also symptomatic was addressed.  Surgical pathology was consistent with a dilated, markedly atrophic renal pelvis with chronic interstitial nephritis consistent with end-stage post-obstructive nephropathy. Inflamed urothelial lining and one reactive lymph node. There was no evidence of malignancy. His pre-procedure creatinine was actually 1.59 and it had improved at the time of discharge to 1.2   He reports that he has done extremely well.  He saw Dr. [name] last week who felt like his abdominal incisions were healing well.  He is anxious to get back to full activity.  He feels great.   PMH: Past Medical History:  Diagnosis Date   Aortic atherosclerosis (HCC)    Atrial fibrillation with slow ventricular response (HCC) 03/26/2023   a.) noted on preop ECG 03/26/2023; b.) CHA2DS2-VASc: 4 (age x 2, HTN, vascular disease history); rate/rhythm maintained on oral metoprolol tartrate; chronically anticoagulated using standard dose apixaban (will start DOAC postop once cleared by surgery on 04/05/2023).   Atrophy of kidney    Dyslipidemia    Epigastric hernia    Essential hypertension    Insomnia    Nephrolithiasis    On apixaban therapy 04/01/2023   Pre-diabetes    Systolic murmur 04/01/2023   a.) grade II/VI; mitral  area   Umbilical hernia     Surgical History: Past Surgical History:  Procedure Laterality Date   CATARACT EXTRACTION, BILATERAL     left 10/26/2014 right 10/12/2014   EPIGASTRIC HERNIA REPAIR N/A 04/05/2023   Procedure: HERNIA REPAIR EPIGASTRIC ADULT;  Surgeon: Campbell Lerner, MD;  Location: [facility] ORS;  Service: General;  Laterality: N/A;   ROBOT ASSISTED LAPAROSCOPIC NEPHRECTOMY Left 04/05/2023   Procedure: XI ROBOTIC ASSISTED LAPAROSCOPIC NEPHRECTOMY;  Surgeon: Jeff Scotland, MD;  Location: [facility] ORS;  Service: Urology;  Laterality: Left;   TONSILLECTOMY AND ADENOIDECTOMY     US ECHOCARDIOGRAPHY     10/2014   VASECTOMY      Home Medications:  Allergies as of 05/05/2023   No Known Allergies      Medication List        Accurate as of May 05, 2023 12:57 PM. If you have any questions, ask your nurse or doctor.          apixaban 5 MG Tabs tablet Commonly known as: ELIQUIS Take 5 mg by mouth 2 (two) times daily.   docusate sodium 100 MG capsule Commonly known as: COLACE Take 1 capsule (100 mg total) by mouth 2 (two) times daily.   lisinopril 40 MG tablet Commonly known as: ZESTRIL Take 1 tablet (40 mg total) by mouth daily.   metoprolol tartrate 25 MG tablet Commonly known as: LOPRESSOR Take 1 tablet by mouth 2 (two) times daily.   simvastatin 40 MG tablet Commonly known as: ZOCOR Take 1 tablet (40 mg total) by mouth daily with supper.  traZODone 100 MG tablet Commonly known as: DESYREL Take 1 tablet (100 mg total) by mouth at bedtime.        Allergies: No Known Allergies  Family History: Family History  Problem Relation Age of Onset   Heart disease Brother    Heart attack Brother        x 3   Prostate cancer Neg Hx    Colon cancer Neg Hx     Social History:  reports that he quit smoking about 16 years ago. His smoking use included cigarettes. He has quit using smokeless tobacco. He reports current alcohol use of about 7.0 standard drinks of  alcohol per week. He reports that he does not use drugs.   Physical Exam: BP (!) 143/87   Pulse 75   Ht 5\' 11"  (1.803 m)   Wt 178 lb (80.7 kg)   BMI 24.83 kg/m   Constitutional:  Alert and oriented, No acute distress. HEENT: [place] AT, moist mucus membranes.  Trachea midline, no masses. Neurologic: Grossly intact, no focal deficits, moving all 4 extremities. Abdomen: Wounds healing well, surgical glue remains in place.  No hernias. Psychiatric: Normal mood and affect.   Assessment & Plan:    1. Status post nephrectomy He is free to return to his normal activities such as walking 2 miles on the treadmill and riding the lawn mower.  We discussed the importance of maintaining the health of his kidney.  He need to stay hydrated to avoid putting strain on his remaining kidney.   We also discussed which medications to avoid moving foward. He need to avoid anti-inflammatory medication such as ibruprofen, Aleve, and Motrin.    He will have his labs drawn with Dr. Althea Charon  and also and also is following up with his nephrologist, Dr. Ivor Messier potty and as such, will not draw additional labs today.  Return if symptoms worsen or fail to improve.  I have reviewed the above documentation for accuracy and completeness, and I agree with the above.   Jeff Scotland, MD  [facility] [address], [address] [place], [place] [address] [phone]

## 2023-06-04 ENCOUNTER — Ambulatory Visit: Payer: Medicare Other | Admitting: Cardiology

## 2023-06-07 ENCOUNTER — Other Ambulatory Visit: Payer: Self-pay | Admitting: Family Medicine

## 2023-06-07 DIAGNOSIS — F5101 Primary insomnia: Secondary | ICD-10-CM

## 2023-06-08 NOTE — Telephone Encounter (Signed)
Requested Prescriptions  Pending Prescriptions Disp Refills   traZODone (DESYREL) 100 MG tablet [Pharmacy Med Name: TRAZODONE 100 MG TABLET] 90 tablet 0    Sig: Take 1 tablet (100 mg total) by mouth at bedtime.     Psychiatry: Antidepressants - Serotonin Modulator Passed - 06/07/2023  1:45 PM      Passed - Valid encounter within last 6 months    Recent Outpatient Visits           1 month ago Benign hypertension with CKD (chronic kidney disease), stage II   [place] [facility] [place], Netta Neat, DO   7 months ago Benign hypertension with CKD (chronic kidney disease) stage III [facility])   [place] [facility] [name], DO   10 months ago Annual physical exam   [place] [facility] [name], DO   1 year ago Essential hypertension   [place] [facility] [name], DO   1 year ago Essential hypertension   [place] [facility] [name], DO       Future Appointments             In 1 month Ag[place]Et[place], Arlys John, MD [facility] [place]   In 2 months Althea Charon, Netta Neat, DO [place] [facility]

## 2023-06-15 DIAGNOSIS — H353131 Nonexudative age-related macular degeneration, bilateral, early dry stage: Secondary | ICD-10-CM | POA: Diagnosis not present

## 2023-06-15 DIAGNOSIS — H40003 Preglaucoma, unspecified, bilateral: Secondary | ICD-10-CM | POA: Diagnosis not present

## 2023-06-15 DIAGNOSIS — Z961 Presence of intraocular lens: Secondary | ICD-10-CM | POA: Diagnosis not present

## 2023-06-19 IMAGING — CT CT NECK W/ CM
4 series · 15 of 33 positions shown, 18 images · IV contrast (agent unspecified)
Comparison: None.

CLINICAL DATA: Right parotid mass

EXAM:
CT NECK WITH CONTRAST
TECHNIQUE: Multidetector CT imaging of the neck was performed using the
standard protocol following the bolus administration of intravenous
contrast.

[Series 3: neck 2.0 st · axial · 0.43mm/px · z∈[-259,-217]mm · 2 of 127 slices shown (1 of 3)]
[im 22/127  bone]
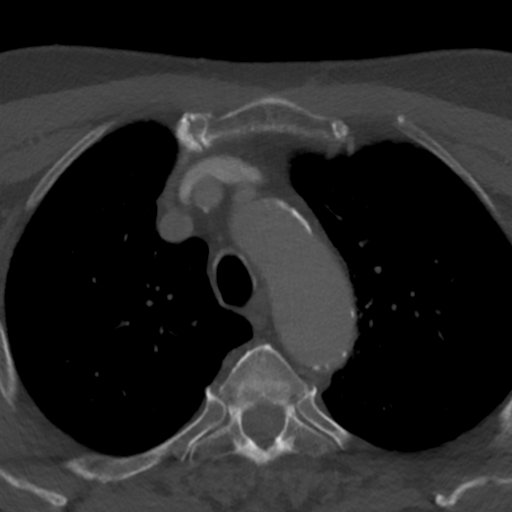
[im 43/127  bone]
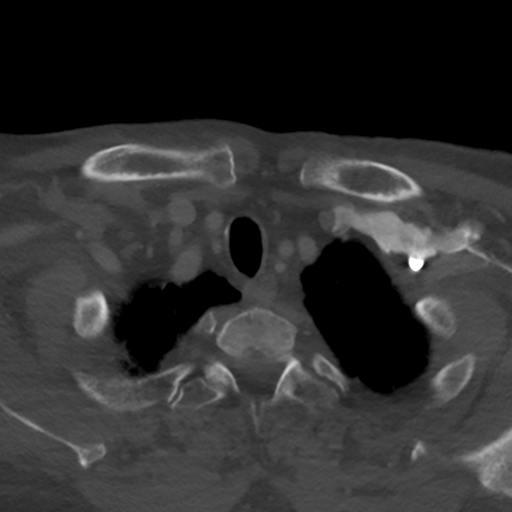

[Series 5: neck 2.0 st · sagittal · 0.49mm/px · 5 of 102 slices shown, 6 images (2 of 3)]
[im 34/102  bone]
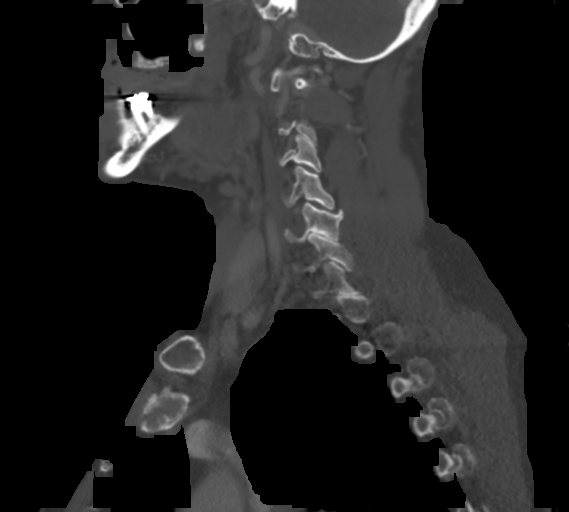
[im 43/102  bone]
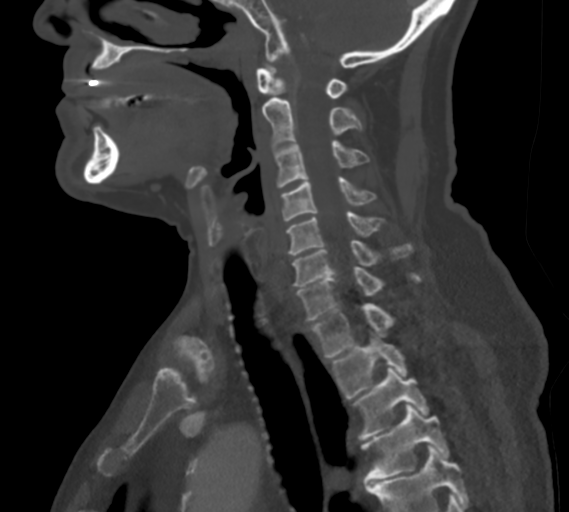
[im 51/102  soft-tissue]
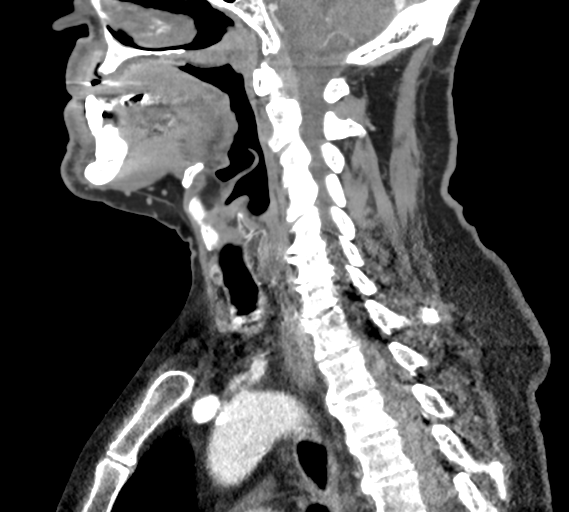
[im 51/102  bone]
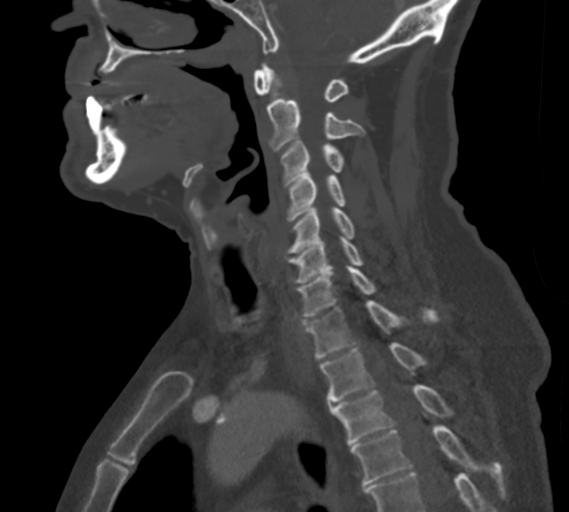
[im 59/102  bone]
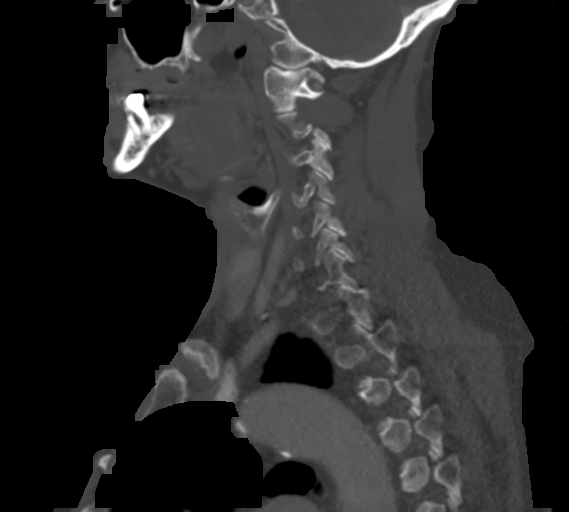
[im 68/102  bone]
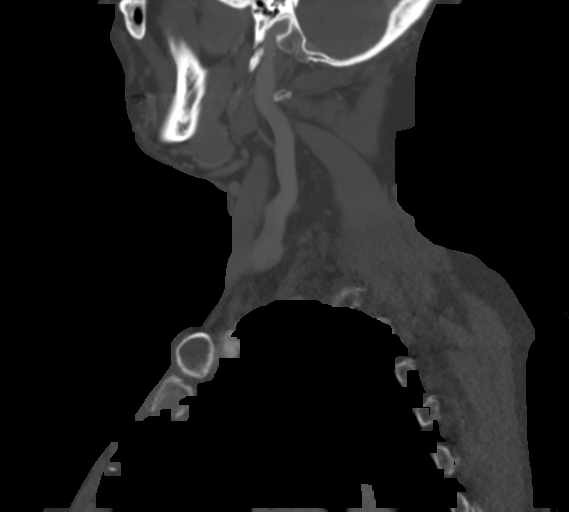

[Series 6: neck 2.0 st · coronal · 0.49mm/px · 3 of 101 slices shown (3 of 3)]
[im 21/101  bone]
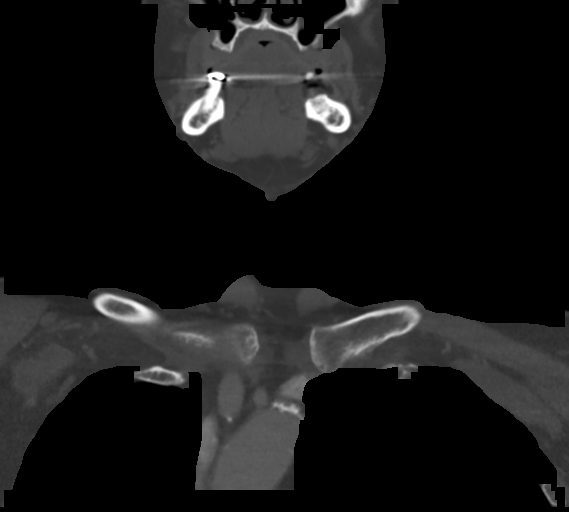
[im 41/101  bone]
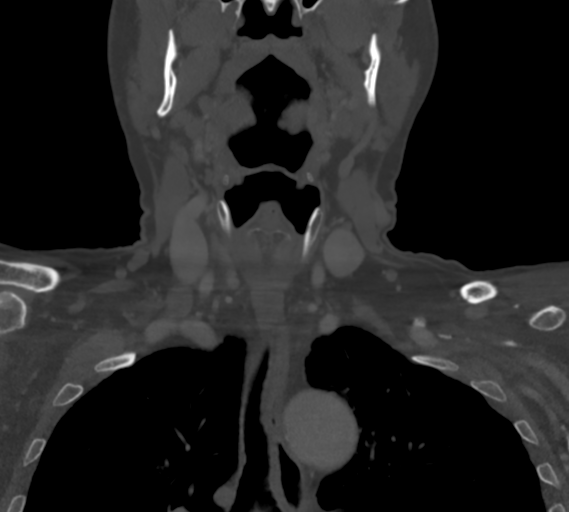
[im 61/101  bone]
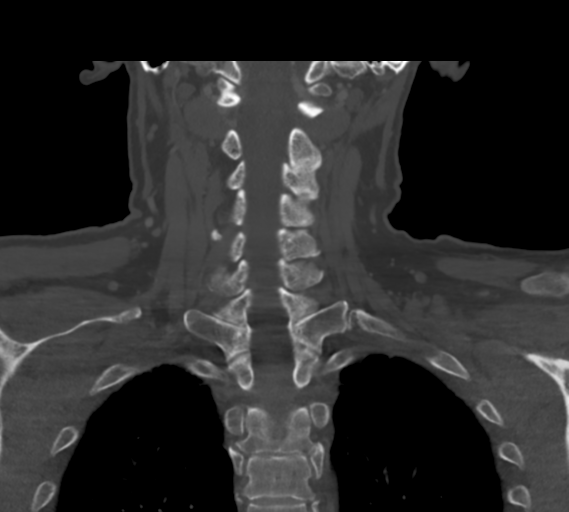

[Series 7: neck 2.0 st orthogonal · axial · 0.39mm/px · z∈[-278,-114]mm · 5 of 126 slices shown, 7 images]
[im 21/126  soft-tissue]
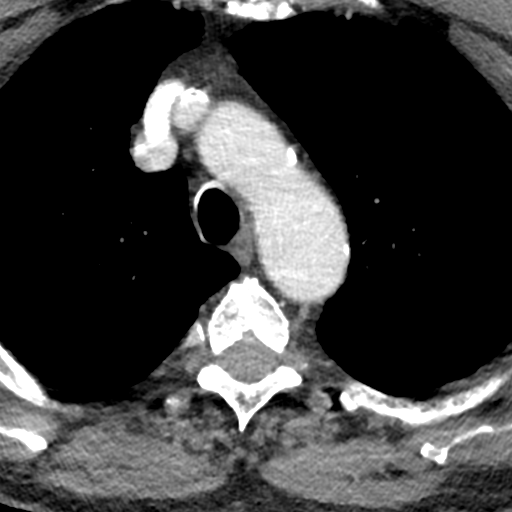
[im 21/126  bone]
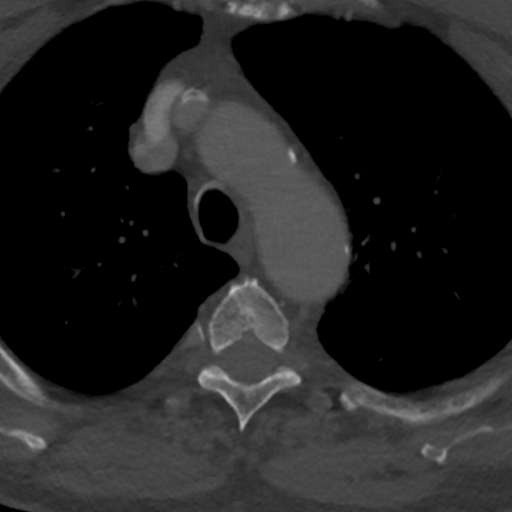
[im 42/126  bone]
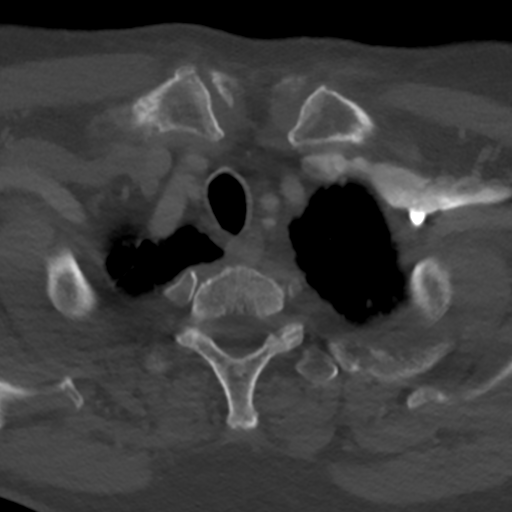
[im 63/126  bone]
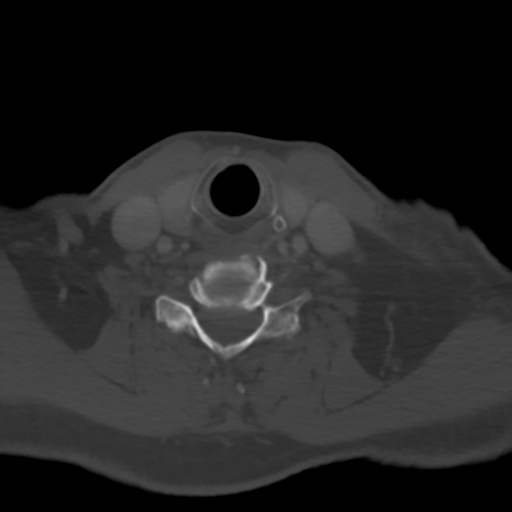
[im 84/126  bone]
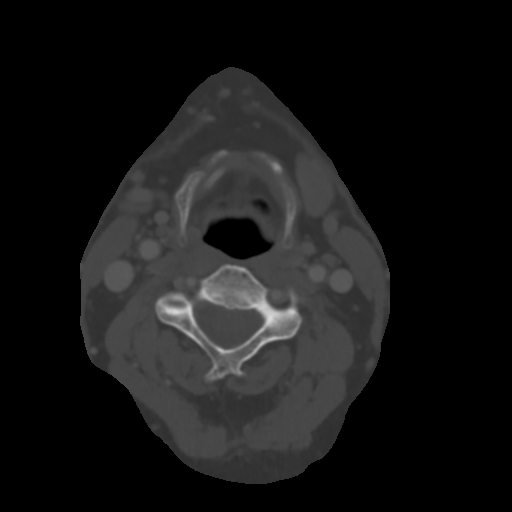
[im 105/126  soft-tissue]
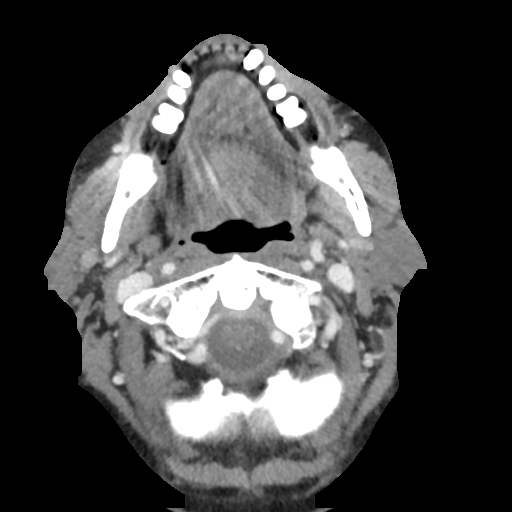
[im 105/126  bone]
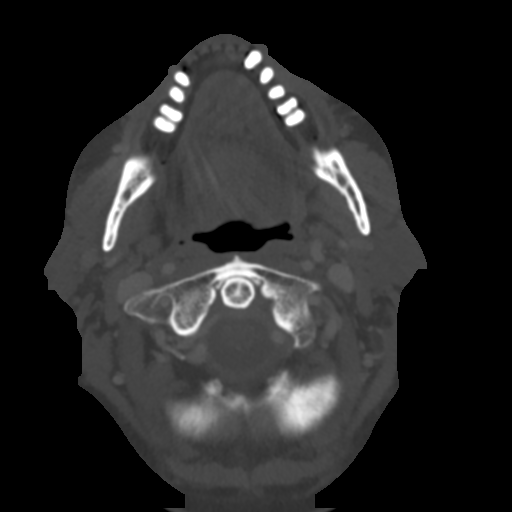

[15 of 33 positions shown; findings below may reference images not displayed]

RADIATION DOSE REDUCTION: This exam was performed according to the
departmental dose-optimization program which includes automated
exposure control, adjustment of the mA and/or kV according to
patient size and/or use of iterative reconstruction technique.

CONTRAST:  75mL [id number]-[id number] IOPAMIDOL ([id number]-[id number]) INJECTION 61%
FINDINGS: Pharynx and larynx: Unremarkable.  No mass or swelling.

Salivary glands: There is a 1.7 x 1.2 x 1.3 cm low-density lesion of
the superficial right parotid. Few small bilateral parotid stones.
Submandibular glands are unremarkable.

Thyroid: Normal.

Lymph nodes: No enlarged or abnormal density nodes.

Vascular: Major neck vessels are patent. Minor calcified plaque at
the ICA origins.

Limited intracranial: No abnormal enhancement.

Visualized orbits: Not included.

Mastoids and visualized paranasal sinuses: No significant
opacification.

Skeleton: Degenerative changes of the cervical spine primarily at
C6-C7.

Upper chest: Emphysema.

Other: Skin marker overlies right parotid lesion.
IMPRESSION: 1.7 cm low-density superficial right parotid lesion. Differential
considerations include sialocele, Warthin tumor, and necrotic node
if there is history of skin cancer.

## 2023-06-22 DIAGNOSIS — N1831 Chronic kidney disease, stage 3a: Secondary | ICD-10-CM | POA: Diagnosis not present

## 2023-06-22 DIAGNOSIS — R7303 Prediabetes: Secondary | ICD-10-CM | POA: Diagnosis not present

## 2023-06-22 DIAGNOSIS — I10 Essential (primary) hypertension: Secondary | ICD-10-CM | POA: Diagnosis not present

## 2023-06-22 DIAGNOSIS — E785 Hyperlipidemia, unspecified: Secondary | ICD-10-CM | POA: Diagnosis not present

## 2023-06-22 DIAGNOSIS — N200 Calculus of kidney: Secondary | ICD-10-CM | POA: Diagnosis not present

## 2023-06-30 DIAGNOSIS — E785 Hyperlipidemia, unspecified: Secondary | ICD-10-CM | POA: Diagnosis not present

## 2023-06-30 DIAGNOSIS — R7303 Prediabetes: Secondary | ICD-10-CM | POA: Diagnosis not present

## 2023-06-30 DIAGNOSIS — Z9889 Other specified postprocedural states: Secondary | ICD-10-CM | POA: Diagnosis not present

## 2023-06-30 DIAGNOSIS — N1831 Chronic kidney disease, stage 3a: Secondary | ICD-10-CM | POA: Diagnosis not present

## 2023-06-30 DIAGNOSIS — I10 Essential (primary) hypertension: Secondary | ICD-10-CM | POA: Diagnosis not present

## 2023-06-30 DIAGNOSIS — N200 Calculus of kidney: Secondary | ICD-10-CM | POA: Diagnosis not present

## 2023-06-30 DIAGNOSIS — G4709 Other insomnia: Secondary | ICD-10-CM | POA: Diagnosis not present

## 2023-06-30 DIAGNOSIS — R3129 Other microscopic hematuria: Secondary | ICD-10-CM | POA: Diagnosis not present

## 2023-06-30 DIAGNOSIS — N1330 Unspecified hydronephrosis: Secondary | ICD-10-CM | POA: Diagnosis not present

## 2023-06-30 DIAGNOSIS — N281 Cyst of kidney, acquired: Secondary | ICD-10-CM | POA: Diagnosis not present

## 2023-06-30 DIAGNOSIS — Z905 Acquired absence of kidney: Secondary | ICD-10-CM | POA: Diagnosis not present

## 2023-07-07 IMAGING — US IR BIOPSY CORE SALIVARY GLAND
1 series · 4 of 4 positions shown · non-contrast
Comparison: none

CLINICAL DATA: Palpable lesion of the right parotid gland measuring
approximately 1.7 cm in maximum diameter by CT.

[Series 1: ir biopsy core salivary gland · 0.04mm/px · 4 of 4 slices shown]
[im 1/4]
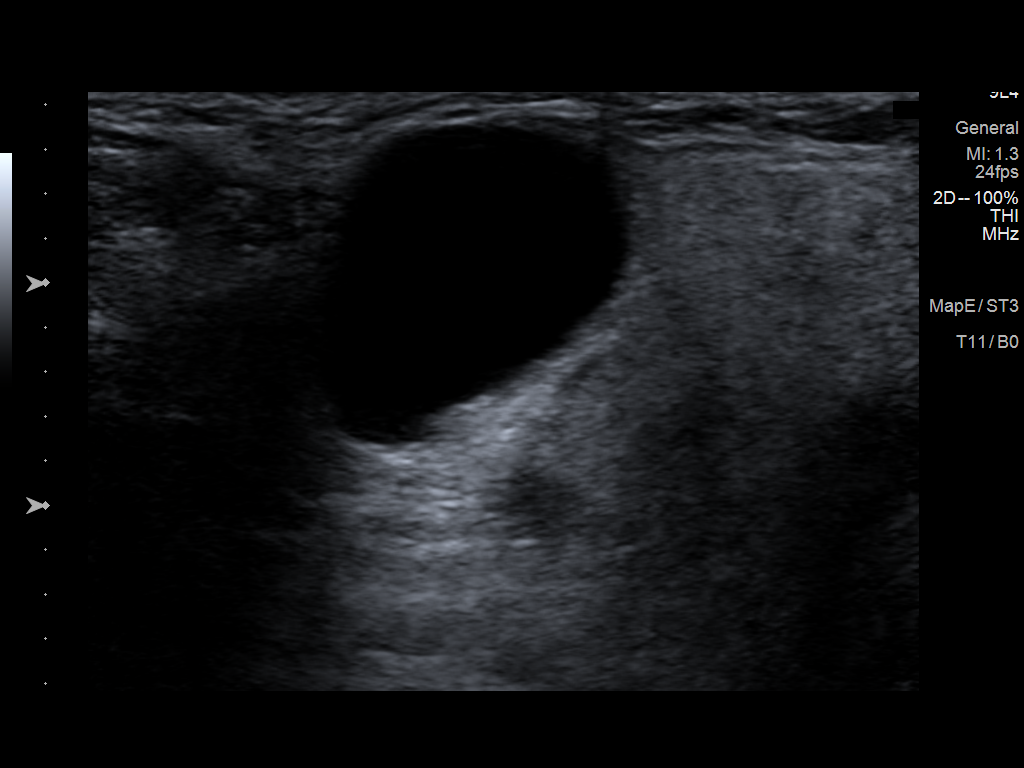
[im 2/4]
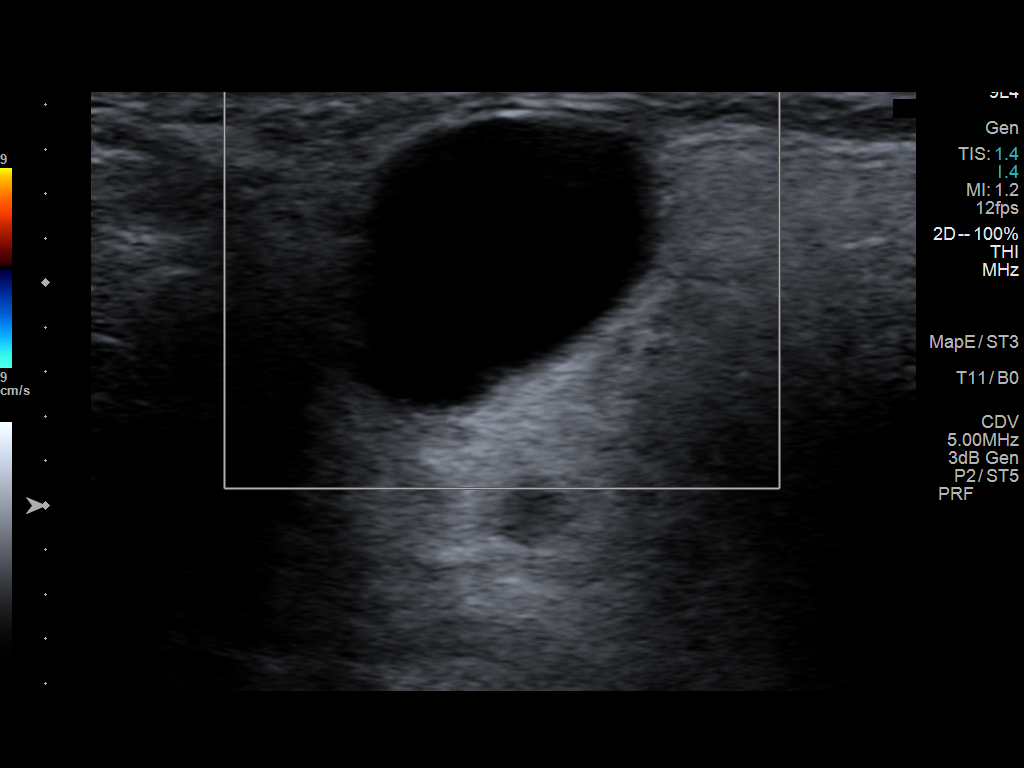
[im 3/4]
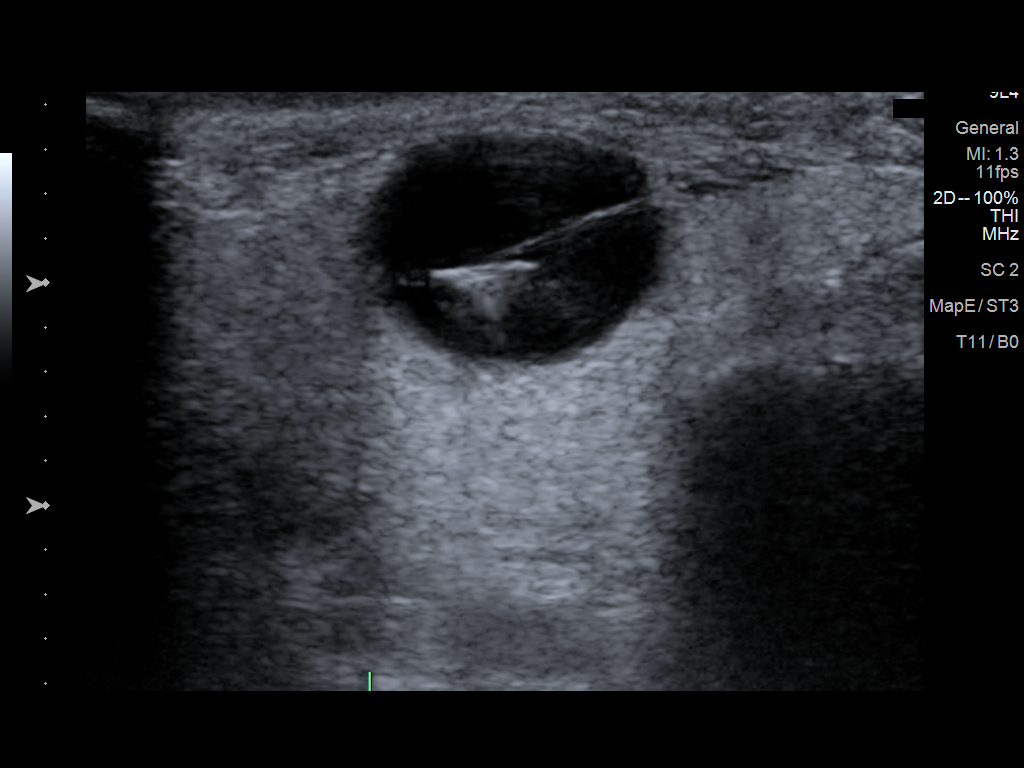
[im 4/4]
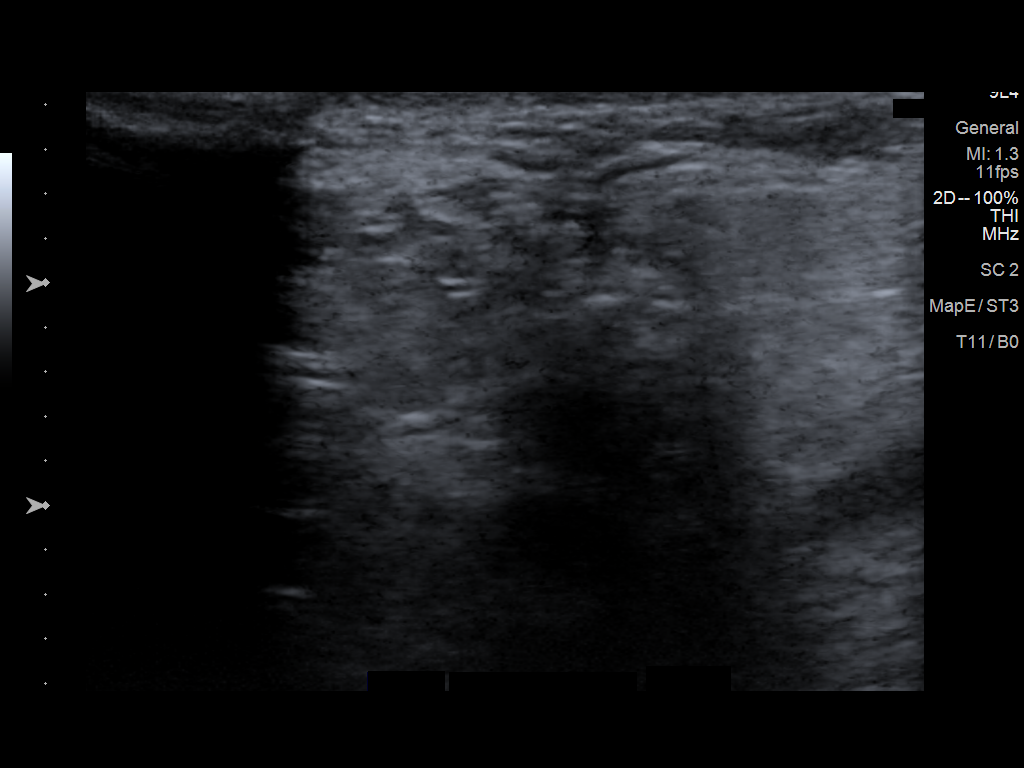

[4 of 4 positions shown; findings below may reference images not displayed]

EXAM:
ULTRASOUND GUIDED FINE NEEDLE ASPIRATION OF RIGHT PAROTID CYST

MEDICATIONS:
Moderate (conscious) sedation was employed during this procedure. A
total of Versed 1.0 mg and Fentanyl 50 mcg was administered
intravenously by radiology nursing.

Moderate Sedation Time: 10 minutes. The patient's level of
consciousness and vital signs were monitored continuously by
radiology nursing throughout the procedure under my direct
supervision.

PROCEDURE:
The procedure, risks, benefits, and alternatives were explained to
the patient. Questions regarding the procedure were encouraged and
answered. The patient understands and consents to the procedure. A
time out was performed prior to initiating the procedure.

Ultrasound was used to localize a right parotid lesion. The right
face was prepped with chlorhexidine in a sterile fashion, and a
sterile drape was applied covering the operative field. A sterile
gown and sterile gloves were used for the procedure. Local
anesthesia was provided with 1% Lidocaine.

An 18 gauge spinal needle was introduced into a right parotid lesion
and aspiration performed. Aspirated fluid sample was sent for
cytologic analysis. Additional ultrasound was performed after
aspiration.

COMPLICATIONS:
None.
FINDINGS: Ultrasound demonstrates a well-circumscribed ovoid cystic structure
in the inferior and superficial aspect of the right parotid gland
corresponding to the palpable abnormality and abnormality seen by
prior CT. Aspiration yielded 1.5 mL of clear fluid and resulted in
complete decompression of the cyst.
IMPRESSION: Ultrasound demonstrates a simple cyst of the right parotid gland
corresponding to the palpable abnormality and measuring
approximately 1.7 cm in greatest diameter. Aspiration of the cyst
under ultrasound guidance yielded 1.5 mL of clear fluid resulting in
complete decompression of the cyst. The fluid sample was sent for
cytologic analysis.

## 2023-07-29 ENCOUNTER — Ambulatory Visit: Payer: Medicare Other | Attending: Cardiology | Admitting: Cardiology

## 2023-07-29 ENCOUNTER — Other Ambulatory Visit: Payer: Self-pay

## 2023-07-29 ENCOUNTER — Encounter: Payer: Self-pay | Admitting: Cardiology

## 2023-07-29 VITALS — BP 136/82 | HR 64 | Ht 64.0 in | Wt 186.6 lb

## 2023-07-29 DIAGNOSIS — I4819 Other persistent atrial fibrillation: Secondary | ICD-10-CM

## 2023-07-29 DIAGNOSIS — I10 Essential (primary) hypertension: Secondary | ICD-10-CM | POA: Diagnosis not present

## 2023-07-29 DIAGNOSIS — E7800 Pure hypercholesterolemia, unspecified: Secondary | ICD-10-CM

## 2023-07-29 NOTE — Progress Notes (Signed)
Cardiology Office Note:    Date:  07/29/2023   ID:  Jeff Ryan, DOB October 31, 1945, MRN [id number]  PCP:  [name], DO   [facility] Providers Cardiologist:  Debbe Odea, MD     Referring MD: Verlee Monte, NP   Chief Complaint  Patient presents with   New Patient (Initial Visit)    Referred for cardiac evaluation of atrial fibrillation with previous work up with [facility] cardiology and would like to transfer care to [facility].    History of Present Illness:    Jeff Ryan is a 78 y.o. male with a hx of persistent atrial fibrillation, hypertension, hyperlipidemia presenting to establish care.  Previously seen by [facility] cardiology from a cardiac perspective.  Underwent preop eval prior to left nephrectomy due to an obstruction.  Noted to have atrial fibrillation during preop workup about 4 months ago.  He was started on Lopressor, Eliquis 5 mg twice daily for stroke prophylaxis.  He denies palpitations, chest pain, shortness of breath, dizziness, stroke.  He is very active, still mows his lawn with a push mower without any symptoms.  Tolerating Eliquis with no bleeding issues.  Outside echo obtained at [facility] cardiology 03/2023 showed normal systolic function EF 50 to 55%.  Past Medical History:  Diagnosis Date   Aortic atherosclerosis (HCC)    Atrial fibrillation with slow ventricular response (HCC) 03/26/2023   a.) noted on preop ECG 03/26/2023; b.) CHA2DS2-VASc: 4 (age x 2, HTN, vascular disease history); rate/rhythm maintained on oral metoprolol tartrate; chronically anticoagulated using standard dose apixaban (will start DOAC postop once cleared by surgery on 04/05/2023).   Atrophy of kidney    Dyslipidemia    Epigastric hernia    Essential hypertension    Insomnia    Nephrolithiasis    On apixaban therapy 04/01/2023   Pre-diabetes    Systolic murmur 04/01/2023   a.) grade II/VI; mitral area   Umbilical hernia     Past Surgical  History:  Procedure Laterality Date   CATARACT EXTRACTION, BILATERAL     left 10/26/2014 right 10/12/2014   EPIGASTRIC HERNIA REPAIR N/A 04/05/2023   Procedure: HERNIA REPAIR EPIGASTRIC ADULT;  Surgeon: Campbell Lerner, MD;  Location: [facility] ORS;  Service: General;  Laterality: N/A;   ROBOT ASSISTED LAPAROSCOPIC NEPHRECTOMY Left 04/05/2023   Procedure: XI ROBOTIC ASSISTED LAPAROSCOPIC NEPHRECTOMY;  Surgeon: [name], MD;  Location: [facility] ORS;  Service: Urology;  Laterality: Left;   TONSILLECTOMY AND ADENOIDECTOMY     US ECHOCARDIOGRAPHY     10/2014   VASECTOMY      Current Medications: Current Meds  Medication Sig   apixaban (ELIQUIS) 5 MG TABS tablet Take 5 mg by mouth 2 (two) times daily.   lisinopril (ZESTRIL) 40 MG tablet Take 1 tablet (40 mg total) by mouth daily.   metoprolol tartrate (LOPRESSOR) 25 MG tablet Take 1 tablet by mouth 2 (two) times daily.   simvastatin (ZOCOR) 40 MG tablet Take 1 tablet (40 mg total) by mouth daily with supper.   traZODone (DESYREL) 100 MG tablet Take 1 tablet (100 mg total) by mouth at bedtime.     Allergies:   Patient has no known allergies.   Social History   Socioeconomic History   Marital status: Married    Spouse name: Not on file   Number of children: Not on file   Years of education: Not on file   Highest education level: Associate degree: occupational, Scientist, product/process development, or vocational program  Occupational History  Occupation: Retired Production assistant, radio)    Comment: Company in [place], traveled around world, retired age 8  Tobacco Use   Smoking status: Former    Current packs/day: 0.00    Types: Cigarettes    Quit date: 01/11/2007    Years since quitting: 16.5   Smokeless tobacco: Former  Building services engineer status: Never Used  Substance and Sexual Activity   Alcohol use: Yes    Alcohol/week: 7.0 standard drinks of alcohol    Types: 7 Cans of beer per week   Drug use: No   Sexual activity: Not Currently  Other Topics Concern   Not  on file  Social History Narrative   Not on file   Social Drivers of Health   Financial Resource Strain: Low Risk  (04/30/2023)   Overall Financial Resource Strain (CARDIA)    Difficulty of Paying Living Expenses: Not hard at all  Food Insecurity: No Food Insecurity (04/30/2023)   Hunger Vital Sign    Worried About Running Out of Food in the Last Year: Never true    Ran Out of Food in the Last Year: Never true  Transportation Needs: No Transportation Needs (04/30/2023)   PRAPARE - Administrator, Civil Service (Medical): No    Lack of Transportation (Non-Medical): No  Physical Activity: Sufficiently Active (04/30/2023)   Exercise Vital Sign    Days of Exercise per Week: 6 days    Minutes of Exercise per Session: 30 min  Stress: No Stress Concern Present (04/30/2023)   [employer] of Occupational Health - Occupational Stress Questionnaire    Feeling of Stress : Not at all  Social Connections: Unknown (04/30/2023)   Social Connection and Isolation Panel [NHANES]    Frequency of Communication with Friends and Family: Twice a week    Frequency of Social Gatherings with Friends and Family: Once a week    Attends Religious Services: Patient declined    Database administrator or Organizations: No    Attends Engineer, structural: Not on file    Marital Status: Married     Family History: The patient's family history includes Heart attack in his brother; Heart disease in his brother. There is no history of Prostate cancer or Colon cancer.  ROS:   Please see the history of present illness.     All other systems reviewed and are negative.  EKGs/Labs/Other Studies Reviewed:    The following studies were reviewed today:  EKG Interpretation Date/Time:  Thursday July 29 2023 08:36:14 EST Ventricular Rate:  64 PR Interval:    QRS Duration:  74 QT Interval:  396 QTC Calculation: 408 R Axis:   -41  Text Interpretation: Atrial fibrillation Left axis  deviation Confirmed by Debbe Odea ([id number]) on 07/29/2023 8:45:46 AM    Recent Labs: 04/07/2023: BUN 16; Creatinine, Ser 1.20; Hemoglobin 11.5; Platelets 198; Potassium 4.3; Sodium 135  Recent Lipid Panel    Component Value Date/Time   CHOL 165 07/20/2022 0820   CHOL 144 04/15/2015 1000   TRIG 110 07/20/2022 0820   HDL 60 07/20/2022 0820   HDL 63 04/15/2015 1000   CHOLHDL 2.8 07/20/2022 0820   VLDL 10 02/17/2016 0924   LDLCALC 84 07/20/2022 0820     Risk Assessment/Calculations:             Physical Exam:    VS:  BP 136/82 (BP Location: Left Arm, Patient Position: Sitting, Cuff Size: Normal)   Pulse 64   Ht 5'  4" (1.626 m)   Wt 186 lb 9.6 oz (84.6 kg)   SpO2 98%   BMI 32.03 kg/m     Wt Readings from Last 3 Encounters:  07/29/23 186 lb 9.6 oz (84.6 kg)  05/05/23 178 lb (80.7 kg)  05/04/23 184 lb (83.5 kg)     GEN:  Well nourished, well developed in no acute distress HEENT: Normal NECK: No JVD; No carotid bruits CARDIAC: Irregular irregular, nontachycardic RESPIRATORY:  Clear to auscultation without rales, wheezing or rhonchi  ABDOMEN: Soft, non-tender, non-distended MUSCULOSKELETAL:  No edema; No deformity  SKIN: Warm and dry NEUROLOGIC:  Alert and oriented x 3 PSYCHIATRIC:  Normal affect   ASSESSMENT:    1. Persistent atrial fibrillation (HCC)   2. Primary hypertension   3. Pure hypercholesterolemia    PLAN:    In order of problems listed above:  Persistent atrial fibrillation, EF 50 to 55%.  Clinically asymptomatic.  Continue Lopressor 25 mg twice daily, Eliquis 5 mg twice daily.  Has been on anticoagulation for over 2 months uninterrupted.  Plan DC cardioversion.  Refer to A-fib clinic. Hypertension, blood pressure controlled.  Continue lisinopril 40 mg daily, Lopressor 25 mg twice daily. Hyperlipidemia, cholesterol controlled.  Continue simvastatin 40 mg daily.  Follow-up in 6-8 weeks after cardioversion.      Informed Consent   Shared  Decision Making/Informed Consent The risks (stroke, cardiac arrhythmias rarely resulting in the need for a temporary or permanent pacemaker, skin irritation or burns and complications associated with conscious sedation including aspiration, arrhythmia, respiratory failure and death), benefits (restoration of normal sinus rhythm) and alternatives of a direct current cardioversion were explained in detail to Mr. Brush and he agrees to proceed.         Medication Adjustments/Labs and Tests Ordered: Current medicines are reviewed at length with the patient today.  Concerns regarding medicines are outlined above.  Orders Placed This Encounter  Procedures   Basic Metabolic Panel (BMET)   CBC   Ambulatory referral to Cardiac Electrophysiology   EKG 12-Lead   No orders of the defined types were placed in this encounter.   Patient Instructions  Medication Instructions:   Your physician recommends that you continue on your current medications as directed. Please refer to the Current Medication list given to you today.  *If you need a refill on your cardiac medications before your next appointment, please call your pharmacy*   Lab Work:  Your physician recommends you have labs - BMP / CBC  If you have labs (blood work) drawn today and your tests are completely normal, you will receive your results only by: MyChart Message (if you have MyChart) OR A paper copy in the mail If you have any lab test that is abnormal or we need to change your treatment, we will call you to review the results.   Testing/Procedures:  You are scheduled for a Cardioversion on January 24th with Dr. Azucena Cecil.    Please arrive at the [facility]ascular Center Entrance of [facility], [address], [place] [address] at 6:30 am (This is 1 hour prior to your procedure time).  Proceed to the Check-In Desk directly inside the entrance.  Procedure Parking: Use the entrance off of the [address] side of the  hospital. Turn right upon entering and follow the driveway to parking that is directly in front of the Heart & Vascular Center. There is no valet parking available at this entrance, however there is an awning directly in front  of the Heart & Vascular Center for drop off/ pick up for patients  DIET: Nothing to eat or drink after midnight except a sip of water with medications (see medication instructions below)  Medication Instructions: Continue your anticoagulant: Eliquis If you miss a dose, please call us at [phone] You will need to continue your anticoagulant after your procedure until you are told by your provider that it is safe to stop.   Labs: TODAY  FYI: For your safety, and to allow [place] to monitor your vital signs accurately during the surgery/procedure we request that if you have artificial nails, gel coating, SNS etc. Please have those removed prior to your surgery/procedure. Not having the nail coverings /polish removed may result in cancellation or delay of your surgery/procedure.  You must have a responsible person to drive you home and stay in the waiting area during your procedure. Failure to do so could result in cancellation.  Bring your insurance cards.  If you have any questions after you get home, please call the office at [phone]  *Special Note: Every effort is made to have your procedure done on time. Occasionally there are emergencies that occur at the hospital that may cause delays. Please be patient if a delay does occur.         Follow-Up: At [facility], you and your health needs are our priority.  As part of our continuing mission to provide you with exceptional heart care, we have created designated Provider Care Teams.  These Care Teams include your primary Cardiologist (physician) and Advanced Practice Providers (APPs -  Physician Assistants and Nurse Practitioners) who all work together to provide you with the care you need, when you need  it.  We recommend signing up for the patient portal called "MyChart".  Sign up information is provided on this After Visit Summary.  MyChart is used to connect with patients for Virtual Visits (Telemedicine).  Patients are able to view lab/test results, encounter notes, upcoming appointments, etc.  Non-urgent messages can be sent to your provider as well.   To learn more about what you can do with MyChart, go to [email or web address].    Your next appointment:   6 week(s)  Provider:   You may see Debbe Odea, MD or one of the following Advanced Practice Providers on your designated Care Team:   Nicolasa Ducking, NP Eula Listen, PA-C Cadence Fransico Michael, PA-C Charlsie Quest, NP Carlos Levering, NP    Signed, Debbe Odea, MD  07/29/2023 9:47 AM    [facility]

## 2023-07-29 NOTE — Patient Instructions (Signed)
Medication Instructions:   Your physician recommends that you continue on your current medications as directed. Please refer to the Current Medication list given to you today.  *If you need a refill on your cardiac medications before your next appointment, please call your pharmacy*   Lab Work:  Your physician recommends you have labs - BMP / CBC  If you have labs (blood work) drawn today and your tests are completely normal, you will receive your results only by: MyChart Message (if you have MyChart) OR A paper copy in the mail If you have any lab test that is abnormal or we need to change your treatment, we will call you to review the results.   Testing/Procedures:  You are scheduled for a Cardioversion on January 24th with Dr. Azucena Cecil.    Please arrive at the Heart & Vascular Center Entrance of [facility], [address], [place] [address] at 6:30 am (This is 1 hour prior to your procedure time).  Proceed to the Check-In Desk directly inside the entrance.  Procedure Parking: Use the entrance off of the [address] side of the hospital. Turn right upon entering and follow the driveway to parking that is directly in front of the Heart & Vascular Center. There is no valet parking available at this entrance, however there is an awning directly in front of the Heart & Vascular Center for drop off/ pick up for patients  DIET: Nothing to eat or drink after midnight except a sip of water with medications (see medication instructions below)  Medication Instructions: Continue your anticoagulant: Eliquis If you miss a dose, please call us at [phone] You will need to continue your anticoagulant after your procedure until you are told by your provider that it is safe to stop.   Labs: TODAY  FYI: For your safety, and to allow [place] to monitor your vital signs accurately during the surgery/procedure we request that if you have artificial nails, gel coating, SNS etc. Please have those  removed prior to your surgery/procedure. Not having the nail coverings /polish removed may result in cancellation or delay of your surgery/procedure.  You must have a responsible person to drive you home and stay in the waiting area during your procedure. Failure to do so could result in cancellation.  Bring your insurance cards.  If you have any questions after you get home, please call the office at [phone]  *Special Note: Every effort is made to have your procedure done on time. Occasionally there are emergencies that occur at the hospital that may cause delays. Please be patient if a delay does occur.         Follow-Up: At [facility], you and your health needs are our priority.  As part of our continuing mission to provide you with exceptional heart care, we have created designated Provider Care Teams.  These Care Teams include your primary Cardiologist (physician) and Advanced Practice Providers (APPs -  Physician Assistants and Nurse Practitioners) who all work together to provide you with the care you need, when you need it.  We recommend signing up for the patient portal called "MyChart".  Sign up information is provided on this After Visit Summary.  MyChart is used to connect with patients for Virtual Visits (Telemedicine).  Patients are able to view lab/test results, encounter notes, upcoming appointments, etc.  Non-urgent messages can be sent to your provider as well.   To learn more about what you can do with MyChart, go to [email or web address].  Your next appointment:   6 week(s)  Provider:   You may see Debbe Odea, MD or one of the following Advanced Practice Providers on your designated Care Team:   Nicolasa Ducking, NP Eula Listen, PA-C Cadence Fransico Michael, PA-C Charlsie Quest, NP Carlos Levering, NP

## 2023-07-29 NOTE — H&P (View-Only) (Signed)
Cardiology Office Note:    Date:  07/29/2023   ID:  Jeff Ryan, DOB [birth year], MRN [id number]  PCP:  [name], DO    HeartCare Providers Cardiologist:  Debbe Odea, MD     Referring MD: Verlee Monte, NP   Chief Complaint  Patient presents with   New Patient (Initial Visit)    Referred for cardiac evaluation of atrial fibrillation with previous work up with [facility] cardiology and would like to transfer care to [facility].    History of Present Illness:    Jeff Ryan is a 78 y.o. male with a hx of persistent atrial fibrillation, hypertension, hyperlipidemia presenting to establish care.  Previously seen by [facility] cardiology from a cardiac perspective.  Underwent preop eval prior to left nephrectomy due to an obstruction.  Noted to have atrial fibrillation during preop workup about 4 months ago.  He was started on Lopressor, Eliquis 5 mg twice daily for stroke prophylaxis.  He denies palpitations, chest pain, shortness of breath, dizziness, stroke.  He is very active, still mows his lawn with a push mower without any symptoms.  Tolerating Eliquis with no bleeding issues.  Outside echo obtained at [facility] cardiology 03/2023 showed normal systolic function EF 50 to 55%.  Past Medical History:  Diagnosis Date   Aortic atherosclerosis (HCC)    Atrial fibrillation with slow ventricular response (HCC) 03/26/2023   a.) noted on preop ECG 03/26/2023; b.) CHA2DS2-VASc: 4 (age x 2, HTN, vascular disease history); rate/rhythm maintained on oral metoprolol tartrate; chronically anticoagulated using standard dose apixaban (will start DOAC postop once cleared by surgery on 04/05/2023).   Atrophy of kidney    Dyslipidemia    Epigastric hernia    Essential hypertension    Insomnia    Nephrolithiasis    On apixaban therapy 04/01/2023   Pre-diabetes    Systolic murmur 04/01/2023   a.) grade II/VI; mitral area   Umbilical hernia     Past Surgical  History:  Procedure Laterality Date   CATARACT EXTRACTION, BILATERAL     left 10/26/2014 right 10/12/2014   EPIGASTRIC HERNIA REPAIR N/A 04/05/2023   Procedure: HERNIA REPAIR EPIGASTRIC ADULT;  Surgeon: Campbell Lerner, MD;  Location: [facility] ORS;  Service: General;  Laterality: N/A;   ROBOT ASSISTED LAPAROSCOPIC NEPHRECTOMY Left 04/05/2023   Procedure: XI ROBOTIC ASSISTED LAPAROSCOPIC NEPHRECTOMY;  Surgeon: [name], MD;  Location: [facility] ORS;  Service: Urology;  Laterality: Left;   TONSILLECTOMY AND ADENOIDECTOMY     US ECHOCARDIOGRAPHY     10/2014   VASECTOMY      Current Medications: Current Meds  Medication Sig   apixaban (ELIQUIS) 5 MG TABS tablet Take 5 mg by mouth 2 (two) times daily.   lisinopril (ZESTRIL) 40 MG tablet Take 1 tablet (40 mg total) by mouth daily.   metoprolol tartrate (LOPRESSOR) 25 MG tablet Take 1 tablet by mouth 2 (two) times daily.   simvastatin (ZOCOR) 40 MG tablet Take 1 tablet (40 mg total) by mouth daily with supper.   traZODone (DESYREL) 100 MG tablet Take 1 tablet (100 mg total) by mouth at bedtime.     Allergies:   Patient has no known allergies.   Social History   Socioeconomic History   Marital status: Married    Spouse name: Not on file   Number of children: Not on file   Years of education: Not on file   Highest education level: Associate degree: occupational, Scientist, product/process development, or vocational program  Occupational History  Occupation: Retired Production assistant, radio)    Comment: Company in [place], traveled around world, retired age 26  Tobacco Use   Smoking status: Former    Current packs/day: 0.00    Types: Cigarettes    Quit date: 01/11/2007    Years since quitting: 16.5   Smokeless tobacco: Former  Building services engineer status: Never Used  Substance and Sexual Activity   Alcohol use: Yes    Alcohol/week: 7.0 standard drinks of alcohol    Types: 7 Cans of beer per week   Drug use: No   Sexual activity: Not Currently  Other Topics Concern   Not  on file  Social History Narrative   Not on file   Social Drivers of Health   Financial Resource Strain: Low Risk  (04/30/2023)   Overall Financial Resource Strain (CARDIA)    Difficulty of Paying Living Expenses: Not hard at all  Food Insecurity: No Food Insecurity (04/30/2023)   Hunger Vital Sign    Worried About Running Out of Food in the Last Year: Never true    Ran Out of Food in the Last Year: Never true  Transportation Needs: No Transportation Needs (04/30/2023)   PRAPARE - Administrator, Civil Service (Medical): No    Lack of Transportation (Non-Medical): No  Physical Activity: Sufficiently Active (04/30/2023)   Exercise Vital Sign    Days of Exercise per Week: 6 days    Minutes of Exercise per Session: 30 min  Stress: No Stress Concern Present (04/30/2023)   [employer] of Occupational Health - Occupational Stress Questionnaire    Feeling of Stress : Not at all  Social Connections: Unknown (04/30/2023)   Social Connection and Isolation Panel [NHANES]    Frequency of Communication with Friends and Family: Twice a week    Frequency of Social Gatherings with Friends and Family: Once a week    Attends Religious Services: Patient declined    Database administrator or Organizations: No    Attends Engineer, structural: Not on file    Marital Status: Married     Family History: The patient's family history includes Heart attack in his brother; Heart disease in his brother. There is no history of Prostate cancer or Colon cancer.  ROS:   Please see the history of present illness.     All other systems reviewed and are negative.  EKGs/Labs/Other Studies Reviewed:    The following studies were reviewed today:  EKG Interpretation Date/Time:  Thursday July 29 2023 08:36:14 EST Ventricular Rate:  64 PR Interval:    QRS Duration:  74 QT Interval:  396 QTC Calculation: 408 R Axis:   -41  Text Interpretation: Atrial fibrillation Left axis  deviation Confirmed by Debbe Odea ([id number]) on 07/29/2023 8:45:46 AM    Recent Labs: 04/07/2023: BUN 16; Creatinine, Ser 1.20; Hemoglobin 11.5; Platelets 198; Potassium 4.3; Sodium 135  Recent Lipid Panel    Component Value Date/Time   CHOL 165 07/20/2022 0820   CHOL 144 04/15/2015 1000   TRIG 110 07/20/2022 0820   HDL 60 07/20/2022 0820   HDL 63 04/15/2015 1000   CHOLHDL 2.8 07/20/2022 0820   VLDL 10 02/17/2016 0924   LDLCALC 84 07/20/2022 0820     Risk Assessment/Calculations:             Physical Exam:    VS:  BP 136/82 (BP Location: Left Arm, Patient Position: Sitting, Cuff Size: Normal)   Pulse 64   Ht 5'  4" (1.626 m)   Wt 186 lb 9.6 oz (84.6 kg)   SpO2 98%   BMI 32.03 kg/m     Wt Readings from Last 3 Encounters:  07/29/23 186 lb 9.6 oz (84.6 kg)  05/05/23 178 lb (80.7 kg)  05/04/23 184 lb (83.5 kg)     GEN:  Well nourished, well developed in no acute distress HEENT: Normal NECK: No JVD; No carotid bruits CARDIAC: Irregular irregular, nontachycardic RESPIRATORY:  Clear to auscultation without rales, wheezing or rhonchi  ABDOMEN: Soft, non-tender, non-distended MUSCULOSKELETAL:  No edema; No deformity  SKIN: Warm and dry NEUROLOGIC:  Alert and oriented x 3 PSYCHIATRIC:  Normal affect   ASSESSMENT:    1. Persistent atrial fibrillation (HCC)   2. Primary hypertension   3. Pure hypercholesterolemia    PLAN:    In order of problems listed above:  Persistent atrial fibrillation, EF 50 to 55%.  Clinically asymptomatic.  Continue Lopressor 25 mg twice daily, Eliquis 5 mg twice daily.  Has been on anticoagulation for over 2 months uninterrupted.  Plan DC cardioversion.  Refer to A-fib clinic. Hypertension, blood pressure controlled.  Continue lisinopril 40 mg daily, Lopressor 25 mg twice daily. Hyperlipidemia, cholesterol controlled.  Continue simvastatin 40 mg daily.  Follow-up in 6-8 weeks after cardioversion.      Informed Consent   Shared  Decision Making/Informed Consent The risks (stroke, cardiac arrhythmias rarely resulting in the need for a temporary or permanent pacemaker, skin irritation or burns and complications associated with conscious sedation including aspiration, arrhythmia, respiratory failure and death), benefits (restoration of normal sinus rhythm) and alternatives of a direct current cardioversion were explained in detail to Mr. Tabor and he agrees to proceed.         Medication Adjustments/Labs and Tests Ordered: Current medicines are reviewed at length with the patient today.  Concerns regarding medicines are outlined above.  Orders Placed This Encounter  Procedures   Basic Metabolic Panel (BMET)   CBC   Ambulatory referral to Cardiac Electrophysiology   EKG 12-Lead   No orders of the defined types were placed in this encounter.   Patient Instructions  Medication Instructions:   Your physician recommends that you continue on your current medications as directed. Please refer to the Current Medication list given to you today.  *If you need a refill on your cardiac medications before your next appointment, please call your pharmacy*   Lab Work:  Your physician recommends you have labs - BMP / CBC  If you have labs (blood work) drawn today and your tests are completely normal, you will receive your results only by: MyChart Message (if you have MyChart) OR A paper copy in the mail If you have any lab test that is abnormal or we need to change your treatment, we will call you to review the results.   Testing/Procedures:  You are scheduled for a Cardioversion on January 24th with Dr. Azucena Cecil.    Please arrive at the Heart & Vascular Center Entrance of [facility], [address], [place] [address] at 6:30 am (This is 1 hour prior to your procedure time).  Proceed to the Check-In Desk directly inside the entrance.  Procedure Parking: Use the entrance off of the [address] side of the  hospital. Turn right upon entering and follow the driveway to parking that is directly in front of the Heart & Vascular Center. There is no valet parking available at this entrance, however there is an awning directly in front  of the Heart & Vascular Center for drop off/ pick up for patients  DIET: Nothing to eat or drink after midnight except a sip of water with medications (see medication instructions below)  Medication Instructions: Continue your anticoagulant: Eliquis If you miss a dose, please call us at [phone] You will need to continue your anticoagulant after your procedure until you are told by your provider that it is safe to stop.   Labs: TODAY  FYI: For your safety, and to allow [place] to monitor your vital signs accurately during the surgery/procedure we request that if you have artificial nails, gel coating, SNS etc. Please have those removed prior to your surgery/procedure. Not having the nail coverings /polish removed may result in cancellation or delay of your surgery/procedure.  You must have a responsible person to drive you home and stay in the waiting area during your procedure. Failure to do so could result in cancellation.  Bring your insurance cards.  If you have any questions after you get home, please call the office at [phone]  *Special Note: Every effort is made to have your procedure done on time. Occasionally there are emergencies that occur at the hospital that may cause delays. Please be patient if a delay does occur.         Follow-Up: At [facility], you and your health needs are our priority.  As part of our continuing mission to provide you with exceptional heart care, we have created designated Provider Care Teams.  These Care Teams include your primary Cardiologist (physician) and Advanced Practice Providers (APPs -  Physician Assistants and Nurse Practitioners) who all work together to provide you with the care you need, when you need  it.  We recommend signing up for the patient portal called "MyChart".  Sign up information is provided on this After Visit Summary.  MyChart is used to connect with patients for Virtual Visits (Telemedicine).  Patients are able to view lab/test results, encounter notes, upcoming appointments, etc.  Non-urgent messages can be sent to your provider as well.   To learn more about what you can do with MyChart, go to [email or web address].    Your next appointment:   6 week(s)  Provider:   You may see Debbe Odea, MD or one of the following Advanced Practice Providers on your designated Care Team:   Nicolasa Ducking, NP Eula Listen, PA-C Cadence Fransico Michael, PA-C Charlsie Quest, NP Carlos Levering, NP    Signed, Debbe Odea, MD  07/29/2023 9:47 AM    [facility]

## 2023-07-30 LAB — BASIC METABOLIC PANEL
BUN/Creatinine Ratio: 16 (ref 10–24)
BUN: 24 mg/dL (ref 8–27)
CO2: 27 mmol/L (ref 20–29)
Calcium: 9.4 mg/dL (ref 8.6–10.2)
Chloride: 100 mmol/L (ref 96–106)
Creatinine, Ser: 1.49 mg/dL — ABNORMAL HIGH (ref 0.76–1.27)
Glucose: 96 mg/dL (ref 70–99)
Potassium: 4.8 mmol/L (ref 3.5–5.2)
Sodium: 139 mmol/L (ref 134–144)
eGFR: 48 mL/min/{1.73_m2} — ABNORMAL LOW (ref >59)

## 2023-07-30 LAB — CBC
Hematocrit: 47 % (ref 37.5–51.0)
Hemoglobin: 15.3 g/dL (ref 13.0–17.7)
MCH: 31.2 pg (ref 26.6–33.0)
MCHC: 32.6 g/dL (ref 31.5–35.7)
MCV: 96 fL (ref 79–97)
Platelets: 247 10*3/uL (ref 150–450)
RBC: 4.91 x10E6/uL (ref 4.14–5.80)
RDW: 12.5 % (ref 11.6–15.4)
WBC: 5.7 10*3/uL (ref 3.4–10.8)

## 2023-08-04 ENCOUNTER — Other Ambulatory Visit: Payer: Medicare Other

## 2023-08-04 DIAGNOSIS — Z0000 Encounter for general adult medical examination without abnormal findings: Secondary | ICD-10-CM

## 2023-08-04 DIAGNOSIS — Z125 Encounter for screening for malignant neoplasm of prostate: Secondary | ICD-10-CM

## 2023-08-04 DIAGNOSIS — N182 Chronic kidney disease, stage 2 (mild): Secondary | ICD-10-CM | POA: Diagnosis not present

## 2023-08-04 DIAGNOSIS — R739 Hyperglycemia, unspecified: Secondary | ICD-10-CM | POA: Diagnosis not present

## 2023-08-04 DIAGNOSIS — E782 Mixed hyperlipidemia: Secondary | ICD-10-CM

## 2023-08-04 DIAGNOSIS — R351 Nocturia: Secondary | ICD-10-CM

## 2023-08-04 DIAGNOSIS — I129 Hypertensive chronic kidney disease with stage 1 through stage 4 chronic kidney disease, or unspecified chronic kidney disease: Secondary | ICD-10-CM

## 2023-08-05 LAB — COMPLETE METABOLIC PANEL WITH GFR
AG Ratio: 1.5 (calc) (ref 1.0–2.5)
ALT: 13 U/L (ref 9–46)
AST: 14 U/L (ref 10–35)
Albumin: 4.5 g/dL (ref 3.6–5.1)
Alkaline phosphatase (APISO): 59 U/L (ref 35–144)
BUN/Creatinine Ratio: 18 (calc) (ref 6–22)
BUN: 25 mg/dL (ref 7–25)
CO2: 31 mmol/L (ref 20–32)
Calcium: 9.4 mg/dL (ref 8.6–10.3)
Chloride: 100 mmol/L (ref 98–110)
Creat: 1.4 mg/dL — ABNORMAL HIGH (ref 0.70–1.28)
Globulin: 3.1 g/dL (ref 1.9–3.7)
Glucose, Bld: 107 mg/dL — ABNORMAL HIGH (ref 65–99)
Potassium: 4.4 mmol/L (ref 3.5–5.3)
Sodium: 136 mmol/L (ref 135–146)
Total Bilirubin: 0.8 mg/dL (ref 0.2–1.2)
Total Protein: 7.6 g/dL (ref 6.1–8.1)
eGFR: 52 mL/min/{1.73_m2} — ABNORMAL LOW (ref >=60)

## 2023-08-05 LAB — LIPID PANEL
Cholesterol: 162 mg/dL (ref <200)
HDL: 61 mg/dL (ref >=40)
LDL Cholesterol (Calc): 86 mg/dL
Non-HDL Cholesterol (Calc): 101 mg/dL (ref <130)
Total CHOL/HDL Ratio: 2.7 (calc) (ref <5.0)
Triglycerides: 67 mg/dL (ref <150)

## 2023-08-05 LAB — CBC WITH DIFFERENTIAL/PLATELET
Absolute Lymphocytes: 2184 {cells}/uL (ref 850–3900)
Absolute Monocytes: 520 {cells}/uL (ref 200–950)
Basophils Absolute: 52 {cells}/uL (ref 0–200)
Basophils Relative: 0.8 %
Eosinophils Absolute: 78 {cells}/uL (ref 15–500)
Eosinophils Relative: 1.2 %
HCT: 47.9 % (ref 38.5–50.0)
Hemoglobin: 15.5 g/dL (ref 13.2–17.1)
MCH: 30.9 pg (ref 27.0–33.0)
MCHC: 32.4 g/dL (ref 32.0–36.0)
MCV: 95.4 fL (ref 80.0–100.0)
MPV: 11.2 fL (ref 7.5–12.5)
Monocytes Relative: 8 %
Neutro Abs: 3666 {cells}/uL (ref 1500–7800)
Neutrophils Relative %: 56.4 %
Platelets: 247 10*3/uL (ref 140–400)
RBC: 5.02 10*6/uL (ref 4.20–5.80)
RDW: 12.2 % (ref 11.0–15.0)
Total Lymphocyte: 33.6 %
WBC: 6.5 10*3/uL (ref 3.8–10.8)

## 2023-08-05 LAB — PSA: PSA: 0.25 ng/mL (ref <=4.00)

## 2023-08-05 LAB — HEMOGLOBIN A1C
Hgb A1c MFr Bld: 5.9 %{Hb} — ABNORMAL HIGH (ref <5.7)
Mean Plasma Glucose: 123 mg/dL
eAG (mmol/L): 6.8 mmol/L

## 2023-08-05 LAB — TSH: TSH: 1.75 m[IU]/L (ref 0.40–4.50)

## 2023-08-06 ENCOUNTER — Encounter: Payer: Self-pay | Admitting: Cardiology

## 2023-08-06 ENCOUNTER — Encounter
Admission: RE | Disposition: A | Discharge status: Home / Self Care | Payer: Self-pay | Source: Home / Self Care | Attending: Cardiology

## 2023-08-06 ENCOUNTER — Ambulatory Visit: Payer: Medicare Other | Admitting: Anesthesiology

## 2023-08-06 ENCOUNTER — Ambulatory Visit
Admission: RE | Admit: 2023-08-06 | Discharge: 2023-08-06 | Disposition: A | Discharge status: Home / Self Care | Payer: Medicare Other | Attending: Cardiology | Admitting: Cardiology

## 2023-08-06 DIAGNOSIS — I119 Hypertensive heart disease without heart failure: Secondary | ICD-10-CM | POA: Diagnosis not present

## 2023-08-06 DIAGNOSIS — Z7901 Long term (current) use of anticoagulants: Secondary | ICD-10-CM | POA: Diagnosis not present

## 2023-08-06 DIAGNOSIS — I700 Atherosclerosis of aorta: Secondary | ICD-10-CM | POA: Insufficient documentation

## 2023-08-06 DIAGNOSIS — G4700 Insomnia, unspecified: Secondary | ICD-10-CM | POA: Diagnosis not present

## 2023-08-06 DIAGNOSIS — I4819 Other persistent atrial fibrillation: Secondary | ICD-10-CM | POA: Diagnosis not present

## 2023-08-06 DIAGNOSIS — N1831 Chronic kidney disease, stage 3a: Secondary | ICD-10-CM | POA: Diagnosis not present

## 2023-08-06 DIAGNOSIS — I4891 Unspecified atrial fibrillation: Secondary | ICD-10-CM | POA: Diagnosis not present

## 2023-08-06 DIAGNOSIS — Z87891 Personal history of nicotine dependence: Secondary | ICD-10-CM | POA: Diagnosis not present

## 2023-08-06 DIAGNOSIS — I129 Hypertensive chronic kidney disease with stage 1 through stage 4 chronic kidney disease, or unspecified chronic kidney disease: Secondary | ICD-10-CM | POA: Diagnosis not present

## 2023-08-06 HISTORY — PX: CARDIOVERSION: SHX1299

## 2023-08-06 SURGERY — CARDIOVERSION
Anesthesia: General

## 2023-08-06 MED ORDER — SODIUM CHLORIDE 0.9% FLUSH: 3.0000 mL | Freq: Two times a day (BID) | INTRAVENOUS | Status: DC

## 2023-08-06 MED ORDER — PROPOFOL 10 MG/ML IV BOLUS
INTRAVENOUS | Status: DC | PRN
  Administered 2023-08-06: 40 mg via INTRAVENOUS

## 2023-08-06 MED ORDER — SODIUM CHLORIDE 0.9% FLUSH: 3.0000 mL | INTRAVENOUS | Status: DC | PRN

## 2023-08-06 NOTE — Anesthesia Preprocedure Evaluation (Addendum)
Anesthesia Evaluation  Patient identified by MRN, date of birth, ID band Patient awake    Reviewed: Allergy & Precautions, H&P , NPO status , Patient's Chart, lab work & pertinent test results  Airway Mallampati: II  TM Distance: >3 FB Neck ROM: full    Dental no notable dental hx. (+) Upper Dentures, Partial Upper   Pulmonary neg pulmonary ROS, former smoker   Pulmonary exam normal breath sounds clear to auscultation       Cardiovascular Exercise Tolerance: Good hypertension, Pt. on medications negative cardio ROS Normal cardiovascular exam+ dysrhythmias Atrial Fibrillation + Valvular Problems/Murmurs  Rhythm:Irregular Rate:Abnormal  ECHO 9/24: INTERPRETATION  NORMAL LEFT VENTRICULAR SYSTOLIC FUNCTION  NORMAL RIGHT VENTRICULAR SYSTOLIC FUNCTION  MILD VALVULAR REGURGITATION (See above)  NO VALVULAR STENOSIS  MILD mitral/tricuspid regurgitation  LVEF 50-55%     Neuro/Psych negative neurological ROS  negative psych ROS   GI/Hepatic negative GI ROS, Neg liver ROS,,,  Endo/Other  negative endocrine ROS    Renal/GU Renal InsufficiencyRenal diseasenegative Renal ROS  negative genitourinary   Musculoskeletal   Abdominal  (+) + obese  Peds  Hematology negative hematology ROS (+)   Anesthesia Other Findings Past Medical History: No date: Aortic atherosclerosis (HCC) 03/26/2023: Atrial fibrillation with slow ventricular response (HCC)     Comment:  a.) noted on preop ECG 03/26/2023; b.) CHA2DS2-VASc: 4               (age x 2, HTN, vascular disease history); rate/rhythm               maintained on oral metoprolol tartrate; chronically               anticoagulated using standard dose apixaban (will start               DOAC postop once cleared by surgery on 04/05/2023). No date: Atrophy of kidney No date: Dyslipidemia No date: Epigastric hernia No date: Essential hypertension No date: Insomnia No date:  Nephrolithiasis 04/01/2023: On apixaban therapy No date: Pre-diabetes 04/01/2023: Systolic murmur     Comment:  a.) grade II/VI; mitral area No date: Umbilical hernia  Past Surgical History: No date: CATARACT EXTRACTION, BILATERAL     Comment:  left 10/26/2014 right 10/12/2014 04/05/2023: EPIGASTRIC HERNIA REPAIR; N/A     Comment:  Procedure: HERNIA REPAIR EPIGASTRIC ADULT;  Surgeon:               Campbell Lerner, MD;  Location: [facility] ORS;  Service:               General;  Laterality: N/A; 04/05/2023: ROBOT ASSISTED LAPAROSCOPIC NEPHRECTOMY; Left     Comment:  Procedure: XI ROBOTIC ASSISTED LAPAROSCOPIC NEPHRECTOMY;              Surgeon: [name], MD;  Location: [facility] ORS;                Service: Urology;  Laterality: Left; No date: TONSILLECTOMY AND ADENOIDECTOMY No date: US ECHOCARDIOGRAPHY     Comment:  10/2014 No date: VASECTOMY     Reproductive/Obstetrics negative OB ROS                             Anesthesia Physical Anesthesia Plan  ASA: 3  Anesthesia Plan: General   Post-op Pain Management:    Induction:   PONV Risk Score and Plan: Propofol infusion and TIVA  Airway Management Planned: Natural Airway and Nasal Cannula  Additional Equipment:  Intra-op Plan:   Post-operative Plan:   Informed Consent: I have reviewed the patients History and Physical, chart, labs and discussed the procedure including the risks, benefits and alternatives for the proposed anesthesia with the patient or authorized representative who has indicated his/her understanding and acceptance.     Dental Advisory Given  Plan Discussed with: CRNA and Surgeon  Anesthesia Plan Comments:         Anesthesia Quick Evaluation

## 2023-08-06 NOTE — Anesthesia Postprocedure Evaluation (Signed)
Anesthesia Post Note  Patient: Jeff Ryan  Procedure(s) Performed: CARDIOVERSION  Patient location during evaluation: PACU Anesthesia Type: General Level of consciousness: awake and awake and alert Pain management: satisfactory to patient Vital Signs Assessment: post-procedure vital signs reviewed and stable Respiratory status: spontaneous breathing Cardiovascular status: blood pressure returned to baseline Anesthetic complications: no   No notable events documented.   Last Vitals:  Vitals:   08/06/23 0732 08/06/23 0735  BP: 133/81 (!) 164/91  Pulse:    Resp:    Temp:    SpO2: 97%     Last Pain:  Vitals:   08/06/23 0703  TempSrc: Oral  PainSc: 0-No pain                 VAN STAVEREN,Adaya Garmany

## 2023-08-06 NOTE — Procedures (Signed)
Cardioversion procedure note For atrial fibrillation.  Procedure Details:  Consent: Risks of procedure as well as the alternatives and risks of each were explained to the (patient/caregiver).  Consent for procedure obtained.  Time Out: Verified patient identification, verified procedure, site/side was marked, verified correct patient position, special equipment/implants available, medications/allergies/relevent history reviewed, required imaging and test results available.  Performed  Patient placed on cardiac monitor, pulse oximetry, supplemental oxygen as necessary.   Sedation given: propofol IV per anesthesia team. Pacer pads placed anterior and posterior chest.   Cardioverted 1 time(s).   Cardioverted at  200J. Synchronized biphasic Converted to NSR/SB heart rate 52  Evaluation: Findings: Post procedure EKG shows: NSR/SB Complications: None Patient did tolerate procedure well.  Time Spent Directly with the Patient:  25 minutes   Debbe Odea, M.D.

## 2023-08-06 NOTE — Transfer of Care (Signed)
Immediate Anesthesia Transfer of Care Note  Patient: Jeff Ryan  Procedure(s) Performed: CARDIOVERSION  Patient Location: PACU  Anesthesia Type:General  Level of Consciousness: sedated  Airway & Oxygen Therapy: Patient Spontanous Breathing and Patient connected to nasal cannula oxygen  Post-op Assessment: Report given to RN and Post -op Vital signs reviewed and stable  Post vital signs: Reviewed and stable  Last Vitals:  Vitals Value Taken Time  BP 164/91 08/06/23 0735  Temp    Pulse    Resp    SpO2 97 % 08/06/23 0732    Last Pain:  Vitals:   08/06/23 0703  TempSrc: Oral  PainSc: 0-No pain         Complications: No notable events documented.

## 2023-08-06 NOTE — Interval H&P Note (Signed)
History and Physical Interval Note:  08/06/2023 7:41 AM  Jeff Ryan  has presented today for surgery, with the diagnosis of persistent Afib.  The various methods of treatment have been discussed with the patient and family. After consideration of risks, benefits and other options for treatment, the patient has consented to  Procedure(s): CARDIOVERSION (N/A) as a surgical intervention.  The patient's history has been reviewed, patient examined, no change in status, stable for surgery.  I have reviewed the patient's chart and labs.  Questions were answered to the patient's satisfaction.     [name]

## 2023-08-07 ENCOUNTER — Encounter: Payer: Self-pay | Admitting: Cardiology

## 2023-08-12 ENCOUNTER — Ambulatory Visit: Payer: Medicare Other | Admitting: Family Medicine

## 2023-08-12 ENCOUNTER — Encounter: Payer: Self-pay | Admitting: Family Medicine

## 2023-08-12 VITALS — BP 124/82 | HR 59 | Ht 71.0 in | Wt 188.0 lb

## 2023-08-12 DIAGNOSIS — F5101 Primary insomnia: Secondary | ICD-10-CM

## 2023-08-12 DIAGNOSIS — I129 Hypertensive chronic kidney disease with stage 1 through stage 4 chronic kidney disease, or unspecified chronic kidney disease: Secondary | ICD-10-CM | POA: Diagnosis not present

## 2023-08-12 DIAGNOSIS — Z0000 Encounter for general adult medical examination without abnormal findings: Secondary | ICD-10-CM | POA: Diagnosis not present

## 2023-08-12 DIAGNOSIS — E7849 Other hyperlipidemia: Secondary | ICD-10-CM

## 2023-08-12 DIAGNOSIS — I4819 Other persistent atrial fibrillation: Secondary | ICD-10-CM

## 2023-08-12 DIAGNOSIS — N183 Chronic kidney disease, stage 3 unspecified: Secondary | ICD-10-CM

## 2023-08-12 DIAGNOSIS — E782 Mixed hyperlipidemia: Secondary | ICD-10-CM

## 2023-08-12 DIAGNOSIS — I10 Essential (primary) hypertension: Secondary | ICD-10-CM | POA: Diagnosis not present

## 2023-08-12 DIAGNOSIS — N182 Chronic kidney disease, stage 2 (mild): Secondary | ICD-10-CM | POA: Diagnosis not present

## 2023-08-12 MED ORDER — SIMVASTATIN 40 MG PO TABS: 40.0000 mg | ORAL_TABLET | Freq: Every day | ORAL | 3 refills | Status: DC

## 2023-08-12 MED ORDER — TRAZODONE HCL 100 MG PO TABS: 100.0000 mg | ORAL_TABLET | Freq: Every day | ORAL | 3 refills | Status: DC

## 2023-08-12 MED ORDER — LISINOPRIL 40 MG PO TABS: 40.0000 mg | ORAL_TABLET | Freq: Every day | ORAL | 3 refills | Status: DC

## 2023-08-12 NOTE — Progress Notes (Signed)
Subjective:    Patient ID: Jeff Ryan, male    DOB: 08/19/1945, 78 y.o.   MRN: [id number]  Zae Kirtz is a 78 y.o. male presenting on 08/12/2023 for Pre-Diabetes and Annual Exam   HPI  Discussed the use of AI scribe software for clinical note transcription with the patient, who gave verbal consent to proceed.  History of Present Illness   The patient is a 78 year old male who presents for an annual physical exam.  Atrial Fibrillation - resolved, Sinus Rhythm S/p Cardioversion 08/06/23  He recently underwent a cardioversion last Friday and feels better since the procedure. He describes the process as quick and effective, with a sensation similar to a sunburn for the first two days post-procedure. He plans to follow up with his cardiologist in February or March.     s/p Left Nephrectomy, Kidney due to chronic Hydronephrosis with history of kidney stones Followed by [facility] Dr [name] and [facility] Dr Suezanne Jacquet Last lab Creatinine 1.4, shown improvement from 1.49 Anticipated improve kidney function over time  CHRONIC HYPERTENSION / CKD-II Monitoring Home BP controlled. Current Meds - Lisinopril 40mg , Metoprolol 25mg  BID - OFF HCTZ Reports good compliance, took meds today. Tolerating well, w/o complaints.   Elevated A1c Overweight BMI >26 A1c results down to 5.9, prior 6.2 to 6.3 Improved lifestyle   Hyperlipidemia Controlled LDL 80s on Simvastatin 40mg   Epigastric Hernia S/p repair epigastric hernia     Insomnia Improved control on Trazodone 100mg  daily   Former smoker Quit 2008     Health Maintenance:   Flu Shot updated, COVID Booster   PSA 0.25  negative, 2025. Prior 0.40   Cologuard 04/2020 negative. Due for repeat order. He declines to have it ordered today, he will proceed w/ Cologuard repeat, but since out of town soon, he will notify us later when ready for ordering  Prevnar-20 vaccine updated, 06/23/21     08/12/2023    9:44 AM  05/04/2023    9:39 AM 11/02/2022    8:30 AM  Depression screen PHQ 2/9  Decreased Interest 0 0 0  Down, Depressed, Hopeless 0 0 0  PHQ - 2 Score 0 0 0  Altered sleeping  0 0  Tired, decreased energy  0 0  Change in appetite  0 0  Feeling bad or failure about yourself   0 0  Trouble concentrating  0 0  Moving slowly or fidgety/restless  0 0  Suicidal thoughts  0 0  PHQ-9 Score  0 0       08/12/2023    9:44 AM 05/04/2023    9:39 AM 11/02/2022    8:37 AM 07/27/2022    8:41 AM  GAD 7 : Generalized Anxiety Score  Nervous, Anxious, on Edge 0 0 0 0  Control/stop worrying 0 0 0 0  Worry too much - different things 0 0 0 0  Trouble relaxing 0 0 0 0  Restless 0 0 0 0  Easily annoyed or irritable 0 0 0 0  Afraid - awful might happen 0 0 0 0  Total GAD 7 Score 0 0 0 0  Anxiety Difficulty Not difficult at all   Not difficult at all     Past Medical History:  Diagnosis Date   Aortic atherosclerosis (HCC)    Atrial fibrillation with slow ventricular response (HCC) 03/26/2023   a.) noted on preop ECG 03/26/2023; b.) CHA2DS2-VASc: 4 (age x 2, HTN, vascular disease history); rate/rhythm maintained on oral metoprolol  tartrate; chronically anticoagulated using standard dose apixaban (will start DOAC postop once cleared by surgery on 04/05/2023).   Atrophy of kidney    Dyslipidemia    Epigastric hernia    Essential hypertension    Insomnia    Nephrolithiasis    On apixaban therapy 04/01/2023   Pre-diabetes    Systolic murmur 04/01/2023   a.) grade II/VI; mitral area   Umbilical hernia    Past Surgical History:  Procedure Laterality Date   CARDIOVERSION N/A 08/06/2023   Procedure: CARDIOVERSION;  Surgeon: Debbe Odea, MD;  Location: [facility] OR[facility];  Service: Cardiovascular;  Laterality: N/A;   CATARACT EXTRACTION, BILATERAL     left 10/26/2014 right 10/12/2014   EPIGASTRIC HERNIA REPAIR N/A 04/05/2023   Procedure: HERNIA REPAIR EPIGASTRIC ADULT;  Surgeon: Campbell Lerner, MD;   Location: [facility] ORS;  Service: General;  Laterality: N/A;   ROBOT ASSISTED LAPAROSCOPIC NEPHRECTOMY Left 04/05/2023   Procedure: XI ROBOTIC ASSISTED LAPAROSCOPIC NEPHRECTOMY;  Surgeon: [name], MD;  Location: [facility] ORS;  Service: Urology;  Laterality: Left;   TONSILLECTOMY AND ADENOIDECTOMY     US ECHOCARDIOGRAPHY     10/2014   VASECTOMY     Social History   Socioeconomic History   Marital status: Married    Spouse name: Not on file   Number of children: Not on file   Years of education: Not on file   Highest education level: Associate degree: occupational, Scientist, product/process development, or vocational program  Occupational History   Occupation: Retired Production assistant, radio)    Comment: Company in [place], traveled around world, retired age 39  Tobacco Use   Smoking status: Former    Current packs/day: 0.00    Types: Cigarettes    Quit date: 01/11/2007    Years since quitting: 16.5   Smokeless tobacco: Never  Vaping Use   Vaping status: Never Used  Substance and Sexual Activity   Alcohol use: Yes    Alcohol/week: 7.0 standard drinks of alcohol    Types: 7 Cans of beer per week   Drug use: No   Sexual activity: Not Currently  Other Topics Concern   Not on file  Social History Narrative   Not on file   Social Drivers of Health   Financial Resource Strain: Low Risk  (04/30/2023)   Overall Financial Resource Strain (CARDIA)    Difficulty of Paying Living Expenses: Not hard at all  Food Insecurity: No Food Insecurity (04/30/2023)   Hunger Vital Sign    Worried About Running Out of Food in the Last Year: Never true    Ran Out of Food in the Last Year: Never true  Transportation Needs: No Transportation Needs (04/30/2023)   PRAPARE - Administrator, Civil Service (Medical): No    Lack of Transportation (Non-Medical): No  Physical Activity: Sufficiently Active (04/30/2023)   Exercise Vital Sign    Days of Exercise per Week: 6 days    Minutes of Exercise per Session: 30 min  Stress:  No Stress Concern Present (04/30/2023)   [employer] of Occupational Health - Occupational Stress Questionnaire    Feeling of Stress : Not at all  Social Connections: Unknown (04/30/2023)   Social Connection and Isolation Panel [NHANES]    Frequency of Communication with Friends and Family: Twice a week    Frequency of Social Gatherings with Friends and Family: Once a week    Attends Religious Services: Patient declined    Database administrator or Organizations: No    Attends [employer]  or Organization Meetings: Not on file    Marital Status: Married  Catering manager Violence: Not on file   Family History  Problem Relation Age of Onset   Heart disease Brother    Heart attack Brother        x 3   Prostate cancer Neg Hx    Colon cancer Neg Hx    Current Outpatient Medications on File Prior to Visit  Medication Sig   apixaban (ELIQUIS) 5 MG TABS tablet Take 5 mg by mouth 2 (two) times daily.   capsaicin (ZOSTRIX) 0.025 % cream Apply 1 Application topically 2 (two) times daily as needed (arthritis pain.).   metoprolol tartrate (LOPRESSOR) 25 MG tablet Take 25 mg by mouth 2 (two) times daily.   No current facility-administered medications on file prior to visit.    Review of Systems Per HPI unless specifically indicated above     Objective:    BP 124/82   Pulse (!) 59   Ht 5\' 11"  (1.803 m)   Wt 188 lb (85.3 kg)   SpO2 98%   BMI 26.22 kg/m   Wt Readings from Last 3 Encounters:  08/12/23 188 lb (85.3 kg)  08/06/23 187 lb (84.8 kg)  07/29/23 186 lb 9.6 oz (84.6 kg)    Physical Exam Vitals and nursing note reviewed.  Constitutional:      General: He is not in acute distress.    Appearance: Normal appearance. He is well-developed. He is not diaphoretic.     Comments: Well-appearing, comfortable, cooperative  HENT:     Head: Normocephalic and atraumatic.  Eyes:     General:        Right eye: No discharge.        Left eye: No discharge.     Conjunctiva/sclera:  Conjunctivae normal.  Cardiovascular:     Rate and Rhythm: Normal rate and regular rhythm.     Pulses: Normal pulses.     Heart sounds: No murmur heard. Pulmonary:     Effort: Pulmonary effort is normal.  Skin:    General: Skin is warm and dry.     Findings: No erythema or rash.  Neurological:     Mental Status: He is alert and oriented to person, place, and time.  Psychiatric:        Mood and Affect: Mood normal.        Behavior: Behavior normal.        Thought Content: Thought content normal.     Comments: Well groomed, good eye contact, normal speech and thoughts     Results for orders placed or performed in visit on 08/04/23  TSH   Collection Time: 08/04/23  8:36 AM  Result Value Ref Range   TSH 1.75 0.40 - 4.50 mIU/L  PSA   Collection Time: 08/04/23  8:36 AM  Result Value Ref Range   PSA 0.25 < OR = 4.00 ng/mL  CBC with Differential/Platelet   Collection Time: 08/04/23  8:36 AM  Result Value Ref Range   WBC 6.5 3.8 - 10.8 Thousand/uL   RBC 5.02 4.20 - 5.80 Million/uL   Hemoglobin 15.5 13.2 - 17.1 g/dL   HCT 04.5 40.9 - 81.1 %   MCV 95.4 80.0 - 100.0 fL   MCH 30.9 27.0 - 33.0 pg   MCHC 32.4 32.0 - 36.0 g/dL   RDW 91.4 78.2 - 95.6 %   Platelets 247 140 - 400 Thousand/uL   MPV 11.2 7.5 - 12.5 fL   Neutro Abs  3,666 1,500 - 7,800 cells/uL   Absolute Lymphocytes 2,184 850 - 3,900 cells/uL   Absolute Monocytes 520 200 - 950 cells/uL   Eosinophils Absolute 78 15 - 500 cells/uL   Basophils Absolute 52 0 - 200 cells/uL   Neutrophils Relative % 56.4 %   Total Lymphocyte 33.6 %   Monocytes Relative 8.0 %   Eosinophils Relative 1.2 %   Basophils Relative 0.8 %  COMPLETE METABOLIC PANEL WITH GFR   Collection Time: 08/04/23  8:36 AM  Result Value Ref Range   Glucose, Bld 107 (H) 65 - 99 mg/dL   BUN 25 7 - 25 mg/dL   Creat 8.46 (H) 9.62 - 1.28 mg/dL   eGFR 52 (L) > OR = 60 mL/min/1.36m2   BUN/Creatinine Ratio 18 6 - 22 (calc)   Sodium 136 135 - 146 mmol/L   Potassium  4.4 3.5 - 5.3 mmol/L   Chloride 100 98 - 110 mmol/L   CO2 31 20 - 32 mmol/L   Calcium 9.4 8.6 - 10.3 mg/dL   Total Protein 7.6 6.1 - 8.1 g/dL   Albumin 4.5 3.6 - 5.1 g/dL   Globulin 3.1 1.9 - 3.7 g/dL (calc)   AG Ratio 1.5 1.0 - 2.5 (calc)   Total Bilirubin 0.8 0.2 - 1.2 mg/dL   Alkaline phosphatase (APISO) 59 35 - 144 U/L   AST 14 10 - 35 U/L   ALT 13 9 - 46 U/L  Lipid panel   Collection Time: 08/04/23  8:36 AM  Result Value Ref Range   Cholesterol 162 <200 mg/dL   HDL 61 > OR = 40 mg/dL   Triglycerides 67 <952 mg/dL   LDL Cholesterol (Calc) 86 mg/dL (calc)   Total CHOL/HDL Ratio 2.7 <5.0 (calc)   Non-HDL Cholesterol (Calc) 101 <130 mg/dL (calc)  Hemoglobin W4X   Collection Time: 08/04/23  8:36 AM  Result Value Ref Range   Hgb A1c MFr Bld 5.9 (H) <5.7 % of total Hgb   Mean Plasma Glucose 123 mg/dL   eAG (mmol/L) 6.8 mmol/L      Assessment & Plan:   Problem List Items Addressed This Visit     Benign hypertension with CKD (chronic kidney disease) stage III (HCC)   Relevant Medications   lisinopril (ZESTRIL) 40 MG tablet   simvastatin (ZOCOR) 40 MG tablet   Hyperlipidemia   Relevant Medications   lisinopril (ZESTRIL) 40 MG tablet   simvastatin (ZOCOR) 40 MG tablet   Persistent atrial fibrillation (HCC)   Relevant Medications   lisinopril (ZESTRIL) 40 MG tablet   simvastatin (ZOCOR) 40 MG tablet   Primary insomnia   Relevant Medications   traZODone (DESYREL) 100 MG tablet   Other Visit Diagnoses       Annual physical exam    -  Primary     Essential hypertension       Relevant Medications   lisinopril (ZESTRIL) 40 MG tablet   simvastatin (ZOCOR) 40 MG tablet        Updated Health Maintenance information Reviewed recent lab results with patient Encouraged improvement to lifestyle with diet and exercise Goal of weight loss  Atrial Fibrillation - resolved, now in Sinus Rhythm Recent cardioversion with reported resolution of AFib Follow-up appointment  scheduled with cardiologist in one month. -Continue current management and follow-up with cardiologist.  Prediabetes Improvement in HbA1c from 6.2 to 5.9, indicating good glucose control. -Continue current lifestyle modifications.  Hyperlipidemia Stable LDL levels in the 80s on Simvastatin 40mg  daily. -Continue Simvastatin  40mg  daily.  Insomnia Reports improved sleep with Trazodone. -Continue Trazodone 100mg  regularly for sleep  General Health Maintenance -All vaccinations up to date (flu, pneumonia, COVID boosters). -Declined Tetanus and Shingles vaccines. -Colon cancer screening due (last Cologuard in October 2021). Patient will contact office when ready to order. He will be out of town for while now so prefers to avoid order.        No orders of the defined types were placed in this encounter.   Meds ordered this encounter  Medications   lisinopril (ZESTRIL) 40 MG tablet    Sig: Take 1 tablet (40 mg total) by mouth daily.    Dispense:  90 tablet    Refill:  3    Requesting 1 year supply   simvastatin (ZOCOR) 40 MG tablet    Sig: Take 1 tablet (40 mg total) by mouth daily with supper.    Dispense:  90 tablet    Refill:  3    Request 1 year supply   traZODone (DESYREL) 100 MG tablet    Sig: Take 1 tablet (100 mg total) by mouth at bedtime.    Dispense:  90 tablet    Refill:  3    Request 1 year supply     Follow up plan: Return in about 6 months (around 02/09/2024) for 6 month PreDM A1c.  Saralyn Pilar, DO [facility] 08/12/2023, 10:00 AM

## 2023-08-12 NOTE — Patient Instructions (Addendum)
Thank you for coming to the office today.  When ready, please contact us back and I can order your Cologuard.  ---------------  Keep up the great work  Kidney function shows improved  I am hopeful the cardioversion continues to do well. Follow-up with Cardiology  Keep on current medications. Refills sent for 1 year.  Recent Labs    10/26/22 0805 08/04/23 0836  HGBA1C 6.2* 5.9*    Please schedule a Follow-up Appointment to: Return in about 6 months (around 02/09/2024) for 6 month PreDM A1c.  If you have any other questions or concerns, please feel free to call the office or send a message through MyChart. You may also schedule an earlier appointment if necessary.  Additionally, you may be receiving a survey about your experience at our office within a few days to 1 week by e-mail or mail. We value your feedback.  Saralyn Pilar, DO [facility], [place]

## 2023-08-30 DIAGNOSIS — D37030 Neoplasm of uncertain behavior of the parotid salivary glands: Secondary | ICD-10-CM | POA: Diagnosis not present

## 2023-09-09 ENCOUNTER — Telehealth: Payer: Self-pay | Admitting: Cardiology

## 2023-09-09 ENCOUNTER — Ambulatory Visit: Payer: Medicare Other | Attending: Cardiology | Admitting: Cardiology

## 2023-09-09 VITALS — BP 138/82 | HR 59 | Ht 71.0 in | Wt 194.0 lb

## 2023-09-09 DIAGNOSIS — I4819 Other persistent atrial fibrillation: Secondary | ICD-10-CM | POA: Diagnosis not present

## 2023-09-09 DIAGNOSIS — I10 Essential (primary) hypertension: Secondary | ICD-10-CM | POA: Diagnosis not present

## 2023-09-09 DIAGNOSIS — E7800 Pure hypercholesterolemia, unspecified: Secondary | ICD-10-CM | POA: Diagnosis not present

## 2023-09-09 NOTE — Telephone Encounter (Signed)
 Patient forgot to schedule fu appt LVM to scheduled 43month , please schedule

## 2023-09-09 NOTE — Progress Notes (Signed)
 Cardiology Office Note:    Date:  09/09/2023   ID:  Jeff Ryan, DOB 07-Mar-1946, MRN [id number]  PCP:  Jeff Cords, DO   [facility] Providers Cardiologist:  Jeff Odea, MD     Referring MD: Jeff Ryan *   No chief complaint on file.   History of Present Illness:    Jeff Ryan is a 78 y.o. male with a hx of persistent atrial fibrillation, hypertension, hyperlipidemia who presents for follow-up.    Last seen for persistent atrial fibrillation, underwent DC cardioversion 08/06/2023 successfully.  Denies palpitations, dizziness, presyncope or syncope.  Compliant with Eliquis and Lopressor as prescribed.  Denies any bleeding issues.  Recently referred to EP, appointment scheduled in about 2 weeks.  Prior notes/testing Outside echo obtained at [facility] cardiology 03/2023 showed normal systolic function EF 50 to 55%.  Past Medical History:  Diagnosis Date   Aortic atherosclerosis (HCC)    Atrial fibrillation with slow ventricular response (HCC) 03/26/2023   a.) noted on preop ECG 03/26/2023; b.) CHA2DS2-VASc: 4 (age x 2, HTN, vascular disease history); rate/rhythm maintained on oral metoprolol tartrate; chronically anticoagulated using standard dose apixaban (will start DOAC postop once cleared by surgery on 04/05/2023).   Atrophy of kidney    Dyslipidemia    Epigastric hernia    Essential hypertension    Insomnia    Nephrolithiasis    On apixaban therapy 04/01/2023   Pre-diabetes    Systolic murmur 04/01/2023   a.) grade II/VI; mitral area   Umbilical hernia     Past Surgical History:  Procedure Laterality Date   CARDIOVERSION N/A 08/06/2023   Procedure: CARDIOVERSION;  Surgeon: Jeff Odea, MD;  Location: [facility] ORS;  Service: Cardiovascular;  Laterality: N/A;   CATARACT EXTRACTION, BILATERAL     left 10/26/2014 right 10/12/2014   EPIGASTRIC HERNIA REPAIR N/A 04/05/2023   Procedure: HERNIA REPAIR EPIGASTRIC ADULT;  Surgeon:  Jeff Lerner, MD;  Location: [facility] ORS;  Service: General;  Laterality: N/A;   ROBOT ASSISTED LAPAROSCOPIC NEPHRECTOMY Left 04/05/2023   Procedure: XI ROBOTIC ASSISTED LAPAROSCOPIC NEPHRECTOMY;  Surgeon: Jeff Scotland, MD;  Location: [facility] ORS;  Service: Urology;  Laterality: Left;   TONSILLECTOMY AND ADENOIDECTOMY     US ECHOCARDIOGRAPHY     10/2014   VASECTOMY      Current Medications: Current Meds  Medication Sig   apixaban (ELIQUIS) 5 MG TABS tablet Take 5 mg by mouth 2 (two) times daily.   capsaicin (ZOSTRIX) 0.025 % cream Apply 1 Application topically 2 (two) times daily as needed (arthritis pain.).   lisinopril (ZESTRIL) 40 MG tablet Take 1 tablet (40 mg total) by mouth daily.   metoprolol tartrate (LOPRESSOR) 25 MG tablet Take 25 mg by mouth 2 (two) times daily.   simvastatin (ZOCOR) 40 MG tablet Take 1 tablet (40 mg total) by mouth daily with supper.   traZODone (DESYREL) 100 MG tablet Take 1 tablet (100 mg total) by mouth at bedtime.     Allergies:   Patient has no known allergies.   Social History   Socioeconomic History   Marital status: Married    Spouse name: Not on file   Number of children: Not on file   Years of education: Not on file   Highest education level: Associate degree: occupational, Scientist, product/process development, or vocational program  Occupational History   Occupation: Retired Production assistant, radio)    Comment: Company in [place], traveled around world, retired age 60  Tobacco Use   Smoking status: Former  Current packs/day: 0.00    Types: Cigarettes    Quit date: 01/11/2007    Years since quitting: 16.6   Smokeless tobacco: Never  Vaping Use   Vaping status: Never Used  Substance and Sexual Activity   Alcohol use: Yes    Alcohol/week: 7.0 standard drinks of alcohol    Types: 7 Cans of beer per week   Drug use: No   Sexual activity: Not Currently  Other Topics Concern   Not on file  Social History Narrative   Not on file   Social Drivers of Health   Financial  Resource Strain: Low Risk  (04/30/2023)   Overall Financial Resource Strain (CARDIA)    Difficulty of Paying Living Expenses: Not hard at all  Food Insecurity: No Food Insecurity (04/30/2023)   Hunger Vital Sign    Worried About Running Out of Food in the Last Year: Never true    Ran Out of Food in the Last Year: Never true  Transportation Needs: No Transportation Needs (04/30/2023)   PRAPARE - Administrator, Civil Service (Medical): No    Lack of Transportation (Non-Medical): No  Physical Activity: Sufficiently Active (04/30/2023)   Exercise Vital Sign    Days of Exercise per Week: 6 days    Minutes of Exercise per Session: 30 min  Stress: No Stress Concern Present (04/30/2023)   [employer] of Occupational Health - Occupational Stress Questionnaire    Feeling of Stress : Not at all  Social Connections: Unknown (04/30/2023)   Social Connection and Isolation Panel [NHANES]    Frequency of Communication with Friends and Family: Twice a week    Frequency of Social Gatherings with Friends and Family: Once a week    Attends Religious Services: Patient declined    Database administrator or Organizations: No    Attends Engineer, structural: Not on file    Marital Status: Married     Family History: The patient's family history includes Heart attack in his brother; Heart disease in his brother. There is no history of Prostate cancer or Colon cancer.  ROS:   Please see the history of present illness.     All other systems reviewed and are negative.  EKGs/Labs/Other Studies Reviewed:    The following studies were reviewed today:  EKG Interpretation Date/Time:  Thursday September 09 2023 10:43:14 EST Ventricular Rate:  59 PR Interval:    QRS Duration:  76 QT Interval:  406 QTC Calculation: 401 R Axis:   -38  Text Interpretation: Atrial flutter with slow ventricular response Left axis deviation Inferior infarct , age undetermined Confirmed by  Jeff Ryan ([id number]) on 09/09/2023 11:00:31 AM    Recent Labs: 08/04/2023: ALT 13; BUN 25; Creat 1.40; Hemoglobin 15.5; Platelets 247; Potassium 4.4; Sodium 136; TSH 1.75  Recent Lipid Panel    Component Value Date/Time   CHOL 162 08/04/2023 0836   CHOL 144 04/15/2015 1000   TRIG 67 08/04/2023 0836   HDL 61 08/04/2023 0836   HDL 63 04/15/2015 1000   CHOLHDL 2.7 08/04/2023 0836   VLDL 10 02/17/2016 0924   LDLCALC 86 08/04/2023 0836     Risk Assessment/Calculations:             Physical Exam:    VS:  BP 138/82 (BP Location: Left Arm, Patient Position: Sitting, Cuff Size: Normal)   Pulse (!) 59   Ht 5\' 11"  (1.803 m)   Wt 194 lb (88 kg)   SpO2 99%  BMI 27.06 kg/m     Wt Readings from Last 3 Encounters:  09/09/23 194 lb (88 kg)  08/12/23 188 lb (85.3 kg)  08/06/23 187 lb (84.8 kg)     GEN:  Well nourished, well developed in no acute distress HEENT: Normal NECK: No JVD; No carotid bruits CARDIAC: Irregular irregular, nontachycardic RESPIRATORY:  Clear to auscultation without rales, wheezing or rhonchi  ABDOMEN: Soft, non-tender, non-distended MUSCULOSKELETAL:  No edema; No deformity  SKIN: Warm and dry NEUROLOGIC:  Alert and oriented x 3 PSYCHIATRIC:  Normal affect   ASSESSMENT:    1. Persistent atrial fibrillation (HCC)   2. Primary hypertension   3. Pure hypercholesterolemia    PLAN:    In order of problems listed above:  Persistent atrial fibrillation, s/p failed DC cardioversion 08/06/2023. EF 50 to 55%.  Clinically asymptomatic.  continue Lopressor 25 mg twice daily, Eliquis 5 mg twice daily.  Has appointment with EP next month.  Will defer starting amiodarone and repeat cardioversion to EP/A-fib clinic. Hypertension, blood pressure controlled.  Continue lisinopril 40 mg daily, Lopressor 25 mg twice daily. Hyperlipidemia, cholesterol controlled.  Has some muscle aches.  Has been on simvastatin over 20 years.  Advised to hold simvastatin for 2 weeks to  see if any improvement in joint pain.  Restart statin if symptoms stay the same.  Follow-up in 3 months.     Medication Adjustments/Labs and Tests Ordered: Current medicines are reviewed at length with the patient today.  Concerns regarding medicines are outlined above.  Orders Placed This Encounter  Procedures   EKG 12-Lead   No orders of the defined types were placed in this encounter.   Patient Instructions  Medication Instructions:  - Your physician recommends that you continue on your current medications as directed. Please refer to the Current Medication list given to you today.  *If you need a refill on your cardiac medications before your next appointment, please call your pharmacy*   Lab Work: - none ordered  If you have labs (blood work) drawn today and your tests are completely normal, you will receive your results only by: MyChart Message (if you have MyChart) OR A paper copy in the mail If you have any lab test that is abnormal or we need to change your treatment, we will call you to review the results.   Testing/Procedures: - none ordered   Follow-Up: At [facility], you and your health needs are our priority.  As part of our continuing mission to provide you with exceptional heart care, we have created designated Provider Care Teams.  These Care Teams include your primary Cardiologist (physician) and Advanced Practice Providers (APPs -  Physician Assistants and Nurse Practitioners) who all work together to provide you with the care you need, when you need it.  We recommend signing up for the patient portal called "MyChart".  Sign up information is provided on this After Visit Summary.  MyChart is used to connect with patients for Virtual Visits (Telemedicine).  Patients are able to view lab/test results, encounter notes, upcoming appointments, etc.  Non-urgent messages can be sent to your provider as well.   To learn more about what you can do with  MyChart, go to [email or web address].    Your next appointment:   3 month(s)  Provider:   You may see Jeff Odea, MD or one of the following Advanced Practice Providers on your designated Care Team:   Nicolasa Ducking, NP Eula Listen, PA-C Cadence Fransico Michael, PA-C [place],  NP Carlos Levering, NP    Other Instructions N/a       Signed, Jeff Odea, MD  09/09/2023 12:50 PM    [facility]

## 2023-09-09 NOTE — Patient Instructions (Signed)
 Medication Instructions:  - Your physician recommends that you continue on your current medications as directed. Please refer to the Current Medication list given to you today.  *If you need a refill on your cardiac medications before your next appointment, please call your pharmacy*   Lab Work: - none ordered  If you have labs (blood work) drawn today and your tests are completely normal, you will receive your results only by: MyChart Message (if you have MyChart) OR A paper copy in the mail If you have any lab test that is abnormal or we need to change your treatment, we will call you to review the results.   Testing/Procedures: - none ordered   Follow-Up: At [facility], you and your health needs are our priority.  As part of our continuing mission to provide you with exceptional heart care, we have created designated Provider Care Teams.  These Care Teams include your primary Cardiologist (physician) and Advanced Practice Providers (APPs -  Physician Assistants and Nurse Practitioners) who all work together to provide you with the care you need, when you need it.  We recommend signing up for the patient portal called "MyChart".  Sign up information is provided on this After Visit Summary.  MyChart is used to connect with patients for Virtual Visits (Telemedicine).  Patients are able to view lab/test results, encounter notes, upcoming appointments, etc.  Non-urgent messages can be sent to your provider as well.   To learn more about what you can do with MyChart, go to [email or web address].    Your next appointment:   3 month(s)  Provider:   You may see Debbe Odea, MD or one of the following Advanced Practice Providers on your designated Care Team:   Nicolasa Ducking, NP Eula Listen, PA-C Cadence Fransico Michael, PA-C Charlsie Quest, NP Carlos Levering, NP    Other Instructions N/a

## 2023-09-21 NOTE — Progress Notes (Unsigned)
  Electrophysiology Office Note:    Date:  09/22/2023   ID:  Jeff Ryan, DOB 09-23-1945, MRN [id number]  [facility] Cardiologist:  Debbe Odea, MD  [facility] Electrophysiologist:  Lanier Prude, MD   Referring MD: Debbe Odea, MD   Chief Complaint: Persistent atrial fibrillation  History of Present Illness:    Jeff Ryan is a 78 year old man who I am seeing today for an evaluation of persistent atrial fibrillation at the request of Dr. Azucena Cecil.  The patient has a history of atrial fibrillation, hypertension, hyperlipidemia.  The patient was seen by Dr. Azucena Cecil on September 09, 2023.  He had a cardioversion on August 06, 2023.  He takes Eliquis for stroke prophylaxis.  He is doing well. He cannot detect his AF. He is very active, uses push mower, works in yard. No SOB or fatigue or lightheadedness. Tolerating eliquis withotu bleeding issues. Here with wife.     Their past medical, social and family history was reviewed.   ROS:   Please see the history of present illness.    All other systems reviewed and are negative.  EKGs/Labs/Other Studies Reviewed:    The following studies were reviewed today:  September 09, 2023 EKG shows atrial fibrillation with controlled ventricular rate  July 29, 2023 EKG shows atrial fibrillation with controlled ventricular rate  March 26, 2023 EKG shows atrial fibrillation with controlled ventricular rate  July 20, 2007 EKG shows sinus rhythm  April 01, 2023 echo Gavin Potters) EF 50 to 55% No significant valvular abnormalities       Physical Exam:    VS:  BP 122/66   Ht 5\' 11"  (1.803 m)   Wt 190 lb (86.2 kg)   SpO2 98%   BMI 26.50 kg/m     Wt Readings from Last 3 Encounters:  09/22/23 190 lb (86.2 kg)  09/09/23 194 lb (88 kg)  08/12/23 188 lb (85.3 kg)     GEN: no distress CARD: Irregularly irregular, No MRG RESP: No IWOB. CTAB.        ASSESSMENT AND PLAN:    1.  Persistent atrial fibrillation (HCC)   2. Primary hypertension     #Persistent atrial fibrillation Unclear chronicity but at least since September 2024.  Asymptomatic.  Normal EF. Continue Eliquis for stroke prophylaxis Continue metoprolol tartrate 25 mg by mouth twice daily He will continue Eliquis for stroke prophylaxis.  I discussed treatment options for his AF today. Given asymptomatic nature of his AF and normal EF, he has elected to proceed with rate control alone which I think is reasonable.  #Hypertension At goal today.  Recommend checking blood pressures 1-2 times per week at home and recording the values.  Recommend bringing these recordings to the primary care physician.   Plan for as needed EP follow-up moving forward.   Signed, Rossie Muskrat. Lalla Brothers, MD, [facility], [facility] 09/22/2023 10:30 AM    Electrophysiology  [facility]

## 2023-09-22 ENCOUNTER — Encounter: Payer: Self-pay | Admitting: Cardiology

## 2023-09-22 ENCOUNTER — Ambulatory Visit: Payer: Medicare Other | Attending: Cardiology | Admitting: Cardiology

## 2023-09-22 VITALS — BP 122/66 | Ht 71.0 in | Wt 190.0 lb

## 2023-09-22 DIAGNOSIS — I10 Essential (primary) hypertension: Secondary | ICD-10-CM | POA: Diagnosis not present

## 2023-09-22 DIAGNOSIS — I4819 Other persistent atrial fibrillation: Secondary | ICD-10-CM

## 2023-09-22 NOTE — Patient Instructions (Signed)
 Medication Instructions:  Your physician recommends that you continue on your current medications as directed. Please refer to the Current Medication list given to you today.  *If you need a refill on your cardiac medications before your next appointment, please call your pharmacy*  Follow-Up: At [facility], you and your health needs are our priority.  As part of our continuing mission to provide you with exceptional heart care, we have created designated Provider Care Teams.  These Care Teams include your primary Cardiologist (physician) and Advanced Practice Providers (APPs -  Physician Assistants and Nurse Practitioners) who all work together to provide you with the care you need, when you need it.  Your next appointment:   As needed with Dr. Lalla Brothers

## 2023-10-11 ENCOUNTER — Telehealth: Payer: Self-pay

## 2023-10-11 DIAGNOSIS — Z1211 Encounter for screening for malignant neoplasm of colon: Secondary | ICD-10-CM

## 2023-10-11 NOTE — Telephone Encounter (Signed)
 Copied from CRM [id number]. Topic: Clinical - Request for Lab/Test Order >> Oct 11, 2023  2:59 PM Franchot Heidelberg wrote: Reason for CRM: Pt called reporting that he is ready for his next cologuard kit. Pt says his PCP told him to call the office once he was ready.

## 2023-10-11 NOTE — Addendum Note (Signed)
 Addended by: [name] on: 10/11/2023 04:27 PM   Modules accepted: Orders

## 2023-10-11 NOTE — Telephone Encounter (Signed)
 Ordered Cologuard  Saralyn Pilar, DO [facility] 10/11/2023, 4:27 PM

## 2023-10-18 DIAGNOSIS — Z1211 Encounter for screening for malignant neoplasm of colon: Secondary | ICD-10-CM | POA: Diagnosis not present

## 2023-10-22 LAB — COLOGUARD: COLOGUARD: POSITIVE — AB

## 2023-10-25 ENCOUNTER — Encounter: Payer: Self-pay | Admitting: Family Medicine

## 2023-10-25 DIAGNOSIS — Z1211 Encounter for screening for malignant neoplasm of colon: Secondary | ICD-10-CM

## 2023-10-25 DIAGNOSIS — R195 Other fecal abnormalities: Secondary | ICD-10-CM

## 2023-11-09 DIAGNOSIS — R195 Other fecal abnormalities: Secondary | ICD-10-CM | POA: Diagnosis not present

## 2023-11-09 DIAGNOSIS — Z800 Family history of malignant neoplasm of digestive organs: Secondary | ICD-10-CM | POA: Diagnosis not present

## 2023-11-09 DIAGNOSIS — Z7901 Long term (current) use of anticoagulants: Secondary | ICD-10-CM | POA: Diagnosis not present

## 2023-11-16 ENCOUNTER — Telehealth: Payer: Self-pay

## 2023-11-16 NOTE — Telephone Encounter (Signed)
 Patient with diagnosis of atrial fibrillation on Eliquis for anticoagulation.    Procedure:   Colonoscopy    Date of Surgery:  Clearance 11/22/23      CHA2DS2-VASc Score = 3   This indicates a 3.2% annual risk of stroke. The patient's score is based upon: CHF History: 0 HTN History: 1 Diabetes History: 0 Stroke History: 0 Vascular Disease History: 0 Age Score: 2 Gender Score: 0    CrCl 54 Platelet count 247  Patient has not had an Afib/aflutter ablation within the last 3 months or DCCV within the last 30 days  (DCCV 07/2023)  Per office protocol, patient can hold Eliquis for 3 days prior to procedure.   Patient will not need bridging with Lovenox (enoxaparin) around procedure.  **This guidance is not considered finalized until pre-operative APP has relayed final recommendations.**

## 2023-11-16 NOTE — Telephone Encounter (Signed)
   Pre-operative Risk Assessment    Patient Name: Jeff Ryan  DOB: 19-Sep-1945 MRN: [id number]   Date of last office visit: 09/22/23 Date of next office visit: 12/07/23   Request for Surgical Clearance    Procedure:   Colonoscopy   Date of Surgery:  Clearance 11/22/23                                 Surgeon:  Dr. Shane Darling  Surgeon's Group or Practice Name:  [facility]  Phone number:  [phone] Fax number:  [phone]   Type of Clearance Requested:   - Medical  - Pharmacy:  Hold Apixaban (Eliquis)     Type of Anesthesia:  Not Indicated   Additional requests/questions:  "Anticoagulant needs to be discontinued preoperatively for the indicated times accordance with ASGE guidelines" Eliquis, CrCI (mL/min) >60  2 days  Eliquis, CrCI (mL/min) 30-59  3 days  Eliquis, CrCI (mL/min) 15-29  4 days   Signed, Zulema Hitchcock   11/16/2023, 11:20 AM

## 2023-11-16 NOTE — Telephone Encounter (Signed)
   Primary Cardiologist: Constancia Delton, MD  Chart reviewed as part of pre-operative protocol coverage. Given past medical history and time since last visit, based on ACC/AHA guidelines, Jeff Ryan would be at acceptable risk for the planned procedure without further cardiovascular testing.   Patient was advised that if he develops new symptoms prior to surgery to contact our office to arrange a follow-up appointment.  He verbalized understanding.  Per office protocol, patient can hold Eliquis for 3 days prior to procedure.   Patient will not need bridging with Lovenox (enoxaparin) around procedure.  I will route this recommendation to the requesting party via Epic fax function and remove from pre-op pool.  Please call with questions.  Gerldine Koch, NP-C  11/16/2023, 3:25 PM [address], [address] [place], [place] [address] Office [phone] Fax [phone]

## 2023-11-17 NOTE — Telephone Encounter (Signed)
 Caller (Melissa) wants a call back regarding status of patient's clearance.

## 2023-11-17 NOTE — Telephone Encounter (Signed)
 I called GI office back to give update the we cleared the pt yesterday and notes were faxed to [phone].   I will be happy to re-fax the clearance notes to Dr. Emerick Hanlon.

## 2023-11-22 ENCOUNTER — Ambulatory Visit: Admitting: Anesthesiology

## 2023-11-22 ENCOUNTER — Ambulatory Visit
Admission: RE | Admit: 2023-11-22 | Discharge: 2023-11-22 | Disposition: A | Discharge status: Home / Self Care | Attending: Gastroenterology | Admitting: Gastroenterology

## 2023-11-22 ENCOUNTER — Other Ambulatory Visit: Payer: Self-pay

## 2023-11-22 ENCOUNTER — Encounter
Admission: RE | Disposition: A | Discharge status: Home / Self Care | Payer: Self-pay | Source: Home / Self Care | Attending: Gastroenterology

## 2023-11-22 ENCOUNTER — Encounter: Payer: Self-pay | Admitting: Gastroenterology

## 2023-11-22 DIAGNOSIS — Z800 Family history of malignant neoplasm of digestive organs: Secondary | ICD-10-CM | POA: Insufficient documentation

## 2023-11-22 DIAGNOSIS — Z905 Acquired absence of kidney: Secondary | ICD-10-CM | POA: Insufficient documentation

## 2023-11-22 DIAGNOSIS — Z7901 Long term (current) use of anticoagulants: Secondary | ICD-10-CM | POA: Insufficient documentation

## 2023-11-22 DIAGNOSIS — R195 Other fecal abnormalities: Secondary | ICD-10-CM | POA: Diagnosis not present

## 2023-11-22 DIAGNOSIS — K6289 Other specified diseases of anus and rectum: Secondary | ICD-10-CM | POA: Diagnosis not present

## 2023-11-22 DIAGNOSIS — I10 Essential (primary) hypertension: Secondary | ICD-10-CM | POA: Insufficient documentation

## 2023-11-22 DIAGNOSIS — Z1211 Encounter for screening for malignant neoplasm of colon: Secondary | ICD-10-CM | POA: Diagnosis not present

## 2023-11-22 DIAGNOSIS — K5730 Diverticulosis of large intestine without perforation or abscess without bleeding: Secondary | ICD-10-CM | POA: Diagnosis not present

## 2023-11-22 DIAGNOSIS — K640 First degree hemorrhoids: Secondary | ICD-10-CM | POA: Insufficient documentation

## 2023-11-22 DIAGNOSIS — K635 Polyp of colon: Secondary | ICD-10-CM | POA: Diagnosis not present

## 2023-11-22 DIAGNOSIS — K621 Rectal polyp: Secondary | ICD-10-CM | POA: Diagnosis not present

## 2023-11-22 DIAGNOSIS — D124 Benign neoplasm of descending colon: Secondary | ICD-10-CM | POA: Diagnosis not present

## 2023-11-22 DIAGNOSIS — K649 Unspecified hemorrhoids: Secondary | ICD-10-CM | POA: Diagnosis not present

## 2023-11-22 SURGERY — COLONOSCOPY
Anesthesia: General

## 2023-11-22 MED ORDER — PROPOFOL 1000 MG/100ML IV EMUL
INTRAVENOUS | Status: AC
  Filled 2023-11-22: qty 100

## 2023-11-22 MED ORDER — PROPOFOL 500 MG/50ML IV EMUL
INTRAVENOUS | Status: DC | PRN
  Administered 2023-11-22: 50 ug/kg/min via INTRAVENOUS

## 2023-11-22 MED ORDER — SODIUM CHLORIDE 0.9 % IV SOLN: INTRAVENOUS | Status: DC

## 2023-11-22 MED ORDER — LIDOCAINE HCL (CARDIAC) PF 100 MG/5ML IV SOSY
PREFILLED_SYRINGE | INTRAVENOUS | Status: DC | PRN
  Administered 2023-11-22: 80 mg via INTRAVENOUS

## 2023-11-22 MED ORDER — PROPOFOL 10 MG/ML IV BOLUS
INTRAVENOUS | Status: DC | PRN
  Administered 2023-11-22: 50 mg via INTRAVENOUS
  Administered 2023-11-22: 30 mg via INTRAVENOUS

## 2023-11-22 MED ORDER — LIDOCAINE HCL (PF) 2 % IJ SOLN
INTRAMUSCULAR | Status: AC
  Filled 2023-11-22: qty 5

## 2023-11-22 NOTE — Anesthesia Postprocedure Evaluation (Signed)
 Anesthesia Post Note  Patient: Jeff Ryan  Procedure(s) Performed: COLONOSCOPY POLYPECTOMY, INTESTINE  Patient location during evaluation: Endoscopy Anesthesia Type: General Level of consciousness: awake and alert Pain management: pain level controlled Vital Signs Assessment: post-procedure vital signs reviewed and stable Respiratory status: spontaneous breathing, nonlabored ventilation, respiratory function stable and patient connected to nasal cannula oxygen Cardiovascular status: blood pressure returned to baseline and stable Postop Assessment: no apparent nausea or vomiting Anesthetic complications: no   No notable events documented.   Last Vitals:  Vitals:   11/22/23 0710 11/22/23 0812  BP: (!) 164/93 115/82  Pulse: 79 75  Resp: 18 14  Temp: (!) 36.2 C   SpO2: 100% 100%    Last Pain:  Vitals:   11/22/23 0812  TempSrc:   PainSc: 0-No pain                 Lattie Poli

## 2023-11-22 NOTE — H&P (Signed)
 Pre-Procedure H&P   Patient ID: Jeff Ryan is a 78 y.o. male.  Gastroenterology Provider: Quintin Buckle, DO  Referring Provider: Corrinne Din, NP PCP: Raina Bunting, DO  Date: 11/22/2023  HPI Jeff Ryan is a 78 y.o. male who presents today for Colonoscopy for Positive Cologuard; family history of colon cancer .  Cologuard positive in April  No previous colonoscopy.  Brother recently diagnosed with colon cancer (2 years older)  Patient Jeff Ryan has been held for the procedure.  Status post nephrectomy  Hemoglobin 15.5 MCV 95 platelets 1 47,000 creatinine 1.40  Past Medical History:  Diagnosis Date   Aortic atherosclerosis (HCC)    Atrial fibrillation with slow ventricular response (HCC) 03/26/2023   a.) noted on preop ECG 03/26/2023; b.) CHA2DS2-VASc: 4 (age x 2, HTN, vascular disease history); rate/rhythm maintained on oral metoprolol  tartrate; chronically anticoagulated using standard dose apixaban (will start DOAC postop once cleared by surgery on 04/05/2023).   Atrophy of kidney    Dyslipidemia    Epigastric hernia    Essential hypertension    Insomnia    Nephrolithiasis    On apixaban therapy 04/01/2023   Pre-diabetes    Systolic murmur 04/01/2023   a.) grade II/VI; mitral area   Umbilical hernia     Past Surgical History:  Procedure Laterality Date   CARDIOVERSION N/A 08/06/2023   Procedure: CARDIOVERSION;  Surgeon: Constancia Delton, MD;  Location: [facility] ORS;  Service: Cardiovascular;  Laterality: N/A;   CATARACT EXTRACTION, BILATERAL     left 10/26/2014 right 10/12/2014   EPIGASTRIC HERNIA REPAIR N/A 04/05/2023   Procedure: HERNIA REPAIR EPIGASTRIC ADULT;  Surgeon: Flynn Hylan, MD;  Location: [facility] ORS;  Service: General;  Laterality: N/A;   ROBOT ASSISTED LAPAROSCOPIC NEPHRECTOMY Left 04/05/2023   Procedure: XI ROBOTIC ASSISTED LAPAROSCOPIC NEPHRECTOMY;  Surgeon: Dustin Gimenez, MD;  Location: [facility] ORS;  Service:  Urology;  Laterality: Left;   TONSILLECTOMY AND ADENOIDECTOMY     US  ECHOCARDIOGRAPHY     10/2014   VASECTOMY      Family History Brother- crc  No other h/o GI disease or malignancy  Review of Systems  Constitutional:  Negative for activity change, appetite change, chills, diaphoresis, fatigue, fever and unexpected weight change.  HENT:  Negative for trouble swallowing and voice change.   Respiratory:  Negative for shortness of breath and wheezing.   Cardiovascular:  Negative for chest pain, palpitations and leg swelling.  Gastrointestinal:  Negative for abdominal distention, abdominal pain, anal bleeding, blood in stool, constipation, diarrhea, nausea and vomiting.  Musculoskeletal:  Negative for arthralgias and myalgias.  Skin:  Negative for color change and pallor.  Neurological:  Negative for dizziness, syncope and weakness.  Psychiatric/Behavioral:  Negative for confusion. The patient is not nervous/anxious.   All other systems reviewed and are negative.    Medications No current facility-administered medications on file prior to encounter.   Current Outpatient Medications on File Prior to Encounter  Medication Sig Dispense Refill   lisinopril  (ZESTRIL ) 40 MG tablet Take 1 tablet (40 mg total) by mouth daily. 90 tablet 3   metoprolol  tartrate (LOPRESSOR ) 25 MG tablet Take 25 mg by mouth 2 (two) times daily.     simvastatin  (ZOCOR ) 40 MG tablet Take 1 tablet (40 mg total) by mouth daily with supper. 90 tablet 3   traZODone  (DESYREL ) 100 MG tablet Take 1 tablet (100 mg total) by mouth at bedtime. 90 tablet 3   apixaban (Jeff Ryan) 5 MG TABS tablet Take 5 mg  by mouth 2 (two) times daily.     capsaicin (ZOSTRIX) 0.025 % cream Apply 1 Application topically 2 (two) times daily as needed (arthritis pain.).      Pertinent medications related to GI and procedure were reviewed by me with the patient prior to the procedure   Current Facility-Administered Medications:    0.9 %  sodium  chloride infusion, , Intravenous, Continuous, Quintin Buckle, DO, Last Rate: 20 mL/hr at 11/22/23 0720, New Bag at 11/22/23 0720  sodium chloride  20 mL/hr at 11/22/23 0720       No Known Allergies Allergies were reviewed by me prior to the procedure  Objective   Body mass index is 26.25 kg/m. Vitals:   11/22/23 0710  BP: (!) 164/93  Pulse: 79  Resp: 18  Temp: (!) 97.2 F (36.2 C)  TempSrc: Temporal  SpO2: 100%  Weight: 85.4 kg  Height: 5\' 11"  (1.803 m)     Physical Exam Vitals and nursing note reviewed.  Constitutional:      General: He is not in acute distress.    Appearance: Normal appearance. He is not ill-appearing, toxic-appearing or diaphoretic.  HENT:     Head: Normocephalic and atraumatic.     Nose: Nose normal.     Mouth/Throat:     Mouth: Mucous membranes are moist.     Pharynx: Oropharynx is clear.  Eyes:     General: No scleral icterus.    Extraocular Movements: Extraocular movements intact.  Cardiovascular:     Rate and Rhythm: Normal rate and regular rhythm.     Heart sounds: Normal heart sounds. No murmur heard.    No friction rub. No gallop.  Pulmonary:     Effort: Pulmonary effort is normal. No respiratory distress.     Breath sounds: Normal breath sounds. No wheezing, rhonchi or rales.  Abdominal:     General: Bowel sounds are normal. There is no distension.     Palpations: Abdomen is soft.     Tenderness: There is no abdominal tenderness. There is no guarding or rebound.  Musculoskeletal:     Cervical back: Neck supple.     Right lower leg: No edema.     Left lower leg: No edema.  Skin:    General: Skin is warm and dry.     Coloration: Skin is not jaundiced or pale.  Neurological:     General: No focal deficit present.     Mental Status: He is alert and oriented to person, place, and time. Mental status is at baseline.  Psychiatric:        Mood and Affect: Mood normal.        Behavior: Behavior normal.        Thought Content:  Thought content normal.        Judgment: Judgment normal.      Assessment:  Jeff Ryan is a 78 y.o. male  who presents today for Colonoscopy for Positive Cologuard; family history of colon cancer .  Plan:  Colonoscopy with possible intervention today  Colonoscopy with possible biopsy, control of bleeding, polypectomy, and interventions as necessary has been discussed with the patient/patient representative. Informed consent was obtained from the patient/patient representative after explaining the indication, nature, and risks of the procedure including but not limited to death, bleeding, perforation, missed neoplasm/lesions, cardiorespiratory compromise, and reaction to medications. Opportunity for questions was given and appropriate answers were provided. Patient/patient representative has verbalized understanding is amenable to undergoing the procedure.   Eugena Herter  Mamie Searles, DO  [facility]  Portions of the record may have been created with voice recognition software. Occasional wrong-word or 'sound-a-like' substitutions may have occurred due to the inherent limitations of voice recognition software.  Read the chart carefully and recognize, using context, where substitutions may have occurred.

## 2023-11-22 NOTE — Anesthesia Preprocedure Evaluation (Signed)
 Anesthesia Evaluation  Patient identified by MRN, date of birth, ID band Patient awake    Reviewed: Allergy & Precautions, NPO status , Patient's Chart, lab work & pertinent test results  History of Anesthesia Complications Negative for: history of anesthetic complications  Airway Mallampati: II  TM Distance: >3 FB Neck ROM: Full    Dental  (+) Upper Dentures, Partial Lower   Pulmonary neg pulmonary ROS, neg sleep apnea, neg COPD, Patient abstained from smoking.Not current smoker, former smoker   Pulmonary exam normal breath sounds clear to auscultation       Cardiovascular Exercise Tolerance: Good METShypertension, Pt. on medications (-) CAD and (-) Past MI (-) dysrhythmias  Rhythm:Irregular Rate:Normal - Systolic murmurs    Neuro/Psych negative neurological ROS  negative psych ROS   GI/Hepatic ,neg GERD  ,,(+)     (-) substance abuse    Endo/Other  neg diabetes    Renal/GU Renal disease     Musculoskeletal   Abdominal   Peds  Hematology   Anesthesia Other Findings Past Medical History: No date: Aortic atherosclerosis (HCC) 03/26/2023: Atrial fibrillation with slow ventricular response (HCC)     Comment:  a.) noted on preop ECG 03/26/2023; b.) CHA2DS2-VASc: 4               (age x 2, HTN, vascular disease history); rate/rhythm               maintained on oral metoprolol  tartrate; chronically               anticoagulated using standard dose apixaban (will start               DOAC postop once cleared by surgery on 04/05/2023). No date: Atrophy of kidney No date: Dyslipidemia No date: Epigastric hernia No date: Essential hypertension No date: Insomnia No date: Nephrolithiasis 04/01/2023: On apixaban therapy No date: Pre-diabetes 04/01/2023: Systolic murmur     Comment:  a.) grade II/VI; mitral area No date: Umbilical hernia  Reproductive/Obstetrics                              Anesthesia Physical Anesthesia Plan  ASA: 2  Anesthesia Plan: General   Post-op Pain Management: Minimal or no pain anticipated   Induction: Intravenous  PONV Risk Score and Plan: 2 and Propofol  infusion, TIVA and Ondansetron   Airway Management Planned: Nasal Cannula  Additional Equipment: None  Intra-op Plan:   Post-operative Plan:   Informed Consent: I have reviewed the patients History and Physical, chart, labs and discussed the procedure including the risks, benefits and alternatives for the proposed anesthesia with the patient or authorized representative who has indicated his/her understanding and acceptance.     Dental advisory given  Plan Discussed with: CRNA and Surgeon  Anesthesia Plan Comments: (Discussed risks of anesthesia with patient, including possibility of difficulty with spontaneous ventilation under anesthesia necessitating airway intervention, PONV, and rare risks such as cardiac or respiratory or neurological events, and allergic reactions. Discussed the role of CRNA in patient's perioperative care. Patient understands.)       Anesthesia Quick Evaluation

## 2023-11-22 NOTE — Transfer of Care (Signed)
 Immediate Anesthesia Transfer of Care Note  Patient: Jeff Ryan  Procedure(s) Performed: COLONOSCOPY POLYPECTOMY, INTESTINE  Patient Location: PACU  Anesthesia Type:General  Level of Consciousness: sedated  Airway & Oxygen Therapy: Patient Spontanous Breathing  Post-op Assessment: Report given to RN and Post -op Vital signs reviewed and stable  Post vital signs: Reviewed and stable  Last Vitals:  Vitals Value Taken Time  BP    Temp    Pulse    Resp    SpO2      Last Pain:  Vitals:   11/22/23 0710  TempSrc: Temporal  PainSc: 0-No pain         Complications: No notable events documented.

## 2023-11-22 NOTE — Interval H&P Note (Signed)
 History and Physical Interval Note: Preprocedure H&P from 11/22/23  was reviewed and there was no interval change after seeing and examining the patient.  Written consent was obtained from the patient after discussion of risks, benefits, and alternatives. Patient has consented to proceed with Colonoscopy with possible intervention   11/22/2023 7:37 AM  Louetta Roux  has presented today for surgery, with the diagnosis of Positive colorectal cancer screening using Cologuard test (R19.5).  The various methods of treatment have been discussed with the patient and family. After consideration of risks, benefits and other options for treatment, the patient has consented to  Procedure(s): COLONOSCOPY (N/A) as a surgical intervention.  The patient's history has been reviewed, patient examined, no change in status, stable for surgery.  I have reviewed the patient's chart and labs.  Questions were answered to the patient's satisfaction.     Quintin Buckle

## 2023-11-22 NOTE — Op Note (Signed)
 [facility] Patient Name: Jeff Ryan Procedure Date: 11/22/2023 7:29 AM MRN: [id number] Account #: [id number] Date of Birth: Sep 19, 1945 Admit Type: Outpatient Age: 78 Room: [facility] ENDO ROOM 2 Gender: Male Note Status: Finalized Instrument Name: Colonscope [id number] Procedure:             Colonoscopy Indications:           Positive Cologuard test, Family history of colon cancer Providers:             Bridgett Camps, DO Referring MD:          Raina Bunting (Referring MD) Medicines:             Monitored Anesthesia Care Complications:         No immediate complications. Estimated blood loss:                         Minimal. Procedure:             Pre-Anesthesia Assessment:                        - Prior to the procedure, a History and Physical was                         performed, and patient medications and allergies were                         reviewed. The patient is competent. The risks and                         benefits of the procedure and the sedation options and                         risks were discussed with the patient. All questions                         were answered and informed consent was obtained.                         Patient identification and proposed procedure were                         verified by the physician, the nurse, the anesthetist                         and the technician in the endoscopy suite. Mental                         Status Examination: alert and oriented. Airway                         Examination: normal oropharyngeal airway and neck                         mobility. Respiratory Examination: clear to                         auscultation. CV Examination: RRR, no murmurs, no S3  or S4. Prophylactic Antibiotics: The patient does not                         require prophylactic antibiotics. Prior                         Anticoagulants: The patient has taken  Eliquis                         (apixaban), last dose was 3 days prior to procedure.                         ASA Grade Assessment: III - A patient with severe                         systemic disease. After reviewing the risks and                         benefits, the patient was deemed in satisfactory                         condition to undergo the procedure. The anesthesia                         plan was to use monitored anesthesia care (MAC).                         Immediately prior to administration of medications,                         the patient was re-assessed for adequacy to receive                         sedatives. The heart rate, respiratory rate, oxygen                         saturations, blood pressure, adequacy of pulmonary                         ventilation, and response to care were monitored                         throughout the procedure. The physical status of the                         patient was re-assessed after the procedure.                        After obtaining informed consent, the colonoscope was                         passed under direct vision. Throughout the procedure,                         the patient's blood pressure, pulse, and oxygen                         saturations were monitored continuously. The  Colonoscope was introduced through the anus and                         advanced to the the cecum, identified by appendiceal                         orifice and ileocecal valve. The colonoscopy was                         performed without difficulty. The patient tolerated                         the procedure well. The quality of the bowel                         preparation was evaluated using the BBPS [facility] Bowel                         Preparation Scale) with scores of: Right Colon = 2                         (minor amount of residual staining, small fragments of                         stool and/or opaque liquid,  but mucosa seen well),                         Transverse Colon = 3 (entire mucosa seen well with no                         residual staining, small fragments of stool or opaque                         liquid) and Left Colon = 3 (entire mucosa seen well                         with no residual staining, small fragments of stool or                         opaque liquid). The total BBPS score equals 8. The                         quality of the bowel preparation was excellent. The                         ileocecal valve, appendiceal orifice, and rectum were                         photographed. Findings:      The perianal and digital rectal examinations were normal. Pertinent       negatives include normal sphincter tone.      Multiple small-mouthed diverticula were found in the entire colon. Most       prevalent in the left colon. Estimated blood loss: none.      A 7 to 8 mm polyp was found in the descending colon. The polyp was       sessile. The  polyp was removed with a cold snare. Resection and       retrieval were complete. Estimated blood loss was minimal.      A 2 to 3 mm polyp was found in the descending colon. The polyp was       sessile. The polyp was removed with a jumbo cold forceps. Resection and       retrieval were complete. Estimated blood loss was minimal.      Non-bleeding internal hemorrhoids were found during retroflexion. The       hemorrhoids were Grade I (internal hemorrhoids that do not prolapse).       Estimated blood loss: none.      Anal papilla(e) were hypertrophied. Estimated blood loss: none.      The exam was otherwise without abnormality on direct and retroflexion       views. Impression:            - Diverticulosis in the entire examined colon.                        - One 7 to 8 mm polyp in the descending colon, removed                         with a cold snare. Resected and retrieved.                        - One 2 to 3 mm polyp in the descending colon,  removed                         with a jumbo cold forceps. Resected and retrieved.                        - Non-bleeding internal hemorrhoids.                        - Anal papilla(e) were hypertrophied.                        - The examination was otherwise normal on direct and                         retroflexion views. Recommendation:        - Patient has a contact number available for                         emergencies. The signs and symptoms of potential                         delayed complications were discussed with the patient.                         Return to normal activities tomorrow. Written                         discharge instructions were provided to the patient.                        - Discharge patient to home.                        -  Resume previous diet.                        - Continue present medications.                        - No ibuprofen, naproxen, or other non-steroidal                         anti-inflammatory drugs for 5 days after polyp removal.                        - Resume Eliquis (apixaban) at prior dose tomorrow.                         Refer to managing physician for further adjustment of                         therapy.                        - Await pathology results.                        - Repeat colonoscopy for surveillance based on                         pathology results.                        - No further stool card testing given family and                         personal history.                        - The findings and recommendations were discussed with                         the patient. Procedure Code(s):     --- Professional ---                        [phone], Colonoscopy, flexible; with removal of                         tumor(s), polyp(s), or other lesion(s) by snare                         technique                        45380, 59, Colonoscopy, flexible; with biopsy, single                         or multiple Diagnosis  Code(s):     --- Professional ---                        K64.0, First degree hemorrhoids                        D12.4, Benign neoplasm of descending colon  K62.89, Other specified diseases of anus and rectum                        R19.5, Other fecal abnormalities                        Z80.0, Family history of malignant neoplasm of                         digestive organs                        K57.30, Diverticulosis of large intestine without                         perforation or abscess without bleeding CPT copyright 2022 American [employer] Association. All rights reserved. The codes documented in this report are preliminary and upon coder review may  be revised to meet current compliance requirements. Attending Participation:      I personally performed the entire procedure. Polo Brisk, DO Quintin Buckle DO, DO 11/22/2023 8:18:48 AM This report has been signed electronically. Number of Addenda: 0 Note Initiated On: 11/22/2023 7:29 AM Scope Withdrawal Time: 0 hours 15 minutes 52 seconds  Total Procedure Duration: 0 hours 22 minutes 54 seconds  Estimated Blood Loss:  Estimated blood loss was minimal.      [facility]

## 2023-11-23 LAB — SURGICAL PATHOLOGY

## 2023-12-07 ENCOUNTER — Ambulatory Visit: Payer: Medicare Other | Admitting: Cardiology

## 2023-12-20 ENCOUNTER — Encounter: Payer: Self-pay | Admitting: Cardiology

## 2023-12-20 ENCOUNTER — Ambulatory Visit: Attending: Cardiology | Admitting: Cardiology

## 2023-12-20 VITALS — BP 130/78 | HR 63 | Ht 71.0 in | Wt 196.0 lb

## 2023-12-20 DIAGNOSIS — I4819 Other persistent atrial fibrillation: Secondary | ICD-10-CM

## 2023-12-20 DIAGNOSIS — I10 Essential (primary) hypertension: Secondary | ICD-10-CM | POA: Diagnosis not present

## 2023-12-20 DIAGNOSIS — E782 Mixed hyperlipidemia: Secondary | ICD-10-CM | POA: Diagnosis not present

## 2023-12-20 MED ORDER — RIVAROXABAN 20 MG PO TABS: 20.0000 mg | ORAL_TABLET | Freq: Every day | ORAL | 6 refills | Status: DC

## 2023-12-20 NOTE — Progress Notes (Signed)
 Cardiology Office Note   Date:  12/20/2023  ID:  Jeff Ryan, DOB December 21, 1945, MRN [id number] PCP: Raina Bunting, DO  [facility] Providers Cardiologist:  Constancia Delton, MD Electrophysiologist:  Boyce Byes, MD     History of Present Illness Jeff Ryan is a 78 y.o. male with a past medical history of persistent atrial fibrillation, hypertension, hyperlipidemia, who presents today for follow-up.  Prior echocardiogram completed by [facility] cardiology 03/2023 showed normal systolic function EF of 50-55%.  He had undergone a cardioversion procedure in 08/02/2023 for persistent atrial fibrillation with successful conversion to sinus rhythm after being shocked 1 time at 200 J.  He converted to normal sinus rhythm/sinus bradycardia with a rate of 52.  There were no postprocedure complications noted.  And he had been compliant with Eliquis and Lopressor  as prescribed.  Was referred to EP.  He was last seen in clinic 09/22/2023 by Dr. Marven Slimmer.  He was doing well and was unaware of his A-fib.  He continued to remain active with using a push mower to do his yard work.  Had no shortness of breath or fatigue or lightheadedness.  Was tolerating his Eliquis without issues of bleeding.  After long discussion about asymptomatic nature of his A-fib and normal EF on echocardiogram he he had elected to proceed with rate control alone which was reasonable.  He returns to clinic today stating that overall from the cardiac perspective he has been doing well.  He does have concerns with taking apixaban as he has not missed any doses but stated that he continues to have worsening joint pain since being on anticoagulation with apixaban.  He said originally it started in his lower back and and was worked into his hips and his shoulders.  He was requesting to determine if there was an additional medication that he can take in its place.  He denies any chest pain, shortness of breath or  palpitations.  States he has not been compliant with his current medication regimen with no signs of bleeding with no blood noted in his urine or stool.  Denies any hospitalizations or visits to the emergency department.  ROS: Point review of system has been reviewed and considered negative with the exception of which are listed in the HPI  Studies Reviewed EKG Interpretation Date/Time:  Monday December 20 2023 10:06:42 EDT Ventricular Rate:  63 PR Interval:    QRS Duration:  70 QT Interval:  394 QTC Calculation: 403 R Axis:   -30  Text Interpretation: Atrial fibrillation Left axis deviation Inferior infarct , age undetermined When compared with ECG of 22-Sep-2023 10:22, No significant change was found Confirmed by Ronald Cockayne ([id number]) on 12/20/2023 10:13:07 AM    2D echo ([facility]) 04/01/2023 INTERPRETATION  NORMAL LEFT VENTRICULAR SYSTOLIC FUNCTION  NORMAL RIGHT VENTRICULAR SYSTOLIC FUNCTION  MILD VALVULAR REGURGITATION (See above)  NO VALVULAR STENOSIS  MILD mitral/tricuspid regurgitation  LVEF 50-55%   Risk Assessment/Calculations  CHA2DS2-VASc Score = 3   This indicates a 3.2% annual risk of stroke. The patient's score is based upon: CHF History: 0 HTN History: 1 Diabetes History: 0 Stroke History: 0 Vascular Disease History: 0 Age Score: 2 Gender Score: 0            Physical Exam VS:  BP 130/78 (BP Location: Left Arm, Patient Position: Sitting, Cuff Size: Normal)   Pulse 63   Ht 5\' 11"  (1.803 m)   Wt 196 lb (88.9 kg)   SpO2 97%   BMI 27.34  kg/m    Wt Readings from Last 3 Encounters:  12/20/23 196 lb (88.9 kg)  11/22/23 188 lb 3.2 oz (85.4 kg)  09/22/23 190 lb (86.2 kg)    GEN: Well nourished, well developed in no acute distress NECK: No JVD; No carotid bruits CARDIAC: IR IR, no murmurs, rubs, gallops RESPIRATORY:  Clear to auscultation without rales, wheezing or rhonchi  ABDOMEN: Soft, non-tender, non-distended EXTREMITIES:  No edema; No deformity    ASSESSMENT AND PLAN Persistent atrial fibrillation status post failed DC conversion to 08/02/2023.  Patient continues to remain in atrial fibrillation with rate control on EKG today with a rate of 63, left axis deviation, with no significant changes noted.  He is continued on metoprolol  tartrate 25 mg twice daily for rate control.  With continued complaints of joint pain on apixaban 5 mg twice daily he is being changed to Xarelto 20 mg daily.  30-day supply is being sent into the pharmacy of choice that he will continue his apixaban until complete and then changed to apixaban.  For his CHA2DS2-VASc score of at least 3 for stroke prophylaxis.  His not had any adverse side effects from oral anticoagulant therapy with no bleeding reported and no blood noted in his urine or stool.  Primary hypertension with blood pressure today 130/78.  Blood pressure has been well-controlled on lisinopril  40 mg daily and metoprolol  tartrate 25 g twice daily.  He has been encouraged to continue to monitor his pressure 1 to 2 hours postmedication administration as well.  Mixed hyperlipidemia with LDL of 86.  He is continued on simvastatin  40 mg daily.  Ongoing management per PCP.  Previously patient had complained of muscle aches and had held simvastatin  for 2 weeks with no improvement and restarted his simvastatin  without recurrent change in symptoms.       Dispo: Patient to return to clinic to see MD/APP in 3 months or sooner if needed for further evaluation  Signed, Azani Brogdon, NP

## 2023-12-20 NOTE — Patient Instructions (Signed)
 Medication Instructions:  Your physician recommends the following medication changes.  STOP TAKING: Eliquis   START TAKING: Xarelto 20 mg daily  *If you need a refill on your cardiac medications before your next appointment, please call your pharmacy*  Lab Work: No labs ordered today  If you have labs (blood work) drawn today and your tests are completely normal, you will receive your results only by: MyChart Message (if you have MyChart) OR A paper copy in the mail If you have any lab test that is abnormal or we need to change your treatment, we will call you to review the results.  Testing/Procedures: No test ordered today   Follow-Up: At [facility], you and your health needs are our priority.  As part of our continuing mission to provide you with exceptional heart care, our providers are all part of one team.  This team includes your primary Cardiologist (physician) and Advanced Practice Providers or APPs (Physician Assistants and Nurse Practitioners) who all work together to provide you with the care you need, when you need it.  Your next appointment:   3 month(s)  Provider:   Constancia Delton, MD or Ronald Cockayne, NP

## 2023-12-27 DIAGNOSIS — G4709 Other insomnia: Secondary | ICD-10-CM | POA: Diagnosis not present

## 2023-12-27 DIAGNOSIS — E785 Hyperlipidemia, unspecified: Secondary | ICD-10-CM | POA: Diagnosis not present

## 2023-12-27 DIAGNOSIS — N200 Calculus of kidney: Secondary | ICD-10-CM | POA: Diagnosis not present

## 2023-12-27 DIAGNOSIS — R7303 Prediabetes: Secondary | ICD-10-CM | POA: Diagnosis not present

## 2023-12-27 DIAGNOSIS — I10 Essential (primary) hypertension: Secondary | ICD-10-CM | POA: Diagnosis not present

## 2023-12-29 DIAGNOSIS — K56609 Unspecified intestinal obstruction, unspecified as to partial versus complete obstruction: Secondary | ICD-10-CM | POA: Diagnosis not present

## 2023-12-29 DIAGNOSIS — I10 Essential (primary) hypertension: Secondary | ICD-10-CM | POA: Diagnosis not present

## 2023-12-29 DIAGNOSIS — R112 Nausea with vomiting, unspecified: Secondary | ICD-10-CM | POA: Diagnosis not present

## 2024-01-05 DIAGNOSIS — I10 Essential (primary) hypertension: Secondary | ICD-10-CM | POA: Diagnosis not present

## 2024-01-05 DIAGNOSIS — G4709 Other insomnia: Secondary | ICD-10-CM | POA: Diagnosis not present

## 2024-01-05 DIAGNOSIS — N261 Atrophy of kidney (terminal): Secondary | ICD-10-CM | POA: Diagnosis not present

## 2024-01-05 DIAGNOSIS — N1330 Unspecified hydronephrosis: Secondary | ICD-10-CM | POA: Diagnosis not present

## 2024-01-05 DIAGNOSIS — N1831 Chronic kidney disease, stage 3a: Secondary | ICD-10-CM | POA: Diagnosis not present

## 2024-01-05 DIAGNOSIS — N200 Calculus of kidney: Secondary | ICD-10-CM | POA: Diagnosis not present

## 2024-01-05 DIAGNOSIS — Z905 Acquired absence of kidney: Secondary | ICD-10-CM | POA: Diagnosis not present

## 2024-01-05 DIAGNOSIS — N281 Cyst of kidney, acquired: Secondary | ICD-10-CM | POA: Diagnosis not present

## 2024-01-05 DIAGNOSIS — R3129 Other microscopic hematuria: Secondary | ICD-10-CM | POA: Diagnosis not present

## 2024-01-05 DIAGNOSIS — Z9889 Other specified postprocedural states: Secondary | ICD-10-CM | POA: Diagnosis not present

## 2024-01-05 DIAGNOSIS — R7303 Prediabetes: Secondary | ICD-10-CM | POA: Diagnosis not present

## 2024-01-05 DIAGNOSIS — E785 Hyperlipidemia, unspecified: Secondary | ICD-10-CM | POA: Diagnosis not present

## 2024-01-13 ENCOUNTER — Ambulatory Visit: Admitting: Physician Assistant

## 2024-01-13 ENCOUNTER — Ambulatory Visit: Payer: Self-pay | Admitting: Physician Assistant

## 2024-01-13 ENCOUNTER — Ambulatory Visit
Admission: RE | Admit: 2024-01-13 | Discharge: 2024-01-13 | Disposition: A | Discharge status: Home / Self Care | Source: Ambulatory Visit | Attending: Physician Assistant | Admitting: Physician Assistant

## 2024-01-13 VITALS — BP 169/101 | HR 91 | Ht 71.0 in | Wt 185.0 lb

## 2024-01-13 DIAGNOSIS — N2882 Megaloureter: Secondary | ICD-10-CM | POA: Diagnosis not present

## 2024-01-13 DIAGNOSIS — R310 Gross hematuria: Secondary | ICD-10-CM | POA: Insufficient documentation

## 2024-01-13 DIAGNOSIS — Z905 Acquired absence of kidney: Secondary | ICD-10-CM | POA: Diagnosis not present

## 2024-01-13 DIAGNOSIS — R319 Hematuria, unspecified: Secondary | ICD-10-CM | POA: Diagnosis not present

## 2024-01-13 DIAGNOSIS — K5730 Diverticulosis of large intestine without perforation or abscess without bleeding: Secondary | ICD-10-CM | POA: Diagnosis not present

## 2024-01-13 DIAGNOSIS — N201 Calculus of ureter: Secondary | ICD-10-CM | POA: Diagnosis not present

## 2024-01-13 LAB — MICROSCOPIC EXAMINATION: RBC, Urine: >30 /HPF — AB (ref 0–2)

## 2024-01-13 LAB — URINALYSIS, COMPLETE
Bilirubin, UA: NEGATIVE
Glucose, UA: NEGATIVE
Ketones, UA: NEGATIVE
Leukocytes,UA: NEGATIVE
Nitrite, UA: NEGATIVE
Specific Gravity, UA: 1.02 (ref 1.005–1.030)
Urobilinogen, Ur: 1 mg/dL (ref 0.2–1.0)
pH, UA: 5.5 (ref 5.0–7.5)

## 2024-01-13 NOTE — Patient Instructions (Signed)

## 2024-01-13 NOTE — Progress Notes (Signed)
 01/13/2024 2:06 PM   Elsie Dais January 13, 1946 [id number]  CC: Chief Complaint  Patient presents with   Follow-up   Hematuria   HPI: Clovis Mankins is a 78 y.o. male with solitary right kidney s/p left simple nephrectomy with Dr. Penne on 04/05/2023 for management of severe left renal atrophy secondary to chronic obstruction secondary to distal left ureteral stone who presents today for evaluation of gross hematuria.   Today he reports 5 days of intermittent gross hematuria with some right flank discomfort as of this morning.  He denies fever, chills, nausea, vomiting, anorexia, constipation, or diarrhea.  Notably, he was switched from Eliquis to Xarelto  about 3 weeks ago due to joint pain.  Last meal was at 11 AM this morning.  In-office UA today positive for 1+ protein and 3+ blood; urine microscopy with >30 RBCs/HPF.  He was sent from clinic for a stat CT stone study, which showed no right urolithiasis or hydronephrosis.  PMH: Past Medical History:  Diagnosis Date   Aortic atherosclerosis (HCC)    Atrial fibrillation with slow ventricular response (HCC) 03/26/2023   a.) noted on preop ECG 03/26/2023; b.) CHA2DS2-VASc: 4 (age x 2, HTN, vascular disease history); rate/rhythm maintained on oral metoprolol  tartrate; chronically anticoagulated using standard dose apixaban (will start DOAC postop once cleared by surgery on 04/05/2023).   Atrophy of kidney    Dyslipidemia    Epigastric hernia    Essential hypertension    Insomnia    Nephrolithiasis    On apixaban therapy 04/01/2023   Pre-diabetes    Systolic murmur 04/01/2023   a.) grade II/VI; mitral area   Umbilical hernia     Surgical History: Past Surgical History:  Procedure Laterality Date   CARDIOVERSION N/A 08/06/2023   Procedure: CARDIOVERSION;  Surgeon: Darliss Rogue, MD;  Location: [facility];  Service: Cardiovascular;  Laterality: N/A;   CATARACT EXTRACTION, BILATERAL     left 10/26/2014 right  10/12/2014   COLONOSCOPY N/A 11/22/2023   Procedure: COLONOSCOPY;  Surgeon: Onita Elspeth Sharper, DO;  Location: [facility];  Service: Gastroenterology;  Laterality: N/A;   EPIGASTRIC HERNIA REPAIR N/A 04/05/2023   Procedure: HERNIA REPAIR EPIGASTRIC ADULT;  Surgeon: Lane Shope, MD;  Location: [facility] OR[facility];  Service: General;  Laterality: N/A;   POLYPECTOMY  11/22/2023   Procedure: POLYPECTOMY, INTESTINE;  Surgeon: Onita Elspeth Sharper, DO;  Location: [facility] END[facility];  Service: Gastroenterology;;   ROBOT ASSISTED LAPAROSCOPIC NEPHRECTOMY Left 04/05/2023   Procedure: XI ROBOTIC ASSISTED LAPAROSCOPIC NEPHRECTOMY;  Surgeon: Penne Knee, MD;  Location: [facility] ORS;  Service: Urology;  Laterality: Left;   TONSILLECTOMY AND ADENOIDECTOMY     US  ECHOCARDIOGRAPHY     10/2014   VASECTOMY      Home Medications:  Allergies as of 01/13/2024   No Known Allergies      Medication List        Accurate as of January 13, 2024  2:06 PM. If you have any questions, ask your nurse or doctor.          capsaicin 0.025 % cream Commonly known as: ZOSTRIX Apply 1 Application topically 2 (two) times daily as needed (arthritis pain.).   lisinopril  40 MG tablet Commonly known as: ZESTRIL  Take 1 tablet (40 mg total) by mouth daily.   metoprolol  tartrate 25 MG tablet Commonly known as: LOPRESSOR  Take 25 mg by mouth 2 (two) times daily.   rivaroxaban  20 MG Tabs tablet Commonly known as: XARELTO  Take 1 tablet (20 mg total) by mouth daily with supper.  simvastatin  40 MG tablet Commonly known as: ZOCOR  Take 1 tablet (40 mg total) by mouth daily with supper.   traZODone  100 MG tablet Commonly known as: DESYREL  Take 1 tablet (100 mg total) by mouth at bedtime.        Allergies:  No Known Allergies  Family History: Family History  Problem Relation Age of Onset   Heart disease Brother    Heart attack Brother        x 3   Prostate cancer Neg Hx    Colon cancer Neg Hx     Social History:    reports that he quit smoking about 17 years ago. His smoking use included cigarettes. He has never used smokeless tobacco. He reports current alcohol use of about 7.0 standard drinks of alcohol per week. He reports that he does not use drugs.  Physical Exam: BP (!) 169/101   Pulse 91   Ht 5' 11 (1.803 m)   Wt 185 lb (83.9 kg)   BMI 25.80 kg/m   Constitutional:  Alert and oriented, no acute distress, nontoxic appearing HEENT: [place], AT Cardiovascular: No clubbing, cyanosis, or edema Respiratory: Normal respiratory effort, no increased work of breathing Skin: No rashes, bruises or suspicious lesions Neurologic: Grossly intact, no focal deficits, moving all 4 extremities Psychiatric: Normal mood and affect  Laboratory Data: Results for orders placed or performed in visit on 01/13/24  Microscopic Examination   Collection Time: 01/13/24  1:50 PM   Urine  Result Value Ref Range   WBC, UA 0-5 0 - 5 /hpf   RBC, Urine >30 (A) 0 - 2 /hpf   Epithelial Cells (non renal) 0-10 0 - 10 /hpf   Bacteria, UA Few None seen/Few  Urinalysis, Complete   Collection Time: 01/13/24  1:50 PM  Result Value Ref Range   Specific Gravity, UA 1.020 1.005 - 1.030   pH, UA 5.5 5.0 - 7.5   Color, UA Red (A) Yellow   Appearance Ur Cloudy (A) Clear   Leukocytes,UA Negative Negative   Protein,UA 1+ (A) Negative/Trace   Glucose, UA Negative Negative   Ketones, UA Negative Negative   RBC, UA 3+ (A) Negative   Bilirubin, UA Negative Negative   Urobilinogen, Ur 1.0 0.2 - 1.0 mg/dL   Nitrite, UA Negative Negative   Microscopic Examination See below:    Pertinent Imaging: CT stone study, 01/13/2024: EXAM: CT ABDOMEN AND PELVIS WITHOUT CONTRAST   TECHNIQUE: Multidetector CT imaging of the abdomen and pelvis was performed following the standard protocol without IV contrast.   RADIATION DOSE REDUCTION: This exam was performed according to the departmental dose-optimization program which includes  automated exposure control, adjustment of the mA and/or kV according to patient size and/or use of iterative reconstruction technique.   COMPARISON:  None Available.   FINDINGS: Lower chest: No acute abnormality.   Hepatobiliary: Redemonstration of multiple subcentimeter hypodensities of well as pericentimeter fluid densities likely represent simple hepatic cysts. No gallstones, gallbladder wall thickening, or pericholecystic fluid. No biliary dilatation.   Pancreas: No focal lesion. Normal pancreatic contour. No surrounding inflammatory changes. No main pancreatic ductal dilatation.   Spleen: Normal in size without focal abnormality.   Adrenals/Urinary Tract:   No adrenal nodule bilaterally.   Status post left nephrectomy. Retained mid to distal dilated left ureter with left ureterovesicular junction 2.7 cm stone again noted. Duplicated right collecting system with slight fullness of the inferior moiety ureter. No frank hydroureteronephrosis. No nephroureterolithiasis. Renal fluid density lesions likely represent simple  renal cysts. Simple renal cysts, in the absence of clinically indicated signs/symptoms, require no independent follow-up.   The urinary bladder is unremarkable.   Stomach/Bowel: Stomach is within normal limits. No evidence of bowel wall thickening or dilatation. Colonic diverticulosis. Appendix appears normal.   Vascular/Lymphatic: No abdominal aorta aneurysm. Aneurysmal common iliac artery on the right measuring 1.9 cm. Severe atherosclerotic plaque of the aorta and its branches. No abdominal, pelvic, or inguinal lymphadenopathy.   Reproductive: Prominent prostate measuring 4.7 cm.   Other: No intraperitoneal free fluid. No intraperitoneal free gas. No organized fluid collection.   Musculoskeletal:   No abdominal wall hernia or abnormality.   No suspicious lytic or blastic osseous lesions. No acute displaced fracture. Multilevel degenerative  changes of the spine.   IMPRESSION: 1. Duplicated right collecting system with slight fullness of the inferior moiety ureter. No frank hydroureteronephrosis. No nephroureterolithiasis. 2. Status post left nephrectomy. Retained mid to distal dilated left ureter with left ureterovesicular junction 2.7 cm stone again noted. 3. Colonic diverticulosis with no acute diverticulitis. 4. Aneurysmal common iliac artery on the right measuring 1.9 cm. 5. Aortic Atherosclerosis (ICD10-I70.0) including coronary artery calcification.     Electronically Signed   By: Morgane  Naveau M.D.   On: 01/13/2024 16:40  I personally reviewed the images referenced above and note nonobstructing right ureteral stones.  Assessment & Plan:   1. Gross hematuria (Primary) Intermittent gross hematuria in this patient with a history of stones and a solitary right kidney.  I sent him for stat CT stone study today, with no evidence of obstructing stone on the right side.  In the absence of an obstructing right ureteral stone, we discussed that the most likely sources of his bleeding include prostatic bleeding with his Xarelto  or possibly bleeding from his retained distal left ureteral stone.  Will schedule cystoscopy for further evaluation.  He expressed understanding. - Urinalysis, Complete - CT RENAL STONE STUDY; Future   Return for Will call to schedule cystoscopy.  Lucie Hones, PA-C  [facility] [place] [address] [place], [place] [address] [phone]

## 2024-01-15 ENCOUNTER — Emergency Department
Admission: EM | Admit: 2024-01-15 | Discharge: 2024-01-15 | Disposition: A | Discharge status: Home / Self Care | Attending: Emergency Medicine | Admitting: Emergency Medicine

## 2024-01-15 DIAGNOSIS — I10 Essential (primary) hypertension: Secondary | ICD-10-CM | POA: Diagnosis not present

## 2024-01-15 DIAGNOSIS — I129 Hypertensive chronic kidney disease with stage 1 through stage 4 chronic kidney disease, or unspecified chronic kidney disease: Secondary | ICD-10-CM | POA: Insufficient documentation

## 2024-01-15 DIAGNOSIS — N189 Chronic kidney disease, unspecified: Secondary | ICD-10-CM | POA: Diagnosis not present

## 2024-01-15 DIAGNOSIS — Z7901 Long term (current) use of anticoagulants: Secondary | ICD-10-CM | POA: Diagnosis not present

## 2024-01-15 LAB — BASIC METABOLIC PANEL WITH GFR
Anion gap: 11 (ref 5–15)
BUN: 21 mg/dL (ref 8–23)
CO2: 26 mmol/L (ref 22–32)
Calcium: 9 mg/dL (ref 8.9–10.3)
Chloride: 101 mmol/L (ref 98–111)
Creatinine, Ser: 1.14 mg/dL (ref 0.61–1.24)
GFR, Estimated: >60 mL/min (ref >60)
Glucose, Bld: 98 mg/dL (ref 70–99)
Potassium: 3.7 mmol/L (ref 3.5–5.1)
Sodium: 138 mmol/L (ref 135–145)

## 2024-01-15 LAB — CBC
HCT: 42.8 % (ref 39.0–52.0)
Hemoglobin: 14.4 g/dL (ref 13.0–17.0)
MCH: 31.8 pg (ref 26.0–34.0)
MCHC: 33.6 g/dL (ref 30.0–36.0)
MCV: 94.5 fL (ref 80.0–100.0)
Platelets: 268 K/uL (ref 150–400)
RBC: 4.53 MIL/uL (ref 4.22–5.81)
RDW: 12.6 % (ref 11.5–15.5)
WBC: 6.5 K/uL (ref 4.0–10.5)
nRBC: 0 % (ref 0.0–0.2)

## 2024-01-15 NOTE — ED Provider Notes (Signed)
 [facility] Provider Note    Event Date/Time   First MD Initiated Contact with Patient 01/15/24 [phone]     (approximate)   History   Hypertension   HPI  Jeff Ryan is a 78 y.o. male history of chronic kidney disease hematuria nephrolithiasis.  Has plan for upcoming cystoscopy, also has atrial fibrillation on Xarelto    Reports he had a CT scan done a few days ago and told his blood pressure was high probably get checked out.  Since then he has been checking his blood pressure at home and is noticed that it will run as high as 180/100 but it does vary.  He took his lisinopril  metoprolol  this morning.  Last night he took an extra half tablet of lisinopril  because his blood pressure was elevated in the evening  He reports no symptoms he is doing well.  He mowed the lawn and worked outside for 2 hours yesterday.  He had blood in his urine but that has since cleared up over the last day as well and he is not having any abdominal pain or other symptoms.  He is compliant with his medications  Physical Exam   Triage Vital Signs: ED Triage Vitals  Encounter Vitals Group     BP 01/15/24 0737 (!) 172/100     Girls Systolic BP Percentile --      Girls Diastolic BP Percentile --      Boys Systolic BP Percentile --      Boys Diastolic BP Percentile --      Pulse Rate 01/15/24 0737 89     Resp 01/15/24 0737 18     Temp 01/15/24 0737 97.6 F (36.4 C)     Temp Source 01/15/24 0737 Oral     SpO2 01/15/24 0737 99 %     Weight 01/15/24 0738 185 lb (83.9 kg)     Height 01/15/24 0738 5' 11 (1.803 m)     Head Circumference --      Peak Flow --      Pain Score 01/15/24 0738 0     Pain Loc --      Pain Education --      Exclude from Growth Chart --     Most recent vital signs: Vitals:   01/15/24 0737 01/15/24 0745  BP: (!) 172/100 (!) 166/100  Pulse: 89 90  Resp: 18   Temp: 97.6 F (36.4 C)   SpO2: 99% 100%    He specifically denies any headache  shortness of breath swelling chest pain  General: Awake, no distress.  CV:  Good peripheral perfusion.  Normal tones rate Resp:  Normal effort.  Clear bilateral Abd:  No distention.  Soft nontender nondistended Other:  No lower extremity edema   ED Results / Procedures / Treatments   Labs (all labs ordered are listed, but only abnormal results are displayed) Labs Reviewed  BASIC METABOLIC PANEL WITH GFR  CBC     EKG  EKG interpreted by me at 840 heart rate 65 QRS 110 QTc 430 Atrial fibrillation no ischemia   RADIOLOGY     PROCEDURES:  Critical Care performed: No  Procedures   MEDICATIONS ORDERED IN ED: Medications - No data to display   IMPRESSION / MDM / ASSESSMENT AND PLAN / ED COURSE  I reviewed the triage vital signs and the nursing notes.  Differential diagnosis includes, but is not limited to, asymptomatic hypertension, secondary hypertension due to worsening renal disease, poor creatinine clearance, acute anemia, etc.  Overall reassuring exam he is completely asymptomatic.  While talking with the patient we rechecked his blood pressure and his come down to 149/79.  Patient also tells me that when he stresses about things when he goes to his doctor's office his blood pressure be very high but then later after he calms down his blood pressure will be rechecked by the doctor and it typically comes back down.  Query if there could be an element of whitecoat hypertension  Overall he is completely asymptomatic.  His renal function is to his baseline and his blood pressure has improved without intervention   Patient's presentation is most consistent with acute complicated illness / injury requiring diagnostic workup.        ----------------------------------------- 8:47 AM on 01/15/2024 ----------------------------------------- Blood pressure 130/75  Resting comfortable without distress.  Patient will continue to log and  monitor blood pressures and will call primary care office for follow-up. Return precautions and treatment recommendations and follow-up discussed with the patient who is agreeable with the plan.    FINAL CLINICAL IMPRESSION(S) / ED DIAGNOSES   Final diagnoses:  Hypertension, unspecified type     Rx / DC Orders   ED Discharge Orders     None        Note:  This document was prepared using Dragon voice recognition software and may include unintentional dictation errors.   Dicky Anes, MD 01/15/24 [phone]

## 2024-01-15 NOTE — ED Triage Notes (Signed)
 Pt c/o elevated BP x4 and hematuria x1 week.  Pt reports he has been taking additional doses of his HTN medication to manage.  Pt reports he takes Lisinopril .    Pt reports he had a CT scan for hematuria on Thursday.  Sts urine was clear this morning.

## 2024-02-08 ENCOUNTER — Ambulatory Visit: Admitting: Urology

## 2024-02-08 VITALS — BP 157/92 | HR 82 | Ht 71.0 in | Wt 200.4 lb

## 2024-02-08 DIAGNOSIS — R310 Gross hematuria: Secondary | ICD-10-CM | POA: Diagnosis not present

## 2024-02-08 MED ORDER — LIDOCAINE HCL URETHRAL/MUCOSAL 2 % EX GEL
1.0000 | Freq: Once | CUTANEOUS | Status: AC
  Administered 2024-02-08: 1 via URETHRAL

## 2024-02-08 NOTE — Progress Notes (Signed)
 Cystoscopy Procedure Note:  Indication: Gross hematuria  78 year old male previously managed by Dr. Penne, underwent simple left robotic nephrectomy with Dr. Penne September 2024 for severe left renal atrophy with chronic obstruction from a large 2 cm left distal ureteral stone.   After informed consent and discussion of the procedure and its risks, Jeff Ryan was positioned and prepped in the standard fashion. Cystoscopy was performed with a flexible cystoscope. The urethra, bladder neck and entire bladder was visualized in a standard fashion. The prostate was moderate in size. The ureteral orifices were visualized in their normal location and orientation.  Bladder mucosa grossly normal throughout, no abnormalities on retroflexion  Imaging: CT 01/13/2024 reviewed, duplicated collecting system on the right, no hydronephrosis or stones.  Left kidney surgically absent, retained left ureter with large 2.5 cm left distal ureteral stone with dilated ureter above.  Findings: Normal cystoscopy  Assessment and Plan: Negative gross hematuria workup, RTC 1 year, doing well at that time can likely follow-up as needed  Jeff Burnet, MD 02/08/2024

## 2024-02-10 ENCOUNTER — Other Ambulatory Visit: Payer: Self-pay | Admitting: Family Medicine

## 2024-02-10 ENCOUNTER — Ambulatory Visit: Payer: Self-pay | Admitting: Family Medicine

## 2024-02-10 VITALS — BP 132/84 | HR 62 | Ht 71.0 in | Wt 200.5 lb

## 2024-02-10 DIAGNOSIS — N183 Chronic kidney disease, stage 3 unspecified: Secondary | ICD-10-CM | POA: Diagnosis not present

## 2024-02-10 DIAGNOSIS — Z0000 Encounter for general adult medical examination without abnormal findings: Secondary | ICD-10-CM

## 2024-02-10 DIAGNOSIS — R7303 Prediabetes: Secondary | ICD-10-CM | POA: Diagnosis not present

## 2024-02-10 DIAGNOSIS — R351 Nocturia: Secondary | ICD-10-CM

## 2024-02-10 DIAGNOSIS — R739 Hyperglycemia, unspecified: Secondary | ICD-10-CM | POA: Diagnosis not present

## 2024-02-10 DIAGNOSIS — R310 Gross hematuria: Secondary | ICD-10-CM

## 2024-02-10 DIAGNOSIS — E782 Mixed hyperlipidemia: Secondary | ICD-10-CM

## 2024-02-10 DIAGNOSIS — I4819 Other persistent atrial fibrillation: Secondary | ICD-10-CM

## 2024-02-10 DIAGNOSIS — I129 Hypertensive chronic kidney disease with stage 1 through stage 4 chronic kidney disease, or unspecified chronic kidney disease: Secondary | ICD-10-CM

## 2024-02-10 DIAGNOSIS — N261 Atrophy of kidney (terminal): Secondary | ICD-10-CM

## 2024-02-10 LAB — POCT GLYCOSYLATED HEMOGLOBIN (HGB A1C): Hemoglobin A1C: 5.7 % — AB (ref 4.0–5.6)

## 2024-02-10 NOTE — Patient Instructions (Addendum)
 Thank you for coming to the office today.  Recent Labs    08/04/23 0836 02/10/24 0925  HGBA1C 5.9* 5.7*    BP improved today  Future Consideration - if you want to do the scan, please call back and let me know  Coronary Calcium  Score Cardiac CT Scan. This is a screening test for patients aged 78-50+ with cardiovascular risk factors or who are healthy but would be interested in Cardiovascular Screening for heart disease. Even if there is a family history of heart disease, this imaging can be useful. Typically it can be done every 5+ years or at a different timeline we agree on  The scan will look at the chest and mainly focus on the heart and identify early signs of calcium  build up or blockages within the heart arteries. It is not 100% accurate for identifying blockages or heart disease, but it is useful to help us  predict who may have some early changes or be at risk in the future for a heart attack or cardiovascular problem.  The results are reviewed by a Cardiologist and they will document the results. It should become available on MyChart. Typically the results are divided into percentiles based on other patients of the same demographic and age. So it will compare your risk to others similar to you. If you have a higher score >99 or higher percentile >75%tile, it is recommended to consider Statin cholesterol therapy and or referral to Cardiologist. I will try to help explain your results and if we have questions we can contact the Cardiologist.  You will be contacted for scheduling. Usually it is done at any imaging facility through [facility], [facility] or [facility].  The cost is $99 flat fee total and it does not go through insurance, so no authorization is required.  DUE for FASTING BLOOD WORK (no food or drink after midnight before the lab appointment, only water  or coffee without cream/sugar on the morning of)  SCHEDULE Lab Only visit in the  morning at the clinic for lab draw in 6   MONTHS   - Make sure Lab Only appointment is at about 1 week before your next appointment, so that results will be available  For Lab Results, once available within 2-3 days of blood draw, you can can log in to MyChart online to view your results and a brief explanation. Also, we can discuss results at next follow-up visit.    Please schedule a Follow-up Appointment to: Return for 6 month fasting lab > 1 week later Annual Physical.  If you have any other questions or concerns, please feel free to call the office or send a message through MyChart. You may also schedule an earlier appointment if necessary.  Additionally, you may be receiving a survey about your experience at our office within a few days to 1 week by e-mail or mail. We value your feedback.  Marsa Officer, DO [facility], [place]

## 2024-02-10 NOTE — Progress Notes (Signed)
 Subjective:    Patient ID: Jeff Ryan, male    DOB: 1946-06-11, 78 y.o.   MRN: [id number]  Jeff Ryan is a 78 y.o. male presenting on 02/10/2024 for Medical Management of Chronic Issues and Hypertension   HPI  Discussed the use of AI scribe software for clinical note transcription with the patient, who gave verbal consent to proceed.  History of Present Illness   Jeff Ryan is a 78 year old male with hypertension who presents with concerns about elevated blood pressure.  Hypertension - Elevated blood pressure to 170/100 mmHg for three days beginning July 3rd, 2025 - Required emergency room evaluation and four hours of monitoring; blood pressure improved during visit - Current antihypertensive regimen includes lisinopril  40 mg daily and metoprolol  twice daily - Recent home blood pressure reading of 132/84 mmHg - Monitors blood pressure regularly  Chronic kidney disease status post nephrectomy - History of left nephrectomy for nonfunctioning kidney - Single remaining kidney evaluated by urology with CT scan and cystoscopy after episode of hematuria - Urologist assessment: remaining kidney is healthy and functioning well  Atrial fibrillation and valvular heart disease - History of atrial fibrillation with recurrence shortly after cardioversion - On Xarelto  for anticoagulation - History of heart valve abnormality described as a 'hiccup' discovered in the early 1960s  Colorectal polyp surveillance - Colonoscopy performed after positive Cologuard test - Two small polyps removed during colonoscopy  Weight gain and glycemic control - Recent weight gain attributed to breakfast routine including apples and peanuts - Feels better at current weight compared to weight under 180 pounds post-nephrectomy - Recent blood glucose level of 5.7, lowest in four years       Atrial Fibrillation - resolved, Sinus Rhythm S/p Cardioversion 08/06/23  He recently underwent a  cardioversion last Friday and feels better since the procedure. He describes the process as quick and effective, with a sensation similar to a sunburn for the first two days post-procedure.  Doing well, he is on anticoagulation Xarelto  and Metoprolol  rate control   CHRONIC HYPERTENSION / CKD-II   s/p Left Nephrectomy, Kidney due to chronic Hydronephrosis with history of kidney stones Last lab Creatinine 1.14 improved from 1.4  Gross Hematuria 02/08/24 Dr Francisca performed cystoscopy, reassuring results.   Recent elevated BP readings SBP 170s early July 2025. He had 3 days of high BP and went to ED on 01/15/24 Due to stress from Gross Hematuria. Now has improved BP Following with [facility], Dr Dominica and has had good report.  Monitoring Home BP controlled. Current Meds - Lisinopril  40mg , Metoprolol  25mg  BID - OFF HCTZ Reports good compliance, took meds today. Tolerating well, w/o complaints.   Elevated A1c Overweight BMI >26 A1c results down to 5.9, prior 6.2 to 6.3 Improved lifestyle    Hyperlipidemia Controlled LDL 80s on Simvastatin  40mg    Epigastric Hernia S/p repair epigastric hernia     Insomnia Improved control on Trazodone  100mg  daily   Health Maintenance:  Cologuard positive 10/2023, he did diagnostic Colonoscopy 2 polyps removed, discontinued vs consider repeat 81-85 if interested.     02/10/2024    9:26 AM 08/12/2023    9:44 AM 05/04/2023    9:39 AM  Depression screen PHQ 2/9  Decreased Interest 0 0 0  Down, Depressed, Hopeless 0 0 0  PHQ - 2 Score 0 0 0  Altered sleeping   0  Tired, decreased energy   0  Change in appetite   0  Feeling bad or failure  about yourself    0  Trouble concentrating   0  Moving slowly or fidgety/restless   0  Suicidal thoughts   0  PHQ-9 Score   0       02/10/2024    9:26 AM 08/12/2023    9:44 AM 05/04/2023    9:39 AM 11/02/2022    8:37 AM  GAD 7 : Generalized Anxiety Score  Nervous, Anxious, on Edge 0 0 0 0   Control/stop worrying 0 0 0 0  Worry too much - different things 0 0 0 0  Trouble relaxing 0 0 0 0  Restless 0 0 0 0  Easily annoyed or irritable 0 0 0 0  Afraid - awful might happen 0 0 0 0  Total GAD 7 Score 0 0 0 0  Anxiety Difficulty  Not difficult at all      Social History   Tobacco Use   Smoking status: Former    Current packs/day: 0.00    Types: Cigarettes    Quit date: 01/11/2007    Years since quitting: 17.0   Smokeless tobacco: Never  Vaping Use   Vaping status: Never Used  Substance Use Topics   Alcohol use: Yes    Alcohol/week: 7.0 standard drinks of alcohol    Types: 7 Cans of beer per week   Drug use: No    Review of Systems Per HPI unless specifically indicated above     Objective:    BP 132/84 (BP Location: Left Arm, Patient Position: Sitting, Cuff Size: Normal)   Pulse 62   Ht 5' 11 (1.803 m)   Wt 200 lb 8 oz (90.9 kg)   BMI 27.96 kg/m   Wt Readings from Last 3 Encounters:  02/10/24 200 lb 8 oz (90.9 kg)  02/08/24 200 lb 6.4 oz (90.9 kg)  01/15/24 185 lb (83.9 kg)    Physical Exam Vitals and nursing note reviewed.  Constitutional:      General: He is not in acute distress.    Appearance: He is well-developed. He is not diaphoretic.     Comments: Well-appearing, comfortable, cooperative  HENT:     Head: Normocephalic and atraumatic.  Eyes:     General:        Right eye: No discharge.        Left eye: No discharge.     Conjunctiva/sclera: Conjunctivae normal.  Neck:     Thyroid : No thyromegaly.  Cardiovascular:     Rate and Rhythm: Normal rate and regular rhythm.     Pulses: Normal pulses.     Heart sounds: Normal heart sounds. No murmur heard. Pulmonary:     Effort: Pulmonary effort is normal. No respiratory distress.     Breath sounds: Normal breath sounds. No wheezing or rales.  Musculoskeletal:        General: Normal range of motion.     Cervical back: Normal range of motion and neck supple.  Lymphadenopathy:     Cervical: No  cervical adenopathy.  Skin:    General: Skin is warm and dry.     Findings: No erythema or rash.  Neurological:     Mental Status: He is alert and oriented to person, place, and time. Mental status is at baseline.  Psychiatric:        Behavior: Behavior normal.     Comments: Well groomed, good eye contact, normal speech and thoughts     Recent Labs    08/04/23 0836 02/10/24 0925  HGBA1C 5.9*  5.7*     Results for orders placed or performed in visit on 02/10/24  POCT HgB A1C   Collection Time: 02/10/24  9:25 AM  Result Value Ref Range   Hemoglobin A1C 5.7 (A) 4.0 - 5.6 %   HbA1c POC (<> result, manual entry)     HbA1c, POC (prediabetic range)     HbA1c, POC (controlled diabetic range)        Assessment & Plan:   Problem List Items Addressed This Visit     Benign hypertension with CKD (chronic kidney disease) stage III (HCC) - Primary   Elevated blood sugar   Relevant Orders   POCT HgB A1C (Completed)   Persistent atrial fibrillation (HCC)   Prediabetes   Other Visit Diagnoses       Atrophic kidney, acquired         Gross hematuria            Hypertension Hypertension with recent stress-induced elevation, now improved. Seems ED visit recently for high BP. Likely triggered episode. No other complications. Current medications include lisinopril  and metoprolol . - Monitor blood pressure regularly. - on Lisinopril  40mg  and Metoprolol  25mg  BID - Consider increasing metoprolol  if blood pressure remains elevated. - Avoid adding a third antihypertensive medication. - Contact clinic if blood pressure consistently reaches 170/100 mmHg.  Chronic kidney disease status post left nephrectomy Chronic kidney disease post-nephrectomy with good kidney function and no abnormalities on recent evaluations. - Followed by [facility] Dr Dominica - Continue regular follow-up with kidney specialist every six months.  Atrial fibrillation On Anticoagulation with Xarelto  and Metoprlol rate  control - Discuss heart scan for coronary artery evaluation.  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 - Monitor for symptoms related to heart valve abnormality.  Gross Hematuria history Managed by Urology, s/p cystoscopy  Colonic polyps, post-polypectomy Colonic polyps with recent removal of two small polyps, no further follow-up required unless desired at age 86.  Prediabetes Prediabetes with improved HbA1c from 5.9% to 5.7%. - Continue monitoring HbA1c levels.        Orders Placed This Encounter  Procedures   POCT HgB A1C    No orders of the defined types were placed in this encounter.   Follow up plan: Return for 6 month fasting lab > 1 week later Annual Physical.  Future labs ordered for 08/14/24  Marsa Officer, DO [facility] 02/10/2024, 9:21 AM

## 2024-03-12 ENCOUNTER — Encounter: Payer: Self-pay | Admitting: Pharmacist

## 2024-03-12 NOTE — Progress Notes (Signed)
   03/12/2024  Patient ID: Jeff Ryan, male   DOB: [birth year], 78 y.o.   MRN: [id number]  This patient is appearing on a report for the adherence measure for hypertension (ACEi/ARB) medications this calendar year.   Medication: lisinopril  40 mg Last fill date: 02/23/2024 for 90 day supply  Insurance report was not up to date. No action needed at this time.   Sharyle Sia, PharmD, [facility] [phone]

## 2024-03-21 ENCOUNTER — Ambulatory Visit: Attending: Cardiology | Admitting: Cardiology

## 2024-03-21 ENCOUNTER — Encounter: Payer: Self-pay | Admitting: Cardiology

## 2024-03-21 VITALS — BP 136/78 | HR 66 | Ht 71.0 in | Wt 202.6 lb

## 2024-03-21 DIAGNOSIS — E782 Mixed hyperlipidemia: Secondary | ICD-10-CM | POA: Diagnosis not present

## 2024-03-21 DIAGNOSIS — I4819 Other persistent atrial fibrillation: Secondary | ICD-10-CM | POA: Diagnosis not present

## 2024-03-21 DIAGNOSIS — I10 Essential (primary) hypertension: Secondary | ICD-10-CM | POA: Diagnosis not present

## 2024-03-21 DIAGNOSIS — N1831 Chronic kidney disease, stage 3a: Secondary | ICD-10-CM | POA: Diagnosis not present

## 2024-03-21 MED ORDER — METOPROLOL TARTRATE 25 MG PO TABS: 25.0000 mg | ORAL_TABLET | Freq: Two times a day (BID) | ORAL | 3 refills | Status: AC

## 2024-03-21 NOTE — Patient Instructions (Signed)
 Medication Instructions:  Your physician recommends that you continue on your current medications as directed. Please refer to the Current Medication list given to you today.  *If you need a refill on your cardiac medications before your next appointment, please call your pharmacy*  Lab Work: No labs ordered today  If you have labs (blood work) drawn today and your tests are completely normal, you will receive your results only by: MyChart Message (if you have MyChart) OR A paper copy in the mail If you have any lab test that is abnormal or we need to change your treatment, we will call you to review the results.  Testing/Procedures: No test ordered today   Follow-Up: At [facility], you and your health needs are our priority.  As part of our continuing mission to provide you with exceptional heart care, our providers are all part of one team.  This team includes your primary Cardiologist (physician) and Advanced Practice Providers or APPs (Physician Assistants and Nurse Practitioners) who all work together to provide you with the care you need, when you need it.  Your next appointment:   6 month(s)  Provider:   You may see Constancia Delton, MD or one of the following Advanced Practice Providers on your designated Care Team:   Laneta Pintos, NP Gildardo Labrador, PA-C Varney Gentleman, PA-C Cadence [place], PA-C Ronald Cockayne, NP Morey Ar, NP

## 2024-03-21 NOTE — Progress Notes (Signed)
 Cardiology Office Note   Date:  03/21/2024  ID:  Jeff Ryan, DOB [birth year], MRN [id number] PCP: [place] Marsa PARAS, DO  [facility] Providers Cardiologist:  Redell Cave, MD Electrophysiologist:  OLE ONEIDA HOLTS, MD     History of Present Illness Jeff Ryan is a 78 y.o. male with a past medical history of persistent atrial fibrillation, hypertension, hyperlipidemia, presents today for follow-up.   Prior echocardiogram completed by [facility] cardiology 03/2023 showed normal systolic function EF of 50-55%.  He had undergone a cardioversion procedure in 08/02/2023 for persistent atrial fibrillation with successful conversion to sinus rhythm after being shocked 1 time at 200 J.  He converted to normal sinus rhythm/sinus bradycardia with a rate of 52.  There were no postprocedure complications noted.  And he had been compliant with Eliquis and Lopressor  as prescribed.  Was referred to EP.   He was seen in clinic 09/30/2023 by Dr. HOLTS.  Was doing well and unaware of his A-fib.  Continue to remain with these numbers tomorrow.  He was tolerating apixaban without issues.  Asymptomatic with a normal EF on echocardiogram elected to proceed with rate control alone.   He was seen in clinic 12/20/2018 revealed overall well from a cardiac perspective.  Had been tolerating his medication without adverse side effects.  Denied any anginal symptoms or symptoms of decompensation.  He complains of persistent joint pain since being on apixaban and would like to change it to a different OAC he was changed to rivaroxaban  to determine if it would help with joint discomfort.  He was evaluated in the [facility] emergency department 01/15/2024 for further eval blood pressure.  Reported he had a CT scan done a few days ago was on his blood pressure was and should probably get that checked out.  Since then he been checking his blood pressure at home and noticed that it would run up to 180/100.  He had taken  his lisinopril  and metoprolol .  Last night he took extra half a dose of lisinopril  because blood pressure was elevated.  He had no symptoms.  Blood pressure in the emergency department was 172/100 with a pulse of 89, respirations 18, temperature 97.6.  Recheck of blood pressure revealed a pressure 130/75.  He was resting comfortably without distress.  Patient opted to continue to monitor pressures at home and follow-up with his primary care provider.  He returns to clinic today stating that he has been doing well from a cardiac perspective.  Denies any chest pain, shortness of breath peripheral edema.  States that since changing from apixaban to rivaroxaban  he is no longer muscle aches and pains.  He did have an issue with hematuria and was evaluated by urology and had a CT scan to rule out any kidney stones.  With that he was noted to have elevated blood pressure and then anxiety likely increased his blood pressure since that time it has been stable on his current medication regimen without any adverse side effects.  There have been no further issues of bleeding.  He denies any additional hospitalizations or visits to the emergency department.  ROS: 10 point review of systems has been reviewed and considered negative except ones are listed in the HPI  Studies Reviewed EKG Interpretation Date/Time:  Tuesday March 21 2024 09:19:36 EDT Ventricular Rate:  66 PR Interval:    QRS Duration:  70 QT Interval:  380 QTC Calculation: 398 R Axis:   -23  Text Interpretation: Atrial fibrillation When compared with ECG  of 15-Jan-2024 08:38, PREVIOUS ECG IS PRESENT No acute changes Confirmed by Gerard Frederick ([id number]) on 03/21/2024 9:26:37 AM    2D echo ([facility]) 04/01/2023 INTERPRETATION  NORMAL LEFT VENTRICULAR SYSTOLIC FUNCTION  NORMAL RIGHT VENTRICULAR SYSTOLIC FUNCTION  MILD VALVULAR REGURGITATION (See above)  NO VALVULAR STENOSIS  MILD mitral/tricuspid regurgitation  LVEF 50-55%  Risk  Assessment/Calculations  CHA2DS2-VASc Score = 3   This indicates a 3.2% annual risk of stroke. The patient's score is based upon: CHF History: 0 HTN History: 1 Diabetes History: 0 Stroke History: 0 Vascular Disease History: 0 Age Score: 2 Gender Score: 0            Physical Exam VS:  BP 136/78 (BP Location: Left Arm, Patient Position: Sitting, Cuff Size: Normal)   Pulse 66   Ht 5' 11 (1.803 m)   Wt 202 lb 9.6 oz (91.9 kg)   SpO2 99%   BMI 28.26 kg/m        Wt Readings from Last 3 Encounters:  03/21/24 202 lb 9.6 oz (91.9 kg)  02/10/24 200 lb 8 oz (90.9 kg)  02/08/24 200 lb 6.4 oz (90.9 kg)    GEN: Well nourished, well developed in no acute distress NECK: No JVD; No carotid bruits CARDIAC: IR IR, no murmurs, rubs, gallops RESPIRATORY:  Clear to auscultation without rales, wheezing or rhonchi  ABDOMEN: Soft, non-tender, non-distended EXTREMITIES:  No edema; No deformity   ASSESSMENT AND PLAN Persistent atrial fibrillation status post failed DC cardioversion 08/02/2023.  EKG today reveals rate controlled atrial fibrillation with a rate of 66 with no acute ischemic changes noted.  He is continued on metoprolol  25 mg twice daily for rate control and rivaroxaban  20 mg daily for CHA2DS2-VASc score of at least 3 for stroke prophylaxis.  Primary hypertension with blood pressure today 136/78.  Blood pressure has been fairly well-controlled.  He is continued on lisinopril  40 mg daily metoprolol  tartrate 25 mg twice daily.  He has been encouraged to continue to monitor his pressures 1 to 2 hours postmedication administration at home as well.  Mixed hyperlipidemia with last LDL 86.  He is continued on simvastatin  40 mg daily.  Ongoing management per his PCP.  Chronic kidney disease status post left nephrectomy with a stable serum creatinine 1.14.  He has been encouraged to avoid NSAIDs.       Dispo: Patient to return to clinic to see MD/APP in 6 months or sooner if needed for  further evaluation  Signed, Asuzena Weis, NP

## 2024-05-08 ENCOUNTER — Telehealth: Payer: Self-pay

## 2024-05-08 NOTE — Telephone Encounter (Signed)
 Shingles shots are covered under all medicare plans at the pharmacy usually $0.  If the shingles shot is given at the doctors office it would be billed and the cost can be up to $300. So we typically advise all medicare patients to go through the pharmacy for Shingles vaccines.  Technically some medicare advantage plans cover it through doctors office, but it is safer to go through pharmacy.  Marsa Officer, DO [facility] 05/08/2024, 10:39 AM

## 2024-05-08 NOTE — Telephone Encounter (Signed)
 Copied from CRM [id number]. Topic: Clinical - Medication Question >> May 08, 2024 10:32 AM Emylou G wrote: Reason for CRM: Patient interested in shingles shot.. Is it covered under medicare?  Can he do it the same day as flu shot tomorrow?  Give him call.

## 2024-05-08 NOTE — Telephone Encounter (Signed)
 Patient notified he will go to the pharmacy for the shingles vaccine

## 2024-05-09 ENCOUNTER — Ambulatory Visit (INDEPENDENT_AMBULATORY_CARE_PROVIDER_SITE_OTHER)

## 2024-05-09 DIAGNOSIS — Z23 Encounter for immunization: Secondary | ICD-10-CM

## 2024-07-11 ENCOUNTER — Other Ambulatory Visit: Payer: Self-pay | Admitting: Cardiology

## 2024-08-02 ENCOUNTER — Other Ambulatory Visit: Payer: Self-pay

## 2024-08-02 ENCOUNTER — Other Ambulatory Visit: Payer: Self-pay | Admitting: Family Medicine

## 2024-08-02 ENCOUNTER — Telehealth: Payer: Self-pay | Admitting: Family Medicine

## 2024-08-02 DIAGNOSIS — E7849 Other hyperlipidemia: Secondary | ICD-10-CM

## 2024-08-02 DIAGNOSIS — F5101 Primary insomnia: Secondary | ICD-10-CM

## 2024-08-02 DIAGNOSIS — I10 Essential (primary) hypertension: Secondary | ICD-10-CM

## 2024-08-02 MED ORDER — SIMVASTATIN 40 MG PO TABS: 40.0000 mg | ORAL_TABLET | Freq: Every day | ORAL | 1 refills | Status: DC

## 2024-08-02 MED ORDER — TRAZODONE HCL 100 MG PO TABS: 100.0000 mg | ORAL_TABLET | Freq: Every day | ORAL | 0 refills | Status: DC

## 2024-08-02 MED ORDER — SIMVASTATIN 40 MG PO TABS: 40.0000 mg | ORAL_TABLET | Freq: Every day | ORAL | 0 refills | Status: DC

## 2024-08-02 MED ORDER — LISINOPRIL 40 MG PO TABS: 40.0000 mg | ORAL_TABLET | Freq: Every day | ORAL | 0 refills | Status: DC

## 2024-08-02 NOTE — Telephone Encounter (Signed)
 Requested Prescriptions  Pending Prescriptions Disp Refills   lisinopril  (ZESTRIL ) 40 MG tablet 30 tablet 0    Sig: Take 1 tablet (40 mg total) by mouth daily.     Cardiovascular:  ACE Inhibitors Failed - 08/02/2024 10:17 AM      Failed - Cr in normal range and within 180 days    Creat  Date Value Ref Range Status  08/04/2023 1.40 (H) 0.70 - 1.28 mg/dL Final   Creatinine, Ser  Date Value Ref Range Status  01/15/2024 1.14 0.61 - 1.24 mg/dL Final         Failed - K in normal range and within 180 days    Potassium  Date Value Ref Range Status  01/15/2024 3.7 3.5 - 5.1 mmol/L Final         Passed - Patient is not pregnant      Passed - Last BP in normal range    BP Readings from Last 1 Encounters:  03/21/24 136/78         Passed - Valid encounter within last 6 months    Recent Outpatient Visits           5 months ago Benign hypertension with CKD (chronic kidney disease) stage III [facility])   [place] [facility] [place] Marsa PARAS, DO       Future Appointments             In 6 months Maurine [facility]ukes, PA-C [facility]             simvastatin  (ZOCOR ) 40 MG tablet 30 tablet 0    Sig: Take 1 tablet (40 mg total) by mouth daily with supper.     Cardiovascular:  Antilipid - Statins Failed - 08/02/2024 10:17 AM      Failed - Lipid Panel in normal range within the last 12 months    Cholesterol, Total  Date Value Ref Range Status  04/15/2015 144 100 - 199 mg/dL Final   Cholesterol  Date Value Ref Range Status  08/04/2023 162 <200 mg/dL Final   LDL Cholesterol (Calc)  Date Value Ref Range Status  08/04/2023 86 mg/dL (calc) Final    Comment:    Reference range: <100 . Desirable range <100 mg/dL for primary prevention;   <70 mg/dL for patients with CHD or diabetic patients  with > or = 2 CHD risk factors. SABRA LDL-C is now calculated using the Martin-Hopkins  calculation, which is a validated novel method providing   better accuracy than the Friedewald equation in the  estimation of LDL-C.  Gladis APPLETHWAITE et al. SANDREA. 7986;689(80): 2061-2068  (http://education.[employer].com/faq/FAQ164)    HDL  Date Value Ref Range Status  08/04/2023 61 > OR = 40 mg/dL Final  89/96/7983 63 >60 mg/dL Final    Comment:    According to ATP-III Guidelines, HDL-C >59 mg/dL is considered a negative risk factor for CHD.    Triglycerides  Date Value Ref Range Status  08/04/2023 67 <150 mg/dL Final         Passed - Patient is not pregnant      Passed - Valid encounter within last 12 months    Recent Outpatient Visits           5 months ago Benign hypertension with CKD (chronic kidney disease) stage III [facility])    [place] [facility] [place] Marsa PARAS, DO       Future Appointments  In 6 months Vaillancourt, Samantha, PA-C [facility] [place]             traZODone  (DESYREL ) 100 MG tablet 30 tablet 0    Sig: Take 1 tablet (100 mg total) by mouth at bedtime.     Psychiatry: Antidepressants - Serotonin Modulator Passed - 08/02/2024 10:17 AM      Passed - Valid encounter within last 6 months    Recent Outpatient Visits           5 months ago Benign hypertension with CKD (chronic kidney disease) stage III [facility])   [place] [facility] [place], Marsa PARAS, DO       Future Appointments             In 6 months Vaillancourt, Samantha, PA-C [facility] [place]

## 2024-08-02 NOTE — Telephone Encounter (Signed)
 [facility] called and spoke to [place], [facility] about the refill(s) requested. Advised it was sent on 08/02/24 #30/0 refill(s) due to labs will be due in a couple of days, so only a 30 days supply was sent in until the labs are done in 2 weeks. Sam verbalized understanding.    Copied from CRM #[id number]. Topic: Clinical - Prescription Issue >> Aug 02, 2024 11:16 AM Willma R wrote: Reason for CRM: Sam from [facility] drug calling in regards to the prescription for simvastatin  (ZOCOR ) 40 MG tablet. States the notes says 1 year supply but quantity is 30 and no refills. Is asking for the prescription to updated and resent, patient is asking for a 90 day supply.  Sam can be reached at [phone]

## 2024-08-02 NOTE — Telephone Encounter (Signed)
 Copied from CRM #[id number]. Topic: Clinical - Medication Refill >> Aug 02, 2024  9:34 AM Annabella B wrote: Patient changed pharmacy due to new insurance. Pharmacy states patient is upset because his new scripts have not be sent to his new pharmacy. Pharmacy requesting the below request to be expedited;   Medication:  lisinopril  (ZESTRIL ) 40 MG tablet,  simvastatin  (ZOCOR ) 40 MG tablet,  traZODone  (DESYREL ) 100 MG tablet  Has the patient contacted their pharmacy? Yes   This is the patient's preferred pharmacy:  [facility] [place], [place] - [address]  [phone]  Is this the correct pharmacy for this prescription? Yes   Has the prescription been filled recently? Yes  Is the patient out of the medication? No  Has the patient been seen for an appointment in the last year OR does the patient have an upcoming appointment? Yes  Can we respond through MyChart? No  Agent: Please be advised that Rx refills may take up to 3 business days. We ask that you follow-up with your pharmacy.

## 2024-08-02 NOTE — Telephone Encounter (Signed)
 New prescription sent in to pharmacy

## 2024-08-11 ENCOUNTER — Other Ambulatory Visit

## 2024-08-11 DIAGNOSIS — E782 Mixed hyperlipidemia: Secondary | ICD-10-CM

## 2024-08-11 DIAGNOSIS — I4819 Other persistent atrial fibrillation: Secondary | ICD-10-CM

## 2024-08-11 DIAGNOSIS — R7303 Prediabetes: Secondary | ICD-10-CM

## 2024-08-11 DIAGNOSIS — I129 Hypertensive chronic kidney disease with stage 1 through stage 4 chronic kidney disease, or unspecified chronic kidney disease: Secondary | ICD-10-CM

## 2024-08-11 DIAGNOSIS — N261 Atrophy of kidney (terminal): Secondary | ICD-10-CM

## 2024-08-11 DIAGNOSIS — R351 Nocturia: Secondary | ICD-10-CM

## 2024-08-11 DIAGNOSIS — Z0000 Encounter for general adult medical examination without abnormal findings: Secondary | ICD-10-CM

## 2024-08-12 LAB — COMPREHENSIVE METABOLIC PANEL WITH GFR
AG Ratio: 1.5 (calc) (ref 1.0–2.5)
ALT: 15 U/L (ref 9–46)
AST: 16 U/L (ref 10–35)
Albumin: 4.3 g/dL (ref 3.6–5.1)
Alkaline phosphatase (APISO): 57 U/L (ref 35–144)
BUN: 21 mg/dL (ref 7–25)
CO2: 31 mmol/L (ref 20–32)
Calcium: 9.4 mg/dL (ref 8.6–10.3)
Chloride: 100 mmol/L (ref 98–110)
Creat: 1.18 mg/dL (ref 0.70–1.28)
Globulin: 2.8 g/dL (ref 1.9–3.7)
Glucose, Bld: 104 mg/dL — ABNORMAL HIGH (ref 65–99)
Potassium: 5.1 mmol/L (ref 3.5–5.3)
Sodium: 137 mmol/L (ref 135–146)
Total Bilirubin: 0.9 mg/dL (ref 0.2–1.2)
Total Protein: 7.1 g/dL (ref 6.1–8.1)
eGFR: 63 mL/min/{1.73_m2}

## 2024-08-12 LAB — PSA: PSA: 0.32 ng/mL

## 2024-08-12 LAB — CBC WITH DIFFERENTIAL/PLATELET
Absolute Lymphocytes: 2464 {cells}/uL (ref 850–3900)
Absolute Monocytes: 667 {cells}/uL (ref 200–950)
Basophils Absolute: 50 {cells}/uL (ref 0–200)
Basophils Relative: 0.7 %
Eosinophils Absolute: 99 {cells}/uL (ref 15–500)
Eosinophils Relative: 1.4 %
HCT: 45.1 % (ref 39.4–51.1)
Hemoglobin: 14.6 g/dL (ref 13.2–17.1)
MCH: 30.9 pg (ref 27.0–33.0)
MCHC: 32.4 g/dL (ref 31.6–35.4)
MCV: 95.6 fL (ref 81.4–101.7)
MPV: 10.5 fL (ref 7.5–12.5)
Monocytes Relative: 9.4 %
Neutro Abs: 3820 {cells}/uL (ref 1500–7800)
Neutrophils Relative %: 53.8 %
Platelets: 287 10*3/uL (ref 140–400)
RBC: 4.72 Million/uL (ref 4.20–5.80)
RDW: 12.7 % (ref 11.0–15.0)
Total Lymphocyte: 34.7 %
WBC: 7.1 10*3/uL (ref 3.8–10.8)

## 2024-08-12 LAB — LIPID PANEL
Cholesterol: 152 mg/dL
HDL: 58 mg/dL
LDL Cholesterol (Calc): 76 mg/dL
Non-HDL Cholesterol (Calc): 94 mg/dL
Total CHOL/HDL Ratio: 2.6 (calc)
Triglycerides: 95 mg/dL

## 2024-08-12 LAB — HEMOGLOBIN A1C
Hgb A1c MFr Bld: 5.7 % — ABNORMAL HIGH
Mean Plasma Glucose: 117 mg/dL
eAG (mmol/L): 6.5 mmol/L

## 2024-08-14 ENCOUNTER — Other Ambulatory Visit

## 2024-08-18 ENCOUNTER — Encounter: Payer: Self-pay | Admitting: Family Medicine

## 2024-08-18 ENCOUNTER — Ambulatory Visit (INDEPENDENT_AMBULATORY_CARE_PROVIDER_SITE_OTHER): Admitting: Family Medicine

## 2024-08-18 VITALS — BP 134/76 | HR 62 | Ht 71.0 in | Wt 220.0 lb

## 2024-08-18 DIAGNOSIS — E7849 Other hyperlipidemia: Secondary | ICD-10-CM

## 2024-08-18 DIAGNOSIS — I10 Essential (primary) hypertension: Secondary | ICD-10-CM

## 2024-08-18 DIAGNOSIS — F5101 Primary insomnia: Secondary | ICD-10-CM

## 2024-08-18 DIAGNOSIS — R7303 Prediabetes: Secondary | ICD-10-CM

## 2024-08-18 DIAGNOSIS — I4819 Other persistent atrial fibrillation: Secondary | ICD-10-CM

## 2024-08-18 DIAGNOSIS — I129 Hypertensive chronic kidney disease with stage 1 through stage 4 chronic kidney disease, or unspecified chronic kidney disease: Secondary | ICD-10-CM

## 2024-08-18 DIAGNOSIS — Z0000 Encounter for general adult medical examination without abnormal findings: Secondary | ICD-10-CM

## 2024-08-18 DIAGNOSIS — N182 Chronic kidney disease, stage 2 (mild): Secondary | ICD-10-CM

## 2024-08-18 MED ORDER — TRAZODONE HCL 100 MG PO TABS: 100.0000 mg | ORAL_TABLET | Freq: Every day | ORAL | 3 refills | Status: AC

## 2024-08-18 MED ORDER — LISINOPRIL 40 MG PO TABS: 40.0000 mg | ORAL_TABLET | Freq: Every day | ORAL | 3 refills | Status: AC

## 2024-08-18 MED ORDER — SIMVASTATIN 40 MG PO TABS: 40.0000 mg | ORAL_TABLET | Freq: Every day | ORAL | 3 refills | Status: AC

## 2024-08-18 NOTE — Patient Instructions (Addendum)
 Thank you for coming to the office today.  Refills sent to [facility] 90 day with refills  BP repeat improved on manual check  Goal to keep improving weight and resume exercise and activity  Labs look good overall  Recent Labs    02/10/24 0925 08/11/24 0919  HGBA1C 5.7* 5.7*   Sugar and cholesterol in good range overall.  Please schedule a Follow-up Appointment to: Return for 6 month PreDM A1c, HTN, CKD.  If you have any other questions or concerns, please feel free to call the office or send a message through MyChart. You may also schedule an earlier appointment if necessary.  Additionally, you may be receiving a survey about your experience at our office within a few days to 1 week by e-mail or mail. We value your feedback.  Marsa Officer, DO [facility] [place], [place]

## 2024-08-18 NOTE — Progress Notes (Signed)
 "  Subjective:    Patient ID: Jeff Ryan, male    DOB: 05/31/1946, 79 y.o.   MRN: [id number]  Jeff Ryan is a 79 y.o. male presenting on 08/18/2024 for Annual Exam   HPI  Discussed the use of AI scribe software for clinical note transcription with the patient, who gave verbal consent to proceed.  History of Present Illness   Jeff Ryan is a 79 year old male who presents for an annual physical exam.    Permanent Atrial Fibrillation S/p Cardioversion 08/06/23  Followed by Cardiology Doing well, he is on anticoagulation Xarelto  and Metoprolol  rate control    CHRONIC HYPERTENSION / CKD-II   s/p Left Nephrectomy, Kidney due to chronic Hydronephrosis with history of kidney stones Last lab Creatinine 1.18 improved, stable, eGFR 63 with 1 kidney improved now  Following with [facility], Dr Dominica and has had good report lately  Monitoring Home BP controlled. Current Meds - Lisinopril  40mg , Metoprolol  25mg  BID - OFF HCTZ Reports good compliance, took meds today. Tolerating well, w/o complaints.   Elevated A1c Obesity BMI >30 A1c 5.7, previously 5.7 to 5.9. Past up to 6.2-6.3 Admits weight gain+20 lbs in past 6 months, sedentary with winter weather    Hyperlipidemia Controlled LDL 76 on Simvastatin  40mg    Insomnia Well controlled on Trazodone  100mg  daily     Health Maintenance:  PSA 0.32 negative   Cologuard positive 10/2023, he did diagnostic Colonoscopy 2 polyps removed, discontinued vs consider repeat 81-85 if interested.   Shingles vaccine 06/2024 1st dose done, waiting on repeat.     08/18/2024    9:26 AM 02/10/2024    9:26 AM 08/12/2023    9:44 AM  Depression screen PHQ 2/9  Decreased Interest 0 0 0  Down, Depressed, Hopeless 0 0 0  PHQ - 2 Score 0 0 0       08/18/2024    9:26 AM 02/10/2024    9:26 AM 08/12/2023    9:44 AM 05/04/2023    9:39 AM  GAD 7 : Generalized Anxiety Score  Nervous, Anxious, on Edge 0 0  0  0   Control/stop  worrying 0 0  0  0   Worry too much - different things 0 0  0  0   Trouble relaxing 0 0  0  0   Restless 0 0  0  0   Easily annoyed or irritable 0 0  0  0   Afraid - awful might happen 0 0  0  0   Total GAD 7 Score 0 0 0 0  Anxiety Difficulty   Not difficult at all      Data saved with a previous flowsheet row definition     Past Medical History:  Diagnosis Date   Aortic atherosclerosis    Atrial fibrillation with slow ventricular response (HCC) 03/26/2023   a.) noted on preop ECG 03/26/2023; b.) CHA2DS2-VASc: 4 (age x 2, HTN, vascular disease history); rate/rhythm maintained on oral metoprolol  tartrate; chronically anticoagulated using standard dose apixaban (will start DOAC postop once cleared by surgery on 04/05/2023).   Atrophy of kidney    Dyslipidemia    Epigastric hernia    Essential hypertension    Insomnia    Nephrolithiasis    On apixaban therapy 04/01/2023   Pre-diabetes    Systolic murmur 04/01/2023   a.) grade II/VI; mitral area   Umbilical hernia    Past Surgical History:  Procedure Laterality Date   CARDIOVERSION N/A 08/06/2023  Procedure: CARDIOVERSION;  Surgeon: Darliss Rogue, MD;  Location: [facility] ORS;  Service: Cardiovascular;  Laterality: N/A;   CATARACT EXTRACTION, BILATERAL     left 10/26/2014 right 10/12/2014   COLONOSCOPY N/A 11/22/2023   Procedure: COLONOSCOPY;  Surgeon: Onita Elspeth Sharper, DO;  Location: [facility] ENDOSCOPY;  Service: Gastroenterology;  Laterality: N/A;   EPIGASTRIC HERNIA REPAIR N/A 04/05/2023   Procedure: HERNIA REPAIR EPIGASTRIC ADULT;  Surgeon: Lane Shope, MD;  Location: [facility] ORS;  Service: General;  Laterality: N/A;   POLYPECTOMY  11/22/2023   Procedure: POLYPECTOMY, INTESTINE;  Surgeon: Onita Elspeth Sharper, DO;  Location: [facility] ENDOSCOPY;  Service: Gastroenterology;;   ROBOT ASSISTED LAPAROSCOPIC NEPHRECTOMY Left 04/05/2023   Procedure: XI ROBOTIC ASSISTED LAPAROSCOPIC NEPHRECTOMY;  Surgeon: Penne Knee, MD;  Location:  [facility] ORS;  Service: Urology;  Laterality: Left;   TONSILLECTOMY AND ADENOIDECTOMY     US  ECHOCARDIOGRAPHY     10/2014   VASECTOMY     Social History   Socioeconomic History   Marital status: Married    Spouse name: Not on file   Number of children: Not on file   Years of education: Not on file   Highest education level: Associate degree: occupational, scientist, product/process development, or vocational program  Occupational History   Occupation: Retired Production Assistant, Radio)    Comment: Company in [place], traveled around world, retired age 89  Tobacco Use   Smoking status: Former    Current packs/day: 0.00    Types: Cigarettes    Quit date: 01/11/2007    Years since quitting: 17.6   Smokeless tobacco: Never  Vaping Use   Vaping status: Never Used  Substance and Sexual Activity   Alcohol use: Yes    Alcohol/week: 7.0 standard drinks of alcohol    Types: 7 Cans of beer per week   Drug use: No   Sexual activity: Not Currently  Other Topics Concern   Not on file  Social History Narrative   Not on file   Social Drivers of Health   Tobacco Use: Medium Risk (08/18/2024)   Patient History    Smoking Tobacco Use: Former    Smokeless Tobacco Use: Never    Passive Exposure: Not on file  Financial Resource Strain: Low Risk (08/17/2024)   Overall Financial Resource Strain (CARDIA)    Difficulty of Paying Living Expenses: Not very hard  Food Insecurity: No Food Insecurity (08/17/2024)   Epic    Worried About Radiation Protection Practitioner of Food in the Last Year: Never true    Ran Out of Food in the Last Year: Never true  Transportation Needs: No Transportation Needs (08/17/2024)   Epic    Lack of Transportation (Medical): No    Lack of Transportation (Non-Medical): No  Physical Activity: Inactive (08/17/2024)   Exercise Vital Sign    Days of Exercise per Week: 0 days    Minutes of Exercise per Session: Not on file  Stress: No Stress Concern Present (08/17/2024)   [employer] of Occupational Health - Occupational Stress  Questionnaire    Feeling of Stress: Not at all  Social Connections: Moderately Isolated (08/17/2024)   Social Connection and Isolation Panel    Frequency of Communication with Friends and Family: More than three times a week    Frequency of Social Gatherings with Friends and Family: Never    Attends Religious Services: Patient declined    Database Administrator or Organizations: No    Attends Engineer, Structural: Not on file    Marital Status: Married  Intimate  Partner Violence: Not on file  Depression (PHQ2-9): Low Risk (08/18/2024)   Depression (PHQ2-9)    PHQ-2 Score: 0  Alcohol Screen: Low Risk (02/10/2024)   Alcohol Screen    Last Alcohol Screening Score (AUDIT): 3  Housing: Low Risk (02/10/2024)   Epic    Unable to Pay for Housing in the Last Year: No    Number of Times Moved in the Last Year: 0    Homeless in the Last Year: No  Utilities: Not At Risk (11/09/2023)   Received from [facility] Utilities    Threatened with loss of utilities: No  Health Literacy: Not on file   Family History  Problem Relation Age of Onset   Heart disease Brother    Heart attack Brother        x 3   Prostate cancer Neg Hx    Colon cancer Neg Hx    Current Outpatient Medications on File Prior to Visit  Medication Sig   capsaicin (ZOSTRIX) 0.025 % cream Apply 1 Application topically 2 (two) times daily as needed (arthritis pain.).   metoprolol  tartrate (LOPRESSOR ) 25 MG tablet Take 1 tablet (25 mg total) by mouth 2 (two) times daily.   rivaroxaban  (XARELTO ) 20 MG TABS tablet Take 1 tablet (20 mg total) by mouth daily with supper.   No current facility-administered medications on file prior to visit.    Review of Systems  Constitutional:  Negative for activity change, appetite change, chills, diaphoresis, fatigue and fever.  HENT:  Negative for congestion and hearing loss.   Eyes:  Negative for visual disturbance.  Respiratory:  Negative for cough, chest  tightness, shortness of breath and wheezing.   Cardiovascular:  Negative for chest pain, palpitations and leg swelling.  Gastrointestinal:  Negative for abdominal pain, constipation, diarrhea, nausea and vomiting.  Genitourinary:  Negative for dysuria, frequency and hematuria.  Musculoskeletal:  Negative for arthralgias and neck pain.  Skin:  Negative for rash.  Neurological:  Negative for dizziness, weakness, light-headedness, numbness and headaches.  Hematological:  Negative for adenopathy.  Psychiatric/Behavioral:  Negative for behavioral problems, dysphoric mood and sleep disturbance.    Per HPI unless specifically indicated above     Objective:    BP 134/76 (BP Location: Left Arm, Cuff Size: Normal)   Pulse 62   Ht 5' 11 (1.803 m)   Wt 220 lb (99.8 kg)   BMI 30.68 kg/m   Wt Readings from Last 3 Encounters:  08/18/24 220 lb (99.8 kg)  03/21/24 202 lb 9.6 oz (91.9 kg)  02/10/24 200 lb 8 oz (90.9 kg)    Physical Exam Vitals and nursing note reviewed.  Constitutional:      General: He is not in acute distress.    Appearance: He is well-developed. He is not diaphoretic.     Comments: Well-appearing, comfortable, cooperative  HENT:     Head: Normocephalic and atraumatic.  Eyes:     General:        Right eye: No discharge.        Left eye: No discharge.     Conjunctiva/sclera: Conjunctivae normal.     Pupils: Pupils are equal, round, and reactive to light.  Neck:     Thyroid : No thyromegaly.  Cardiovascular:     Rate and Rhythm: Normal rate. Rhythm irregular.     Pulses: Normal pulses.     Heart sounds: Normal heart sounds. No murmur heard. Pulmonary:     Effort: Pulmonary effort is  normal. No respiratory distress.     Breath sounds: Normal breath sounds. No wheezing or rales.  Abdominal:     General: Bowel sounds are normal. There is no distension.     Palpations: Abdomen is soft. There is no mass.     Tenderness: There is no abdominal tenderness.   Musculoskeletal:        General: No tenderness. Normal range of motion.     Cervical back: Normal range of motion and neck supple.     Comments: Upper / Lower Extremities: - Normal muscle tone, strength bilateral upper extremities 5/5, lower extremities 5/5  Lymphadenopathy:     Cervical: No cervical adenopathy.  Skin:    General: Skin is warm and dry.     Findings: No erythema or rash.  Neurological:     Mental Status: He is alert and oriented to person, place, and time.     Comments: Distal sensation intact to light touch all extremities  Psychiatric:        Mood and Affect: Mood normal.        Behavior: Behavior normal.        Thought Content: Thought content normal.     Comments: Well groomed, good eye contact, normal speech and thoughts     Results for orders placed or performed in visit on 08/11/24  Comprehensive metabolic panel with GFR   Collection Time: 08/11/24  9:19 AM  Result Value Ref Range   Glucose, Bld 104 (H) 65 - 99 mg/dL   BUN 21 7 - 25 mg/dL   Creat 8.81 9.29 - 8.71 mg/dL   eGFR 63 > OR = 60 fO/fpw/8.26f7   BUN/Creatinine Ratio SEE NOTE: 6 - 22 (calc)   Sodium 137 135 - 146 mmol/L   Potassium 5.1 3.5 - 5.3 mmol/L   Chloride 100 98 - 110 mmol/L   CO2 31 20 - 32 mmol/L   Calcium  9.4 8.6 - 10.3 mg/dL   Total Protein 7.1 6.1 - 8.1 g/dL   Albumin 4.3 3.6 - 5.1 g/dL   Globulin 2.8 1.9 - 3.7 g/dL (calc)   AG Ratio 1.5 1.0 - 2.5 (calc)   Total Bilirubin 0.9 0.2 - 1.2 mg/dL   Alkaline phosphatase (APISO) 57 35 - 144 U/L   AST 16 10 - 35 U/L   ALT 15 9 - 46 U/L  PSA   Collection Time: 08/11/24  9:19 AM  Result Value Ref Range   PSA 0.32 < OR = 4.00 ng/mL  CBC with Differential/Platelet   Collection Time: 08/11/24  9:19 AM  Result Value Ref Range   WBC 7.1 3.8 - 10.8 Thousand/uL   RBC 4.72 4.20 - 5.80 Million/uL   Hemoglobin 14.6 13.2 - 17.1 g/dL   HCT 54.8 60.5 - 48.8 %   MCV 95.6 81.4 - 101.7 fL   MCH 30.9 27.0 - 33.0 pg   MCHC 32.4 31.6 - 35.4  g/dL   RDW 87.2 88.9 - 84.9 %   Platelets 287 140 - 400 Thousand/uL   MPV 10.5 7.5 - 12.5 fL   Neutro Abs 3,820 1,500 - 7,800 cells/uL   Absolute Lymphocytes 2,464 850 - 3,900 cells/uL   Absolute Monocytes 667 200 - 950 cells/uL   Eosinophils Absolute 99 15 - 500 cells/uL   Basophils Absolute 50 0 - 200 cells/uL   Neutrophils Relative % 53.8 %   Total Lymphocyte 34.7 %   Monocytes Relative 9.4 %   Eosinophils Relative 1.4 %   Basophils Relative  0.7 %  Hemoglobin A1c   Collection Time: 08/11/24  9:19 AM  Result Value Ref Range   Hgb A1c MFr Bld 5.7 (H) <5.7 %   Mean Plasma Glucose 117 mg/dL   eAG (mmol/L) 6.5 mmol/L  Lipid panel   Collection Time: 08/11/24  9:19 AM  Result Value Ref Range   Cholesterol 152 <200 mg/dL   HDL 58 > OR = 40 mg/dL   Triglycerides 95 <849 mg/dL   LDL Cholesterol (Calc) 76 mg/dL (calc)   Total CHOL/HDL Ratio 2.6 <5.0 (calc)   Non-HDL Cholesterol (Calc) 94 <869 mg/dL (calc)      Assessment & Plan:   Problem List Items Addressed This Visit     Benign hypertension with CKD (chronic kidney disease) stage III (HCC)   Relevant Medications   lisinopril  (ZESTRIL ) 40 MG tablet   simvastatin  (ZOCOR ) 40 MG tablet   Hyperlipidemia   Relevant Medications   lisinopril  (ZESTRIL ) 40 MG tablet   simvastatin  (ZOCOR ) 40 MG tablet   Persistent atrial fibrillation (HCC)   Relevant Medications   lisinopril  (ZESTRIL ) 40 MG tablet   simvastatin  (ZOCOR ) 40 MG tablet   Prediabetes   Primary insomnia   Relevant Medications   traZODone  (DESYREL ) 100 MG tablet   Other Visit Diagnoses       Annual physical exam    -  Primary     Essential hypertension       Relevant Medications   lisinopril  (ZESTRIL ) 40 MG tablet   simvastatin  (ZOCOR ) 40 MG tablet        Updated Health Maintenance information Reviewed recent lab results with patient Encouraged improvement to lifestyle with diet and exercise Goal of weight loss   Adult Wellness Visit Annual wellness  visit conducted. Weight increased to 220 lbs. Blood pressure elevated at 150/90 mmHg. Cholesterol levels well-controlled. A1c stable. Glucose level indicates early prediabetes. Kidney function stable. PSA normal. - Continue current medications for cholesterol and blood pressure. - Encouraged lifestyle modifications for weight and blood pressure management. - Scheduled follow-up in six months.  Hypertensive chronic kidney disease stage II Chronic kidney disease stage II with stable kidney function now improved from prior CKD IIIa - Continue current antihypertensive regimen. Lisinopril  40mg  daily, Metoprolol  25mg  TWICE A DAY - Monitor kidney function regularly.  Other hyperlipidemia Cholesterol levels well-controlled with current medication regimen. At goal LDL 76 - Continue current lipid-lowering therapy. Simvastatin  40mg  daily  Prediabetes Glucose level indicates early prediabetes. A1c stable. - Encouraged lifestyle modifications for weight and glucose management.  Persistent atrial fibrillation Followed by Cardiology Atrial fibrillation may interfere with digital blood pressure monitors On anticoagulation Xarelto  20mg  daily, on rate control metoprolol   Primary insomnia Stable on Trazodone  100mg  nightly       No orders of the defined types were placed in this encounter.   Meds ordered this encounter  Medications   lisinopril  (ZESTRIL ) 40 MG tablet    Sig: Take 1 tablet (40 mg total) by mouth daily.    Dispense:  90 tablet    Refill:  3    Requesting 1 year supply   simvastatin  (ZOCOR ) 40 MG tablet    Sig: Take 1 tablet (40 mg total) by mouth daily with supper.    Dispense:  90 tablet    Refill:  3    Request 1 year supply   traZODone  (DESYREL ) 100 MG tablet    Sig: Take 1 tablet (100 mg total) by mouth at bedtime.    Dispense:  90 tablet  Refill:  3    Request 1 year supply     Follow up plan: Return for 6 month PreDM A1c, HTN, CKD.  Marsa Officer,  DO [facility] 08/18/2024, 9:49 AM  "

## 2025-02-07 ENCOUNTER — Ambulatory Visit: Admitting: Physician Assistant

## 2025-02-15 ENCOUNTER — Ambulatory Visit: Admitting: Family Medicine
